# Patient Record
Sex: Female | Born: 1935 | Race: White | Hispanic: No | Marital: Married | Smoking: Never smoker
Health system: Southern US, Community
[De-identification: ages and names within clinical notes are randomized; demographics above are authoritative.]

## PROBLEM LIST (undated history)

## (undated) DIAGNOSIS — R519 Headache, unspecified: Secondary | ICD-10-CM

## (undated) DIAGNOSIS — M1990 Unspecified osteoarthritis, unspecified site: Secondary | ICD-10-CM

## (undated) DIAGNOSIS — M8580 Other specified disorders of bone density and structure, unspecified site: Secondary | ICD-10-CM

## (undated) DIAGNOSIS — R51 Headache: Secondary | ICD-10-CM

## (undated) DIAGNOSIS — R011 Cardiac murmur, unspecified: Secondary | ICD-10-CM

## (undated) DIAGNOSIS — E785 Hyperlipidemia, unspecified: Secondary | ICD-10-CM

## (undated) DIAGNOSIS — N6099 Unspecified benign mammary dysplasia of unspecified breast: Secondary | ICD-10-CM

## (undated) DIAGNOSIS — M316 Other giant cell arteritis: Secondary | ICD-10-CM

## (undated) HISTORY — PX: COLONOSCOPY: SHX174

## (undated) HISTORY — DX: Unspecified osteoarthritis, unspecified site: M19.90

## (undated) HISTORY — DX: Headache, unspecified: R51.9

## (undated) HISTORY — DX: Other specified disorders of bone density and structure, unspecified site: M85.80

## (undated) HISTORY — PX: CATARACT EXTRACTION: SUR2

## (undated) HISTORY — PX: EYE SURGERY: SHX253

## (undated) HISTORY — DX: Hyperlipidemia, unspecified: E78.5

## (undated) HISTORY — PX: FRACTURE SURGERY: SHX138

## (undated) HISTORY — DX: Headache: R51

## (undated) HISTORY — PX: TOTAL KNEE ARTHROPLASTY: SHX125

## (undated) HISTORY — DX: Unspecified benign mammary dysplasia of unspecified breast: N60.99

## (undated) HISTORY — DX: Cardiac murmur, unspecified: R01.1

---

## 1960-11-06 HISTORY — PX: APPENDECTOMY: SHX54

## 1998-11-06 DIAGNOSIS — N6099 Unspecified benign mammary dysplasia of unspecified breast: Secondary | ICD-10-CM

## 1998-11-06 HISTORY — DX: Unspecified benign mammary dysplasia of unspecified breast: N60.99

## 1998-11-06 HISTORY — PX: BREAST BIOPSY: SHX20

## 1999-01-10 ENCOUNTER — Other Ambulatory Visit: Admission: RE | Admit: 1999-01-10 | Discharge: 1999-01-10 | Payer: Self-pay | Admitting: Nurse Practitioner

## 1999-02-17 ENCOUNTER — Ambulatory Visit (HOSPITAL_BASED_OUTPATIENT_CLINIC_OR_DEPARTMENT_OTHER): Admission: RE | Admit: 1999-02-17 | Discharge: 1999-02-17 | Payer: Self-pay

## 1999-11-07 HISTORY — PX: JOINT REPLACEMENT: SHX530

## 2000-02-23 ENCOUNTER — Other Ambulatory Visit: Admission: RE | Admit: 2000-02-23 | Discharge: 2000-02-23 | Payer: Self-pay | Admitting: *Deleted

## 2000-08-01 ENCOUNTER — Encounter: Admission: RE | Admit: 2000-08-01 | Discharge: 2000-08-01 | Payer: Self-pay

## 2001-05-23 ENCOUNTER — Other Ambulatory Visit: Admission: RE | Admit: 2001-05-23 | Discharge: 2001-05-23 | Payer: Self-pay | Admitting: *Deleted

## 2001-07-03 ENCOUNTER — Encounter: Payer: Self-pay | Admitting: Orthopedic Surgery

## 2001-07-05 ENCOUNTER — Encounter: Payer: Self-pay | Admitting: Orthopedic Surgery

## 2001-07-06 ENCOUNTER — Inpatient Hospital Stay (HOSPITAL_COMMUNITY): Admission: RE | Admit: 2001-07-06 | Discharge: 2001-07-08 | Payer: Self-pay | Admitting: Orthopedic Surgery

## 2001-08-13 ENCOUNTER — Encounter: Admission: RE | Admit: 2001-08-13 | Discharge: 2001-08-13 | Payer: Self-pay | Admitting: *Deleted

## 2001-12-10 ENCOUNTER — Encounter: Admission: RE | Admit: 2001-12-10 | Discharge: 2001-12-10 | Payer: Self-pay | Admitting: Internal Medicine

## 2001-12-10 ENCOUNTER — Encounter: Payer: Self-pay | Admitting: Internal Medicine

## 2002-06-03 ENCOUNTER — Other Ambulatory Visit: Admission: RE | Admit: 2002-06-03 | Discharge: 2002-06-03 | Payer: Self-pay | Admitting: *Deleted

## 2002-08-14 ENCOUNTER — Encounter: Admission: RE | Admit: 2002-08-14 | Discharge: 2002-08-14 | Payer: Self-pay | Admitting: *Deleted

## 2003-03-04 ENCOUNTER — Encounter: Admission: RE | Admit: 2003-03-04 | Discharge: 2003-03-04 | Payer: Self-pay | Admitting: Internal Medicine

## 2003-03-04 ENCOUNTER — Encounter: Payer: Self-pay | Admitting: Internal Medicine

## 2003-08-07 ENCOUNTER — Other Ambulatory Visit: Admission: RE | Admit: 2003-08-07 | Discharge: 2003-08-07 | Payer: Self-pay | Admitting: *Deleted

## 2003-08-20 ENCOUNTER — Encounter: Admission: RE | Admit: 2003-08-20 | Discharge: 2003-08-20 | Payer: Self-pay | Admitting: *Deleted

## 2004-05-20 ENCOUNTER — Encounter: Admission: RE | Admit: 2004-05-20 | Discharge: 2004-05-20 | Payer: Self-pay | Admitting: Internal Medicine

## 2004-09-14 ENCOUNTER — Ambulatory Visit: Payer: Self-pay | Admitting: Internal Medicine

## 2004-09-22 ENCOUNTER — Encounter: Admission: RE | Admit: 2004-09-22 | Discharge: 2004-09-22 | Payer: Self-pay | Admitting: *Deleted

## 2004-11-10 ENCOUNTER — Ambulatory Visit: Payer: Self-pay | Admitting: Internal Medicine

## 2004-12-24 ENCOUNTER — Ambulatory Visit: Payer: Self-pay | Admitting: Family Medicine

## 2004-12-26 ENCOUNTER — Ambulatory Visit: Payer: Self-pay | Admitting: Internal Medicine

## 2005-07-21 ENCOUNTER — Ambulatory Visit: Payer: Self-pay | Admitting: Internal Medicine

## 2005-09-01 ENCOUNTER — Ambulatory Visit: Payer: Self-pay | Admitting: Internal Medicine

## 2005-11-09 ENCOUNTER — Encounter: Admission: RE | Admit: 2005-11-09 | Discharge: 2005-11-09 | Payer: Self-pay | Admitting: *Deleted

## 2005-11-30 ENCOUNTER — Encounter: Admission: RE | Admit: 2005-11-30 | Discharge: 2005-11-30 | Payer: Self-pay | Admitting: *Deleted

## 2006-03-27 ENCOUNTER — Ambulatory Visit: Payer: Self-pay | Admitting: Internal Medicine

## 2006-05-07 ENCOUNTER — Ambulatory Visit: Payer: Self-pay | Admitting: Family Medicine

## 2006-05-14 ENCOUNTER — Ambulatory Visit: Payer: Self-pay | Admitting: Internal Medicine

## 2006-06-13 ENCOUNTER — Ambulatory Visit: Payer: Self-pay | Admitting: Internal Medicine

## 2006-06-20 ENCOUNTER — Ambulatory Visit: Payer: Self-pay | Admitting: Internal Medicine

## 2006-08-17 ENCOUNTER — Ambulatory Visit: Payer: Self-pay | Admitting: Internal Medicine

## 2006-08-27 ENCOUNTER — Ambulatory Visit: Payer: Self-pay | Admitting: Cardiovascular Disease

## 2006-08-30 ENCOUNTER — Ambulatory Visit: Payer: Self-pay | Admitting: Internal Medicine

## 2006-09-11 ENCOUNTER — Ambulatory Visit: Payer: Self-pay | Admitting: Internal Medicine

## 2006-09-14 ENCOUNTER — Ambulatory Visit: Payer: Self-pay | Admitting: Internal Medicine

## 2006-11-16 ENCOUNTER — Encounter: Admission: RE | Admit: 2006-11-16 | Discharge: 2006-11-16 | Payer: Self-pay | Admitting: *Deleted

## 2006-11-23 ENCOUNTER — Encounter: Admission: RE | Admit: 2006-11-23 | Discharge: 2006-11-23 | Payer: Self-pay | Admitting: Obstetrics & Gynecology

## 2007-11-07 HISTORY — PX: ELBOW SURGERY: SHX618

## 2007-12-17 ENCOUNTER — Encounter: Admission: RE | Admit: 2007-12-17 | Discharge: 2007-12-17 | Payer: Self-pay | Admitting: Obstetrics & Gynecology

## 2007-12-24 ENCOUNTER — Encounter: Admission: RE | Admit: 2007-12-24 | Discharge: 2007-12-24 | Payer: Self-pay | Admitting: Obstetrics & Gynecology

## 2008-08-16 ENCOUNTER — Emergency Department (HOSPITAL_COMMUNITY): Admission: EM | Admit: 2008-08-16 | Discharge: 2008-08-16 | Payer: Self-pay | Admitting: Emergency Medicine

## 2008-08-17 ENCOUNTER — Encounter: Admission: RE | Admit: 2008-08-17 | Discharge: 2008-08-17 | Payer: Self-pay | Admitting: Orthopedic Surgery

## 2008-08-19 ENCOUNTER — Ambulatory Visit (HOSPITAL_COMMUNITY): Admission: RE | Admit: 2008-08-19 | Discharge: 2008-08-19 | Payer: Self-pay | Admitting: Orthopedic Surgery

## 2008-12-23 ENCOUNTER — Encounter: Admission: RE | Admit: 2008-12-23 | Discharge: 2008-12-23 | Payer: Self-pay | Admitting: Obstetrics & Gynecology

## 2009-01-06 ENCOUNTER — Telehealth (INDEPENDENT_AMBULATORY_CARE_PROVIDER_SITE_OTHER): Payer: Self-pay | Admitting: *Deleted

## 2009-04-19 ENCOUNTER — Ambulatory Visit: Payer: Self-pay | Admitting: Internal Medicine

## 2009-04-19 DIAGNOSIS — M8580 Other specified disorders of bone density and structure, unspecified site: Secondary | ICD-10-CM

## 2009-04-19 DIAGNOSIS — E785 Hyperlipidemia, unspecified: Secondary | ICD-10-CM | POA: Insufficient documentation

## 2009-04-19 DIAGNOSIS — R030 Elevated blood-pressure reading, without diagnosis of hypertension: Secondary | ICD-10-CM | POA: Insufficient documentation

## 2009-04-19 DIAGNOSIS — M810 Age-related osteoporosis without current pathological fracture: Secondary | ICD-10-CM | POA: Insufficient documentation

## 2009-04-20 ENCOUNTER — Encounter (INDEPENDENT_AMBULATORY_CARE_PROVIDER_SITE_OTHER): Payer: Self-pay | Admitting: *Deleted

## 2009-09-06 ENCOUNTER — Ambulatory Visit: Payer: Self-pay | Admitting: Internal Medicine

## 2009-12-17 ENCOUNTER — Ambulatory Visit: Payer: Self-pay | Admitting: Family

## 2010-01-19 ENCOUNTER — Encounter: Admission: RE | Admit: 2010-01-19 | Discharge: 2010-01-19 | Payer: Self-pay | Admitting: Obstetrics & Gynecology

## 2010-01-28 ENCOUNTER — Ambulatory Visit: Payer: Self-pay | Admitting: Internal Medicine

## 2010-01-28 DIAGNOSIS — M79609 Pain in unspecified limb: Secondary | ICD-10-CM | POA: Insufficient documentation

## 2010-01-28 LAB — CONVERTED CEMR LAB: Uric Acid, Serum: 3.4 mg/dL (ref 2.4–7.0)

## 2010-06-01 ENCOUNTER — Ambulatory Visit: Payer: Self-pay | Admitting: Internal Medicine

## 2010-06-01 DIAGNOSIS — J019 Acute sinusitis, unspecified: Secondary | ICD-10-CM | POA: Insufficient documentation

## 2010-08-24 ENCOUNTER — Telehealth: Payer: Self-pay | Admitting: Internal Medicine

## 2010-10-19 ENCOUNTER — Telehealth (INDEPENDENT_AMBULATORY_CARE_PROVIDER_SITE_OTHER): Payer: Self-pay | Admitting: *Deleted

## 2010-12-04 LAB — CONVERTED CEMR LAB
BUN: 20 mg/dL (ref 6–23)
Creatinine, Ser: 0.7 mg/dL (ref 0.4–1.2)
Hgb A1c MFr Bld: 5.7 % (ref 4.6–6.5)
Potassium: 4.1 meq/L (ref 3.5–5.1)

## 2010-12-08 NOTE — Assessment & Plan Note (Signed)
Summary: cpx/alr   Vital Signs:  Patient profile:   77 year old female Height:      61.5 inches Weight:      114.2 pounds BMI:     21.31 Temp:     97.5 degrees F oral Pulse rate:   75 / minute Resp:     14 per minute BP sitting:   146 / 72  (left arm) Cuff size:   regular  Vitals Entered By: Shonna Chock (April 19, 2009 9:36 AM) CC: YEARLY CHECK-UP AND FASTING LABS    CC:  YEARLY CHECK-UP AND FASTING LABS .  History of Present Illness: She is here for extensive exam; she does not know if insurance covers CPX. It covers Gyn every other year. She is asymptomatic , but interested in survelliance/ prevention.  Preventive Screening-Counseling & Management  Alcohol-Tobacco     Smoking Status: never  Caffeine-Diet-Exercise     Does Patient Exercise: yes  Allergies (verified): 1)  ! Codeine  Past History:  Past Medical History: MV click 2000; Pre cancerous breast lesion 2000 Osteopenia, Dr Seymour Bars Hyperlipidemia with high HDL  Past Surgical History: Breast biopsy 2000; G4 P4; Hemi knee replacement  bilat 2002, Dr Luiz Blare; Colonoscopy 2002, due 2012  Family History: Father: CAD Mother: breat CA, MI @ 62,S/P CABG in 49s, osteoporosis, AODM Siblings: sister CABG, cervical CA; M aunts  (2) & MGM AODM  Social History: Retired Married Never Smoked Alcohol use-yes: socially Regular exercise-yes: walks 3 mpd over symptomsSmoking Status:  never Does Patient Exercise:  yes  Review of Systems  The patient denies anorexia, fever, weight loss, weight gain, vision loss, decreased hearing, hoarseness, chest pain, syncope, dyspnea on exertion, peripheral edema, prolonged cough, headaches, hemoptysis, abdominal pain, melena, hematochezia, severe indigestion/heartburn, hematuria, incontinence, suspicious skin lesions, depression, unusual weight change, abnormal bleeding, enlarged lymph nodes, and angioedema.   Endo:  Denies excessive hunger, excessive thirst, and excessive urination;  No polyphagia, dipsia, or polyuria. No temp intolerance.Marland Kitchen  Physical Exam  General:  Appears younger than age,well-nourished,in no acute distress; alert,appropriate and cooperative throughout examination Neck:  No deformities, masses, or tenderness noted. Thyroid small & slightly asymmetric w/o nodules Lungs:  Normal respiratory effort, chest expands symmetrically. Lungs are clear to auscultation, no crackles or wheezes. Heart:  Normal rate and regular rhythm. S1 and S2 normal without gallop, murmur, click, rub. S4 with slurring. BP recheck = 130/80. Abdomen:  Bowel sounds positive,abdomen soft and non-tender without masses, organomegaly or hernias noted. Aorta palpable ; no AAA Genitalia:  Dr Seymour Bars Msk:  No deformity or scoliosis noted of thoracic or lumbar spine.   Pulses:  R and L carotid,radial,dorsalis pedis and posterior tibial pulses are full and equal bilaterally Extremities:  No clubbing, cyanosis, edema. Crepitus of knees  Neurologic:  alert & oriented X3 and DTRs symmetrical and normal.   Skin:  Intact without suspicious lesions or rashes Cervical Nodes:  No lymphadenopathy noted Axillary Nodes:  No palpable lymphadenopathy Psych:  memory intact for recent and remote, normally interactive, and good eye contact.     Impression & Recommendations:  Problem # 1:  HYPERLIPIDEMIA (ICD-272.4)  Orders: Venipuncture (19147) T- * Misc. Laboratory test [phone])  Problem # 2:  OSTEOPENIA (ICD-733.90) as per Dr Seymour Bars Her updated medication list for this problem includes:    Boniva 150 Mg Tabs (Ibandronate sodium) .Marland Kitchen... 1 by mouth qmonth    Evista 60 Mg Tabs (Raloxifene hcl) .Marland Kitchen... 1 by mouth once daily  Orders: Venipuncture (21308)  Problem #  3:  ELEVATED BLOOD PRESSURE WITHOUT DIAGNOSIS OF HYPERTENSION (ICD-796.2)  Orders: EKG w/ Interpretation (93000) Venipuncture (19147) TLB-Creatinine, Blood (82565-CREA) TLB-Potassium (K+) (84132-K) TLB-BUN (Urea Nitrogen)  (84520-BUN)  Problem # 4:  FAMILY HISTORY OF ISCHEMIC HEART DISEASE (ICD-V17.3)  Orders: Venipuncture (82956) T- * Misc. Laboratory test [phone])  Problem # 5:  FAMILY HISTORY OF DIABETES MELLITUS (ICD-V18.0)  Orders: Venipuncture (65784) TLB-A1C / Hgb A1C (Glycohemoglobin) (83036-A1C)  Complete Medication List: 1)  Boniva 150 Mg Tabs (Ibandronate sodium) .Marland Kitchen.. 1 by mouth qmonth 2)  Evista 60 Mg Tabs (Raloxifene hcl) .Marland Kitchen.. 1 by mouth once daily  Patient Instructions: 1)  Check your Blood Pressure regularly. If it is above: 135/85 ON AVERAGE  you should make an appointment.

## 2010-12-08 NOTE — Progress Notes (Signed)
Summary: Sleep Concerns  Phone Note Call from Patient Call back at Home Phone [phone]   Caller: Patient Summary of Call: Patient having trouble falling and staying asleep and at times takes her Husbands Lorazepam to help with symptoms. Patient would like to know if Dr.Hopper will ok for her to have rx of her own.  Dr.Hopper please advise [facility]   Shonna Chock CMA  August 24, 2010 1:17 PM   Follow-up for Phone Call         Lorazepam 0.5 mg at bedtime as needed #30 Follow-up by: Marga Melnick MD,  August 24, 2010 2:14 PM  Additional Follow-up for Phone Call Additional follow up Details #1::        Patient spouse notified. Additional Follow-up by: Lucious Groves CMA,  August 24, 2010 2:59 PM    New/Updated Medications: LORAZEPAM 0.5 MG TABS (LORAZEPAM) 1 by mouth at bedtime as needed Prescriptions: LORAZEPAM 0.5 MG TABS (LORAZEPAM) 1 by mouth at bedtime as needed  #30 x 0   Entered by:   Lucious Groves CMA   Authorized by:   Marga Melnick MD   Signed by:   Lucious Groves CMA on 08/24/2010   Method used:   Telephoned to ...       [facility] ([facility]       [address]       [place], [place]  [address]       Ph: [phone] or [phone]       Fax: [phone]   RxID:   [id number]

## 2010-12-08 NOTE — Assessment & Plan Note (Signed)
Summary: cough/kn   Vital Signs:  Patient profile:   77 year old female Weight:      116.4 pounds Temp:     97.7 degrees F oral Pulse rate:   60 / minute Resp:     16 per minute BP sitting:   126 / 70  (left arm) Cuff size:   regular  Vitals Entered By: Shonna Chock CMA (June 01, 2010 12:24 PM) CC: Cough x several weeks, productive at times (yellow)   CC:  Cough x several weeks and productive at times (yellow).  History of Present Illness:  Cough      This is a 77 year old woman who presents with Cough over 4-6 weeks.  The patient reports productive cough, but denies pleuritic chest pain, shortness of breath, wheezing, exertional dyspnea, fever, hemoptysis, and malaise.  Associated symtpoms include nasal congestion with some purulence. No facial pain or purulence.  The patient denies the following symptoms: cold/URI symptoms, sore throat, and acid reflux symptoms.  Ineffective prior treatments have included OTC cough medication.    Current Medications (verified): 1)  Boniva 150 Mg Tabs (Ibandronate Sodium) .Marland Kitchen.. 1 By Mouth Qmonth 2)  Evista 60 Mg Tabs (Raloxifene Hcl) .Marland Kitchen.. 1 By Mouth Once Daily  Allergies: 1)  ! Codeine  Physical Exam  General:  Thin,in no acute distress; alert,appropriate and cooperative throughout examination Ears:  External ear exam shows no significant lesions or deformities.  Otoscopic examination reveals clear canals, tympanic membranes are intact bilaterally without bulging, retraction, inflammation or discharge. Hearing is grossly normal bilaterally. Nose:  External nasal examination shows no deformity or inflammation. Nasal mucosa are pink and moist without lesions or exudates. Mouth:  Oral mucosa and oropharynx without lesions or exudates.  Teeth in good repair. Lungs:  Normal respiratory effort, chest expands symmetrically. Lungs are clear to auscultation, no crackles or wheezes. Heart:  Normal rate and regular rhythm. S1 and S2 normal without gallop,  murmur, click, rub . Cervical Nodes:  No lymphadenopathy noted Axillary Nodes:  No palpable lymphadenopathy   Impression & Recommendations:  Problem # 1:  BRONCHITIS-ACUTE (ICD-466.0)  Her updated medication list for this problem includes:    Benzonatate 100 Mg Caps (Benzonatate) .Marland Kitchen... 1 every 6-8 hrs as needed cough    Amoxicillin-pot Clavulanate 500-125 Mg Tabs (Amoxicillin-pot clavulanate) .Marland Kitchen... 1 every 12 hrs with a meal  Orders: Prescription Created Electronically [phone])  Problem # 2:  SINUSITIS- ACUTE-NOS (ICD-461.9)  Her updated medication list for this problem includes:    Benzonatate 100 Mg Caps (Benzonatate) .Marland Kitchen... 1 every 6-8 hrs as needed cough    Amoxicillin-pot Clavulanate 500-125 Mg Tabs (Amoxicillin-pot clavulanate) .Marland Kitchen... 1 every 12 hrs with a meal  Orders: Prescription Created Electronically [phone])  Complete Medication List: 1)  Boniva 150 Mg Tabs (Ibandronate sodium) .Marland Kitchen.. 1 by mouth qmonth 2)  Evista 60 Mg Tabs (Raloxifene hcl) .Marland Kitchen.. 1 by mouth once daily 3)  Benzonatate 100 Mg Caps (Benzonatate) .Marland Kitchen.. 1 every 6-8 hrs as needed cough 4)  Amoxicillin-pot Clavulanate 500-125 Mg Tabs (Amoxicillin-pot clavulanate) .Marland Kitchen.. 1 every 12 hrs with a meal  Patient Instructions: 1)  Neti pot once daily until sinuses are clear . Drink as much fluid as you can tolerate for the next few days. Prescriptions: AMOXICILLIN-POT CLAVULANATE 500-125 MG TABS (AMOXICILLIN-POT CLAVULANATE) 1 every 12 hrs with a meal  #20 x 0   Entered and Authorized by:   Marga Melnick MD   Signed by:   Marga Melnick MD on 06/01/2010  Method used:   Faxed to ...       [id number] ([facility]       [address]       [place], [place]  [address]       Ph: [phone] or [phone]       Fax: [phone]   RxID:   [id number] BENZONATATE 100 MG CAPS (BENZONATATE) 1 every 6-8 hrs as needed cough  #15 x 0   Entered and Authorized by:   Marga Melnick MD   Signed by:   Marga Melnick MD on  06/01/2010   Method used:   Faxed to ...       [id number] (retail)       [address]       [place], [place]  [address]       Ph: [phone] or [phone]       Fax: [phone]   RxID:   [id number]

## 2010-12-08 NOTE — Assessment & Plan Note (Signed)
Summary: bit by a cat/swollen hand/kdc   Vital Signs:  Patient profile:   77 year old female Weight:      118.4 pounds Temp:     98.6 degrees F oral BP sitting:   120 / 70  (left arm)  Vitals Entered By: Doristine Devoid (December 17, 2009 10:14 AM) CC: L hand swollen and red after cat bite    CC:  L hand swollen and red after cat bite .  History of Present Illness: Danielle Mcgee is a 77 year old female who presents today following a cat bite yesterday.  She notes that the bite was deep and painful.  She now has developed redness of hter left hand with associated swelling.   Denies associated fever.    Allergies: 1)  ! Codeine  Physical Exam  General:  Well-developed,well-nourished,in no acute distress; alert,appropriate and cooperative throughout examination Extremities:  left dorsal hand with swelling and puncture bite wound. Swelling extends to wrist with some minimal streaking just above,  no discharge.   Impression & Recommendations:  Problem # 1:  INFECTION, SKIN AND SOFT TISSUE (ICD-686.9) Assessment New will plan to treat with augmentin for cat bite and cellulitus.  I instructed patient to follow up on Monday and to go to ER if symptoms worsen over the weekend.  She tells me she is too busy to come in on Monday.  I advised patient that it is my recommendation that she be seen that day.    Complete Medication List: 1)  Boniva 150 Mg Tabs (Ibandronate sodium) .Marland Kitchen.. 1 by mouth qmonth 2)  Evista 60 Mg Tabs (Raloxifene hcl) .Marland Kitchen.. 1 by mouth once daily 3)  Amoxicillin-pot Clavulanate 500-125 Mg Tabs (Amoxicillin-pot clavulanate) .Marland Kitchen.. 1q 12 hrs with a meal 4)  Benzonatate 100 Mg Caps (Benzonatate) .Marland Kitchen.. 1 q 6-8 hrs as needed cough 5)  Augmentin 875-125 Mg Tabs (Amoxicillin-pot clavulanate) .... One tablet by mouth two times a day x 10 days  Patient Instructions: 1)  Please follow up on Monday 2/14. 2)  If increased swelling/redness/pain over weekend go to  ER. Prescriptions: AUGMENTIN 875-125 MG TABS (AMOXICILLIN-POT CLAVULANATE) one tablet by mouth two times a day x 10 days  #20 x 0   Entered and Authorized by:   Lemont Fillers FNP   Signed by:   Lemont Fillers FNP on 12/17/2009   Method used:   Electronically to        [facility] ([facility]       [address]       [place], [place]  [address]       Ph: [phone] or [phone]       Fax: [phone]   RxID:   [id number]

## 2010-12-08 NOTE — Letter (Signed)
Summary: Results Follow up Letter  [facility]  [address] [place], [place] [address]   Phone: [phone]  Fax: [phone]    04/20/2009 MRN: [id number]  ANNA Wass [address] [place], [place]  [address]  Dear Ms. Wagler,  The following are the results of your recent test(s):  Test         Result    Pap Smear:        Normal _____  Not Normal _____ Comments: ______________________________________________________ Cholesterol: LDL(Bad cholesterol):         Your goal is less than:         HDL (Good cholesterol):       Your goal is more than: Comments:  ______________________________________________________ Mammogram:        Normal _____  Not Normal _____ Comments:  ___________________________________________________________________ Hemoccult:        Normal _____  Not normal _______ Comments:    _____________________________________________________________________ Other Tests: PLEASE SEE COPY OF LABS FROM 04/19/09 AND COMMENTS    We routinely do not discuss normal results over the telephone.  If you desire a copy of the results, or you have any questions about this information we can discuss them at your next office visit.   Sincerely,

## 2010-12-08 NOTE — Assessment & Plan Note (Signed)
Summary: COLD//PH   Vital Signs:  Patient profile:   77 year old female Weight:      116.4 pounds Temp:     97.4 degrees F oral Pulse rate:   72 / minute Resp:     15 per minute BP sitting:   110 / 68  (left arm) Cuff size:   regular  Vitals Entered By: Shonna Chock (September 06, 2009 10:59 AM) CC: Cold symptoms since last Tuesday Comments REVIEWED MED LIST, PATIENT AGREED DOSE AND INSTRUCTION CORRECT    CC:  Cold symptoms since last Tuesday.  History of Present Illness: Onset 08/31/2009 as ST & rhinitis ; symptoms controlled with Tylenol Severe Cold. Now" ascending " into sinuses.  Allergies: 1)  ! Codeine  Review of Systems General:  Denies chills, fever, and sweats. ENT:  Complains of nasal congestion and sinus pressure; denies ear discharge, earache, and postnasal drainage; No frontal headache; scant yellow/green nasal secretions& severe facial pain. Resp:  Complains of cough and sputum productive; Scant yellow / green secretions (< from chest than head).  Physical Exam  General:  Thin,in no acute distress; alert,appropriate and cooperative throughout examination Ears:  External ear exam shows no significant lesions or deformities.  Otoscopic examination reveals clear canals, tympanic membranes are intact bilaterally without bulging, retraction, inflammation or discharge. Hearing is grossly normal bilaterally. Nose:  External nasal examination shows no deformity or inflammation. Nasal mucosa are pink and moist without lesions or exudates. Septal dislocation Mouth:  Oral mucosa and oropharynx without lesions or exudates.   Hoarse Lungs:  Normal respiratory effort, chest expands symmetrically. Lungs are clear to auscultation, no crackles or wheezes. Cervical Nodes:  Minor LA on L Axillary Nodes:  No palpable lymphadenopathy   Impression & Recommendations:  Problem # 1:  SINUSITIS- ACUTE-NOS (ICD-461.9)  Her updated medication list for this problem includes:  Amoxicillin-pot Clavulanate 500-125 Mg Tabs (Amoxicillin-pot clavulanate) .Marland Kitchen... 1q 12 hrs with a meal    Benzonatate 100 Mg Caps (Benzonatate) .Marland Kitchen... 1 q 6-8 hrs as needed cough  Problem # 2:  BRONCHITIS-ACUTE (ICD-466.0)  Her updated medication list for this problem includes:    Amoxicillin-pot Clavulanate 500-125 Mg Tabs (Amoxicillin-pot clavulanate) .Marland Kitchen... 1q 12 hrs with a meal    Benzonatate 100 Mg Caps (Benzonatate) .Marland Kitchen... 1 q 6-8 hrs as needed cough  Complete Medication List: 1)  Boniva 150 Mg Tabs (Ibandronate sodium) .Marland Kitchen.. 1 by mouth qmonth 2)  Evista 60 Mg Tabs (Raloxifene hcl) .Marland Kitchen.. 1 by mouth once daily 3)  Amoxicillin-pot Clavulanate 500-125 Mg Tabs (Amoxicillin-pot clavulanate) .Marland Kitchen.. 1q 12 hrs with a meal 4)  Benzonatate 100 Mg Caps (Benzonatate) .Marland Kitchen.. 1 q 6-8 hrs as needed cough  Patient Instructions: 1)  Neti pot once daily until clear. 2)  Drink as much fluid as you can tolerate for the next few days. Prescriptions: BENZONATATE 100 MG CAPS (BENZONATATE) 1 q 6-8 hrs as needed cough  #21 x 0   Entered and Authorized by:   Marga Melnick MD   Signed by:   Marga Melnick MD on 09/06/2009   Method used:   Print then Give to Patient   RxID:   [id number] AMOXICILLIN-POT CLAVULANATE 500-125 MG TABS (AMOXICILLIN-POT CLAVULANATE) 1q 12 hrs WITH a MEAL  #20 x 0   Entered and Authorized by:   Marga Melnick MD   Signed by:   Marga Melnick MD on 09/06/2009   Method used:   Print then Give to Patient   RxID:   [id number]

## 2010-12-08 NOTE — Progress Notes (Signed)
Summary: Lorazepam refill  Phone Note Refill Request Message from:  Fax from Pharmacy on October 19, 2010 4:38 PM  Refills Requested: Medication #1:  LORAZEPAM 0.5 mg tab   Last Refilled: 08/24/2010   Notes: Take 1 tablet at bedtime as needed [facility] [address], [place], [place]  phone [phone]   fax - [phone]  qty = 30  Next Appointment Scheduled: none Initial call taken by: Jerolyn Shin,  October 19, 2010 4:52 PM    New/Updated Medications: LORAZEPAM 0.5 MG TABS (LORAZEPAM) 1 by mouth at bedtime as needed Prescriptions: LORAZEPAM 0.5 MG TABS (LORAZEPAM) 1 by mouth at bedtime as needed  #30 x 0   Entered by:   Shonna Chock CMA   Authorized by:   Marga Melnick MD   Signed by:   Shonna Chock CMA on 10/20/2010   Method used:   Printed then faxed to ...       [facility] ([facility]       [address]       [place], [place]  [address]       Ph: [phone] or [phone]       Fax: [phone]   RxID:   [id number]

## 2010-12-08 NOTE — Assessment & Plan Note (Signed)
Summary: PAIN IN HAND RIGHT HAND/KDC   Vital Signs:  Patient profile:   77 year old female Weight:      119 pounds BMI:     22.20 Temp:     97.4 degrees F oral Pulse rate:   76 / minute Resp:     15 per minute BP sitting:   120 / 62  (left arm) Cuff size:   regular  Vitals Entered By: Shonna Chock (January 28, 2010 10:29 AM) CC: Pain in right hand  Comments REVIEWED MED LIST, PATIENT AGREED DOSE AND INSTRUCTION CORRECT    CC:  Pain in right hand .  History of Present Illness: Intermittent "ping" R thumb PIP joint over 3 past months; soreness of whole  L thumb  at bedtime 01/26/2010. Overnight severe pain evolved ; Aleve helped pain. Pressure over R lateral elbow & @ "snuff box " increases pain as does writing or squeezing . No PMH of injury to this area  Allergies: 1)  ! Codeine  Review of Systems MS:  See HPI; Denies joint redness, joint swelling, and muscle weakness.  Physical Exam  General:  in no acute distress; alert,appropriate and cooperative throughout examination Extremities:  Pain to compression over R anatomical snuff box ; Tinel's testing caused pain in same distribution.Minor  OA digit changes Neurologic:  DTRs symmetrical and normal.   Skin:  Intact without suspicious lesions or rashes Axillary Nodes:  No palpable lymphadenopathy; no epitrochlear LA   Impression & Recommendations:  Problem # 1:  HAND PAIN, RIGHT (ICD-729.5)  R anatomical snuff box  Orders: T-Hand Right 3 views (73130TC) Misc. Referral (Misc. Ref) Venipuncture (04540) TLB-Uric Acid, Blood (84550-URIC)  Complete Medication List: 1)  Boniva 150 Mg Tabs (Ibandronate sodium) .Marland Kitchen.. 1 by mouth qmonth 2)  Evista 60 Mg Tabs (Raloxifene hcl) .Marland Kitchen.. 1 by mouth once daily  Patient Instructions: 1)  Aleve 1-2 every 8-12 hrs with food as needed.

## 2010-12-08 NOTE — Progress Notes (Signed)
Summary: ref to boniva and evista //hop see  Phone Note Call from Patient Call back at Home Phone [phone]   Caller: Patient Summary of Call: Hop  pt called  TO SAY SHE SEES HER GYN REGULARY And had her bone density a couple of weeks ago  12/23/08  and mamogram Dr Verita Schneiders she said she is going to see her next week . Hop we have been fillig pt Evista and Boniva for her, but pt has not seen you in quite some time, 2007 to be exact,  we have been filling #1 with  msg needs office vis.it do you recommend pt get her rx from her gyn for this med.? pt says she has not seen you because she has not been sick. Hop I did explain to p since she sees her gyn regulary and reports go to Dr Seymour Bars, it would be good patient care to get her rx from her gyn  PLEASE ADVISE ON THIS RECOMMENDATION Initial call taken by: Kandice Hams,  January 06, 2009 10:07 AM  Follow-up for Phone Call        PER dR hOPPER FOR GOOD STANDARD OF CARE PT WOULD NEED TO HAVE AN OV OR HAVE HER GYN FILL RX, PT AGREED .Kandice Hams  January 06, 2009 10:14 AM  Follow-up by: Kandice Hams,  January 06, 2009 10:14 AM

## 2010-12-23 ENCOUNTER — Telehealth (INDEPENDENT_AMBULATORY_CARE_PROVIDER_SITE_OTHER): Payer: Self-pay | Admitting: *Deleted

## 2010-12-28 NOTE — Progress Notes (Signed)
Summary: refill  Phone Note Refill Request Message from:  Fax from Pharmacy on December 23, 2010 8:48 AM  Refills Requested: Medication #1:  LORAZEPAM 0.5 MG TABS 1 by mouth at bedtime as needed. [facility] [address] fax [phone]  Initial call taken by: Okey Regal [place],  December 23, 2010 8:48 AM    Prescriptions: LORAZEPAM 0.5 MG TABS (LORAZEPAM) 1 by mouth at bedtime as needed  #30 x 0   Entered by:   Shonna Chock CMA   Authorized by:   Marga Melnick MD   Signed by:   Shonna Chock CMA on 12/23/2010   Method used:   Printed then faxed to ...       [facility]       [address]       [place], [place]  [address]       Ph: [phone] or [phone]       Fax: [phone]   RxID:   [id number]

## 2011-01-03 ENCOUNTER — Other Ambulatory Visit: Payer: Self-pay | Admitting: Obstetrics & Gynecology

## 2011-01-03 DIAGNOSIS — Z1231 Encounter for screening mammogram for malignant neoplasm of breast: Secondary | ICD-10-CM

## 2011-01-25 ENCOUNTER — Ambulatory Visit
Admission: RE | Admit: 2011-01-25 | Discharge: 2011-01-25 | Disposition: A | Discharge status: Home / Self Care | Payer: Medicare Other | Source: Ambulatory Visit | Attending: Obstetrics & Gynecology | Admitting: Obstetrics & Gynecology

## 2011-01-25 DIAGNOSIS — Z1231 Encounter for screening mammogram for malignant neoplasm of breast: Secondary | ICD-10-CM

## 2011-01-27 ENCOUNTER — Other Ambulatory Visit: Payer: Self-pay | Admitting: Obstetrics & Gynecology

## 2011-01-27 DIAGNOSIS — R928 Other abnormal and inconclusive findings on diagnostic imaging of breast: Secondary | ICD-10-CM

## 2011-02-01 ENCOUNTER — Other Ambulatory Visit: Payer: Self-pay | Admitting: Internal Medicine

## 2011-02-01 MED ORDER — LORAZEPAM 0.5 MG PO TABS: ORAL_TABLET | ORAL | Status: DC

## 2011-02-03 ENCOUNTER — Ambulatory Visit
Admission: RE | Admit: 2011-02-03 | Discharge: 2011-02-03 | Disposition: A | Discharge status: Home / Self Care | Payer: Medicare Other | Source: Ambulatory Visit | Attending: Obstetrics & Gynecology | Admitting: Obstetrics & Gynecology

## 2011-02-03 ENCOUNTER — Other Ambulatory Visit: Payer: PRIVATE HEALTH INSURANCE

## 2011-02-03 DIAGNOSIS — R928 Other abnormal and inconclusive findings on diagnostic imaging of breast: Secondary | ICD-10-CM

## 2011-03-21 NOTE — Op Note (Signed)
Danielle Mcgee, Danielle Mcgee                  ACCOUNT NO.:  [id number]   MEDICAL RECORD NO.:  [id number]          PATIENT TYPE:  AMB   LOCATION:  SDS                          FACILITY:  [facility]   PHYSICIAN:  Harvie Junior, M.D.   DATE OF BIRTH:  07-01-1930   DATE OF PROCEDURE:  08/19/2008  DATE OF DISCHARGE:  08/19/2008                               OPERATIVE REPORT   PREOPERATIVE DIAGNOSIS:  Comminuted olecranon fracture, left.   POSTOPERATIVE DIAGNOSIS:  Comminuted olecranon fracture, left.   PRINCIPAL PROCEDURE:  Open reduction and internal fixation of left  comminuted olecranon fracture.   SURGEON:  Harvie Junior, MD   ASSISTANT:  Marshia Ly, PA   ANESTHESIA:  General.   BRIEF HISTORY:  Ms. Lozada is a 77 year old female with a history of  having fallen on her left elbow.  She suffered comminuted intra-  articular olecranon fracture with a displaced proximal piece.  I talked  to her about treatment options, CT scan was obtained, which showed that  the comminutions of intra-articular, felt that the most appropriate  course of action will be open reduction and internal fixation.  She was  brought to the operating room for this procedure.   PROCEDURE IN DETAIL:  The patient was brought to the operating room,  after adequate anesthesia obtained with general anesthetic the patient  was placed supine on the operating table.  The left arm was prepped and  draped in usual sterile fashion.  Following this, the arm was  exsanguinated and blood pressure inflated to 50 mmHg.  Following this, a  linear incision was made over the posterior aspect of the elbow,  subcutaneous tissue was taken down to the level of the triceps piece of  the triceps was identified.  It was anatomically reduced on the  posterior part and two 00.62 K-wires were advanced down the shaft and  hammered into place.  At this point, a small rent was made in the ulnar  shaft and a #2 FiberWire was passed through this.  A  figure-of-eight  tension band was used underneath the folded limbs of the 00.62 K-wires.  Once this was done, the fluoro images were obtained, which showed that  we had a near anatomic reduction in the articular surface that the pins  were down the central portion of the shaft.  At this point, the wound  was copiously and thoroughly irrigated and suctioned dry.  The fascia  was closed over the tension band and the skin was closed with 0 and 2-0  Vicryl skin staples.  Sterile compression dressing was applied as well  as a posterior splint.  The patient was taken to recovery room and she  was noted to be in a satisfactory condition.  Estimated blood loss at  the procedure was none.      Harvie Junior, M.D.  Electronically Signed    JLG/MEDQ  D:  08/19/2008  T:  08/20/2008  Job:  [id number]

## 2011-03-24 NOTE — Procedures (Signed)
. [facility]  Patient:    Danielle Mcgee, Danielle Mcgee Visit Number: [id number] MRN: [id number]          Service Type: DSU Location: RCRM 2550 05 Attending Physician:  Milly Jakob Dictated by:   Guadalupe Maple, M.D. Proc. Date: 07/05/01 Admit Date:  07/05/2001                             Procedure Report  PROCEDURE:  Lumbar epidural catheter replacement for postoperative pain control.  INDICATIONS:  [name] is a 77 year old white female with a history of degenerative joint disease of the knees who underwent a bilateral knee arthroplasty by Dr. Luiz Blare under general anesthesia.  Prior to the procedure, Dr. Sheldon Silvan discussed the possibility of postoperative epidural pain control with the patient and she agreed to this form of pain control following surgery.  DESCRIPTION OF PROCEDURE:  Upon completion of the procedure, while the patient was still under general endotracheal anesthesia, she was turned to the left lateral decubitus position.  Her low back was prepped and draped sterilely. The L3-4 interspace was entered in the midline using an 18-gauge Tuohy needle. One pass of the needle was required to enter the epidural space using the ______ technique with air.  Aspiration of the needle was negative for blood or CSF and 10 cc of preserved free saline, which was added to 50 mg of 1% lidocaine, was injected through the needle without difficulty.  A catheter was ______ 3-4 cm through the epidural space.  The needle was removed without difficulty and the catheter was tapped securely in place.  The patient was subsequently returned to the supine position, extubated, and brought to the recovery room in stable condition.  She will then be begun on an epidural fentanyl and Marcaine fusion and followed on a daily basis by the anesthesia service.  She tolerated the procedure without apparent complications. Dictated by:   Guadalupe Maple, M.D. Attending  Physician:  Milly Jakob DD:  07/06/01 TD:  07/06/01 Job: [id number]

## 2011-03-24 NOTE — Discharge Summary (Signed)
[place]  Patient:    Danielle Mcgee, Danielle Mcgee Visit Number: [id number] MRN: [id number]          Service Type: MED Location: 5000 [id number] [id number] Attending Physician:  Milly Jakob Dictated by:   Currie Paris Thedore Mins. Admit Date:  07/05/2001 Discharge Date: 07/08/2001   CC:         Titus Dubin. Alwyn Ren, M.D. [facility]   Discharge Summary  ADMISSION DIAGNOSES: 1. Medial compartment degenerative joint disease, left knee. 2. Medial compartment degenerative joint disease, right knee.  DISCHARGE DIAGNOSES: 1. Medial compartment degenerative joint disease, left knee. 2. Medial compartment degenerative joint disease, right knee.  PROCEDURES IN HOSPITAL: 1. Left knee diagnostic arthroscopy with unicompartmental knee replacement. 2. Right knee diagnostic arthroscopy with unicompartmental knee replacement.  CONSULTATIONS:  None.  HISTORY OF PRESENT ILLNESS:  Danielle Mcgee is a pleasant, active 77 year old female who was followed for quite some time in our office for bilateral knee pain.  She complains of medial knee pain in both knees with ambulation. She has gotten to the point where she is unable to be active as she desires because of her knee pain. She has only gotten temporary relief with cortisone injections and use of anti-inflammatory medication which she has used for quite some time. She has standing x-rays of both knees which show bone on bone in the medial compartment of both knees.  X-rays with valgus stress show that she is not significant narrowed down laterally. Since she did have night pain and pain with ambulation she was felt to be a candidate for bilateral knee arthroscopies to determine if her arthritis is localized only to the medial compartment and if so, to proceed with bilateral unicompartmental knee replacements.  She was admitted for this.  PERTINENT LABORATORY STUDIES:  EKG on July 03, 2001, showed normal sinus rhythm.  Postoperative x-rays  of the knees show anatomic alignment, status post medial joint space replacement on the right and on the left.  Hemoglobin on admission was 12.9, hematocrit 37.5, wbc 8.5, indices within normal limits.  Postoperative day #1, hemoglobin 10.3.  Postoperative day #2, hemoglobin was 8.8.  Her protime on admission was 12.1 seconds with an INR of 0.8.  PTT was 28.  CMET on admission was entirely within normal limits. Urinalysis on admission showed no abnormalities.  HOSPITAL COURSE:  The patient underwent bilateral knee arthroscopies and medial compartmental knee replacements. She tolerated the procedure well. Postoperatively she was placed on 1 g Ancef IV q.8h. x 5 doses.  She was placed on an epidural Fentanyl catheter per anesthesia immediately postoperatively. She was started on oral aspirin therapy for DVT prophylaxis. Bilateral CPM machines were used for range of motion. Physical therapy was ordered for walker ambulation, weightbearing as tolerated bilaterally with knee immobilizers.  Two drains were placed, one in each knee, postoperatively. On postop day #1, her vital signs were stable, her epidural was working well, her dressing was dry and __________ was intact distally. She was gotten out of bed with physical therapy.  Postop day #2, she was afebrile, her vital signs were stable, her hemoglobin was 8.8.  The dressings were changed on both knees and the Hemovac drains were pulled.  Her IV was discontinued as well as her Foley catheter. She was felt to need an additional day of physical therapy to get her mobilized.  On postop day #3, July 08, 2001, the patient was comfortable. She had been getting out of bed.  She was voiding  without difficulty and was taking fluids without difficulty.  Her vital signs were stable and she was afebrile.  Her bilateral knee wounds were benign, her calves were soft. She had no sign of infection.  She had excellent early range of motion of both  knees.  CONDITION ON DISCHARGE:  She was discharged home in improved condition.  DISCHARGE DIET:  She was on a regular diet.  DISCHARGE MEDICATIONS:  She will be on her usual home medications with the addition of Mepergan Fortis p.r.n. for pain and one baby aspirin a day as well as Keflex 500 mg one tablet t.i.d. x 1 week.  ACTIVITY STATUS:  She will be weightbearing as tolerated bilaterally with a walker.  FOLLOW-UP:  She will follow up with Harvie Junior, M.D., in the office in three to four days. Dictated by:   Currie Paris Thedore Mins. Attending Physician:  Milly Jakob DD:  07/17/01 TD:  07/17/01 Job: [id number]

## 2011-03-24 NOTE — Op Note (Signed)
[place]  Patient:    Danielle Mcgee, Danielle Mcgee Visit Number: [id number] MRN: [id number]          Service Type: DSU Location: RCRM 2550 05 Attending Physician:  Milly Jakob Dictated by:   Harvie Junior, M.D. Proc. Date: 07/05/01 Admit Date:  07/05/2001                             Operative Report  PREOPERATIVE DIAGNOSIS:  Bilateral degenerative joint disease, medial compartment of the knees.  POSTOPERATIVE DIAGNOSIS:  Bilateral degenerative joint disease, medial compartment of the knees.  PROCEDURES: 1. Right knee arthroscopy, diagnostic. 2. Left knee arthroscopy, diagnostic. 3. Right knee unicompartmental knee replacement with DePuy preservation    system, size 2 femur, size 3 cemented tibia. 4. Left knee unicompartmental knee replacement, size 2 femoral component,    size 3 tibial component, cemented.  SURGEON:  Harvie Junior, M.D.  ASSISTANTS:  Lubertha Basque. Jerl Santos, M.D., and Currie Paris. Thedore Mins.  ANESTHESIA:  General.  BRIEF HISTORY:  This 77 year old female with a long history, a several-year history of having had severe medial-sided knee pain.  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  We talked about knee replacement.  She felt that the type of tennis she played would be more amenable to some less invasive procedure and because of this, discussion of unicompartmental knee replacement was undertaken.  Ultimately the patient was able to make it through another year and a half or so of continued playing but ultimately had to give up tennis because of continued complaints of pain and ultimately presented for further evaluation about the possibility of unicompartmental knee replacement.  X-rays at that time showed severe medial compartment disease with bone on bone in the medial compartment.  Lateral x-ray, sunrise x-ray, and AP x-ray showed no abnormalities in the lateral or the patellofemoral compartment.   Given her advanced age, it was felt appropriate to do arthroscopy prior to any procedure to make sure that there was no injury to the articular cartilage and patellofemoral area or in the lateral compartment, and she was brought to the operating room with the idea that we would do bilateral knee arthroscopy and proceed from there as needed with either unicompartmental knee replacement or total knee replacement.  She was prepared to have a simultaneous bilateral total knee arthroplasty.  She was brought to the operating room for this procedure.  DESCRIPTION OF PROCEDURE:  The patient was brought to the operating room and after adequate anesthesia was obtained with a general anesthetic, the patient was placed supine upon the operating table.  The left leg was then prepped and draped in the usual sterile fashion.  At this point an arthroscopy was performed on the right knee.  The images were taken of the patellofemoral joint, which showed no evidence of abnormality.  Attention was turned down to the medial compartment, where there was exposed bone over a quarter-sized area .  There was exposed bone on the end of the femur.  There was a significant meniscal tear on the inboard side.  The anterior cruciate was identified and noted to be normal.  Attention was turned to the lateral compartment, where there was a tiny bit of grade 2 change on the lateral tibial plateau but not on the femoral condyle.  We flexed the knee up, as that is typically where the problems would be  in the lateral compartment in flexion.  No evidence of abnormality in the lateral femoral condyle.  The attention was then turned toward the lateral meniscus, which was normal.  At this point the knee was copiously irrigated and suctioned dry, and the camera was removed.  The attention at this time was turned into the left knee, where a left knee arthroscopy was performed.  The left knee arthroscopy showed some minimal grade 2  change in the patellofemoral artery, no pits or full-thickness areas of injury.  Attention was turned to the medial compartment, where there was exposed bone on the medial femoral condyle as well as the medial tibial plateau, and the medial meniscus was noted to have a significant midsubstance tear.  At this point attention was turned to the anterior cruciate, which was normal.  Attention was turned to the lateral side, which was also normal. Following this, attention was turned, the knee was copiously irrigated and suctioned dry, and the arthroscopic camera was removed.  At this point the drapes were removed from the patient.  The patient then had tourniquets placed bilaterally and the right and left extremities were then prepped and draped in the usual sterile fashion.  Following this, the right knee was addressed first and following Esmarch exsanguination of the extremity, a blood pressure tourniquet was inflated to 350 mmHg.  Following this, attention was turned to a medial parapatellar incision, where a medial parapatellar arthrotomy was then performed and the proximal tibia was exposed.  The joint was exposed. Attention was turned toward the tibial joint line, where a previously outlined bone resection was undertaken 4 mm below the medial area.  Once this was undertaken, the attention was turned toward the resection guide, where it was placed perpendicular to the long axis of the tibia with a posterior slope appropriate to the, perpendicular to the tibia.  The posterior slope was then established and the proximal tibial cut was made.  A reciprocating saw was used to cut a portion into the notch.  Once this was done, the piece was removed in total, and a size 7 spacer block was put in place.  It went easily into the flexion gap with the appropriate amount of tension.  Following this, the leg was brought into extension, where there was noted to be slight gapping in extension but  certainly not excessive.  At this point, a 2 mm _____ was put in place and the distal femoral cut was made.  Following the distal  femoral cut, the alignment was assessed using the midportion of the tibial component in both the midportion and flexion and extension.  A size 2 femoral component was then undertaken, and this size 2 did, in fact, fit in the appropriate alignment.  The size 2 jig was then put in place, was gouged out at the top.  The cutting block was then put in place, and the anterior-posterior chamfers were cut as well as the posterior cut, final cut. At this point, the size 2 block was put in place and a trial reduction was undertaken, and the excellent reduction was achieved.  The patient was brought to near-neutral alignment and certainly was not brought into any valgus, probably was left in a tiny bit of varus, which was the preoperative plan.  At this point all trials were removed.  The final components were opened and actually put in place and then removed and dried.  The cement was mixed, and the components were then cemented into place.  Final range of motion was achieved.  No evidence of impaction or anything catching at that point, and once this was achieved, the patient then had the knee copiously irrigated and suctioned dry once the cement was dried.  A medium Hemovac drain was put in place, and the medial parapatellar arthrotomy was closed with a 1 Vicryl running interrupted suture.  The deep subcu was closed with 0 and 2-0 Vicryl, and the skin was closed with a 3-0 Mersilene suture and Steri-Strips were applied.  The medium Hemovac drain was then put in place and was hooked up. At this point the tourniquet was let down.  There was 1 hour 46 minutes of tourniquet time on the right side.  At this time attention was turned to the left side, where the Esmarch exsanguination was undertaken.  A blood pressure tourniquet was inflated to 350 mmHg.  A similar procedure  was performed on the left side with the ultimate result being a size 2 femoral component, a size 3 tibial component similar to the opposite side.  Excellent range of motion was achieved and no tendency toward catching or locking.  A medium Hemovac drain was placed there as well, and the wound was closed in a similar fashion.  A sterile compressive dressing was applied.  The patient was placed into a knee immobilizer bilaterally.  The tourniquet time on the left side was 1 hour 46 minutes also.  The estimated blood loss for the procedure total was about 100 cc, and this was from the right Hemovac drain.  Following this, the patient was taken to the recovery room after an epidural catheter was placed. She was noted to be in satisfactory condition. Dictated by:   Harvie Junior, M.D. Attending Physician:  Milly Jakob DD:  07/05/01 TD:  07/06/01 Job: [id number]

## 2011-03-24 NOTE — Op Note (Signed)
[place]  Patient:    LAKINDRA, WIBLE Visit Number: [id number] MRN: [id number]          Service Type: MED Location: [id number] Attending Physician:  Milly Jakob Dictated by:   Harvie Junior, M.D. Proc. Date: 07/05/01 Admit Date:  07/05/2001 Discharge Date: 07/08/2001                             Operative Report  PREOPERATIVE DIAGNOSIS:  Bilateral degenerative joint disease with medial compartment disease specifically, both knees.  POSTOPERATIVE DIAGNOSIS:  Bilateral degenerative joint disease with medial compartment disease only, both knees.  OPERATION PERFORMED: 1. Right knee arthroscopy with debridement of medial compartment. 2. Left knee arthroscopy with debridement of medial compartment. 3. Right knee unicompartmental replacement with Depuy preservation system,    size 2 femur, size 3 cemented tibia. 4. Left knee unicompartmental knee replacement, size 2 femoral component,    size 3 tibial component cemented.  SURGEON:  Harvie Junior, M.D.  ASSISTANT: 1. Lubertha Basque Jerl Santos, M.D. 2. Currie Paris. Thedore Mins.  ANESTHESIA:  INDICATIONS FOR PROCEDURE:  The patient is a 77 year old female with a long history of having several years of medial compartment disease severe.  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 We talked about knee replacement and she felt that the kind of tennis she plays would be more amenable to some less invasive procedure.  Discussion of unicompartmental knee replacement was undertaken.  She ultimately did well for another year and a half or so and continued playing but ultimately had to give up tennis because of the severe pain she was having in her knees.  X-rays continued to show only unicompartmental disease with bone-on-bone in the medial compartment.  Lateral x-ray, lateral stress x-ray and sunrise x-ray showed no abnormalities in the patellofemoral joint.  Given her  advanced age, it was felt appropriate to do an arthroscopic procedure prior to doing any kind of replacement to make sure that we knew exactly what we were dealing with.  To that extent, the patient was prepared for simultaneous bilateral knee replacement versus unicompartmental knee replacement if we felt that she was an adequate candidate for this and she was brought to the operating room for this procedure.  DESCRIPTION OF PROCEDURE:  The patient was brought to the operating room and after adequate anesthesia was obtained with a general anesthetic, the patient was placed supine on the operating table.  Both legs were then prepped and draped in the usual sterile fashion at this point.  Following this, a right knee arthroscopy was performed.  Knee arthroscopy revealed that there was no significant abnormality in the patellofemoral joint.  There was no significant abnormality in the lateral joint and the anterior cruciate was within normal limits.  Attention was then turned to the medial compartment where there was noted to be severe degenerative changes with medial meniscus tearing. Debridement of the medial compartment was undertaken at this time.  Once the medial compartment was cleaned up significantly, attention was then turned towards removal of the instruments from the medial compartment.  At this time attention was turned to the left knee where arthroscopic examination was performed.  The arthroscopic examination revealed that there were no significant abnormalities in the patellofemoral joint.  The lateral side was likewise within normal limits.  Attention was turned to the anterior cruciate ligament which was normal  and then to the medial compartment.  Again, there was noted to be significant degenerative changes in the medial compartment including a large meniscal tear.  This was debrided back to a stable rim and once debridement of the medial compartment was undertaken the  scope was removed.  At this point the drapes were taken down and the decision was made to do a unicompartmental knee replacement bilaterally.  At this point the foot rests were put in place.  Tourniquets were placed on bilateral lower extremities and following placement of all of the appropriate positioning devices, the legs were then prepped and draped again in the usual sterile fashion.  Following this, the right knee was addressed first, the tourniquet was inflated to 350 mmHg after Esmarch exsanguination of the extremity. Attention was then turned to the medial parapatellar incision where a median parapatellar arthrotomy was then performed and the proximal tibia exposed. The joint was exposed and attention was turned towards the tibial joint line where a previously outlined bone resection was undertaken 4 mm below the medial area.  Once this was undertaken, attention was turned returned toward the resection guide which was placed perpendicular to the long axis of the tibia, with the posterior slope appropriate to the perpendicular to the tibia. The posterior slope was then established and the proximal tibial cut was made. A reciprocating saw was used to cut a portion into the notch.  Once this was done, the piece was removed in total.  A size 7 mm spacer block was put in place with a size 3 tibia.  This went in easily.  The leg went into flexion in the appropriate amount of tension.  Following this, the leg was brought into extension where there was noted to be slight gapping but certainly not excessive at this point.  A 2 mm jig was put in place and the distal femoral cut was made.  Following the distal femoral cut, the alignment was assessed using the midportion of the tibial component in both midportion with flexion and extension and a size 2 femoral component was then undertaken.  This size 2 did, in fact, fit in the appropriate alignment.  The size 2 jig was then put in place and  was gouged around at the top.  The cutting block was put in place and the anterior posterior chamfers were cut as well.  The posterior final cut  was made at this point.  The size two block was put in place and the trial reduction was undertaken.  Excellent reduction was achieved.  The patient was brought to near neutral alignment and certainly was not brought into any valgus per our preoperative planning.  At this point the trials were removed. The final components were opened and put in place and put through a range of motion.  They were then removed and dried.  The cement was mixed.  Prior to placement of the tibial component, a keel had to be made for the tibial component.  Once the final range of motion was achieved, it was excellent. The components were placed and cemented into place and held in extension with compression against the components until the cement was completely dry.  A medium Hemovac drain was put in place.  The medial parapatellar arthrotomy was closed with #1 Vicryl interrupted suture.  The subcutaneous layer was closed with 0 and 2-0 Vicryl and the skin with a 3-0 Mersilene pull-out suture.  A medium Hemovac drain was then hooked up and the tourniquet  was let down. There was about one hour and 46 minutes of tourniquet time on the right side. At this time attention was turned to the left side where Esmarch exsanguination was undertaken.  The blood pressure tourniquet was inflated to 350 mmHg.  A similar procedure was performed on the left side with the ultimate result being a size 2 femoral component and a size 3 tibial component similar to the opposite side.  Excellent range of motion was achieved and no tendency towards catching or locking. A medium Hemovac drain was placed on that side and the wound was closed in a similar fashion.  A sterile compressive dressing was applied and the patient was taken into a knee immobilizer bilaterally.  The tourniquet time on the  left side was an hour and 46 minutes also.  Estimated blood loss for this procedure was 100 cc on the right Hemovac during the left case.  Following this, the patient was taken to the recovery room after an epidural catheter was placed.  She was noted to be in satisfactory condition. Dictated by:   Harvie Junior, M.D. Attending Physician:  Milly Jakob DD:  07/18/01 TD:  07/18/01 Job: [id number]

## 2011-03-28 ENCOUNTER — Encounter: Payer: Self-pay | Admitting: Internal Medicine

## 2011-03-29 ENCOUNTER — Other Ambulatory Visit: Payer: Self-pay | Admitting: *Deleted

## 2011-03-29 MED ORDER — LORAZEPAM 0.5 MG PO TABS: ORAL_TABLET | ORAL | Status: DC

## 2011-05-12 ENCOUNTER — Other Ambulatory Visit: Payer: Self-pay | Admitting: Obstetrics & Gynecology

## 2011-05-12 ENCOUNTER — Other Ambulatory Visit: Payer: Self-pay | Admitting: Internal Medicine

## 2011-05-12 DIAGNOSIS — M899 Disorder of bone, unspecified: Secondary | ICD-10-CM

## 2011-05-12 DIAGNOSIS — M949 Disorder of cartilage, unspecified: Secondary | ICD-10-CM

## 2011-05-12 MED ORDER — LORAZEPAM 0.5 MG PO TABS: ORAL_TABLET | ORAL | Status: DC

## 2011-05-12 NOTE — Telephone Encounter (Signed)
Side Note: Patient Due for CPX-30 min appointment

## 2011-06-20 ENCOUNTER — Ambulatory Visit
Admission: RE | Admit: 2011-06-20 | Discharge: 2011-06-20 | Disposition: A | Discharge status: Home / Self Care | Payer: PRIVATE HEALTH INSURANCE | Source: Ambulatory Visit | Attending: Obstetrics & Gynecology | Admitting: Obstetrics & Gynecology

## 2011-06-20 DIAGNOSIS — M949 Disorder of cartilage, unspecified: Secondary | ICD-10-CM

## 2011-06-20 DIAGNOSIS — M899 Disorder of bone, unspecified: Secondary | ICD-10-CM

## 2011-06-28 ENCOUNTER — Encounter: Payer: Self-pay | Admitting: Internal Medicine

## 2011-06-28 ENCOUNTER — Ambulatory Visit (INDEPENDENT_AMBULATORY_CARE_PROVIDER_SITE_OTHER): Payer: Medicare Other | Admitting: Internal Medicine

## 2011-06-28 VITALS — BP 118/68 | HR 78 | Temp 97.5°F | Ht 61.25 in | Wt 106.6 lb

## 2011-06-28 DIAGNOSIS — R030 Elevated blood-pressure reading, without diagnosis of hypertension: Secondary | ICD-10-CM

## 2011-06-28 DIAGNOSIS — Z0000 Encounter for general adult medical examination without abnormal findings: Secondary | ICD-10-CM

## 2011-06-28 DIAGNOSIS — E785 Hyperlipidemia, unspecified: Secondary | ICD-10-CM

## 2011-06-28 DIAGNOSIS — M899 Disorder of bone, unspecified: Secondary | ICD-10-CM

## 2011-06-28 DIAGNOSIS — M949 Disorder of cartilage, unspecified: Secondary | ICD-10-CM

## 2011-06-28 LAB — CBC WITH DIFFERENTIAL/PLATELET
Basophils Absolute: 0 10*3/uL (ref 0.0–0.1)
Basophils Relative: 0.5 % (ref 0.0–3.0)
Eosinophils Absolute: 0 10*3/uL (ref 0.0–0.7)
Eosinophils Relative: 0.6 % (ref 0.0–5.0)
HCT: 40.6 % (ref 36.0–46.0)
Hemoglobin: 13.4 g/dL (ref 12.0–15.0)
Lymphocytes Relative: 33.1 % (ref 12.0–46.0)
Lymphs Abs: 1.3 10*3/uL (ref 0.7–4.0)
MCHC: 33 g/dL (ref 30.0–36.0)
MCV: 89.7 fl (ref 78.0–100.0)
Monocytes Absolute: 0.4 10*3/uL (ref 0.1–1.0)
Monocytes Relative: 9 % (ref 3.0–12.0)
Neutro Abs: 2.2 10*3/uL (ref 1.4–7.7)
Neutrophils Relative %: 56.8 % (ref 43.0–77.0)
Platelets: 205 10*3/uL (ref 150.0–400.0)
RBC: 4.53 Mil/uL (ref 3.87–5.11)
RDW: 14.3 % (ref 11.5–14.6)
WBC: 4 10*3/uL — ABNORMAL LOW (ref 4.5–10.5)

## 2011-06-28 LAB — LIPID PANEL
Cholesterol: 218 mg/dL — ABNORMAL HIGH (ref 0–200)
HDL: 93.5 mg/dL (ref >39.00)
Total CHOL/HDL Ratio: 2
Triglycerides: 48 mg/dL (ref 0.0–149.0)
VLDL: 9.6 mg/dL (ref 0.0–40.0)

## 2011-06-28 LAB — BASIC METABOLIC PANEL
BUN: 20 mg/dL (ref 6–23)
CO2: 25 mEq/L (ref 19–32)
Calcium: 9.6 mg/dL (ref 8.4–10.5)
Chloride: 107 mEq/L (ref 96–112)
Creatinine, Ser: 0.6 mg/dL (ref 0.4–1.2)
GFR: 95.15 mL/min (ref >60.00)
Glucose, Bld: 81 mg/dL (ref 70–99)
Potassium: 3.9 mEq/L (ref 3.5–5.1)
Sodium: 142 mEq/L (ref 135–145)

## 2011-06-28 LAB — LDL CHOLESTEROL, DIRECT: Direct LDL: 113.4 mg/dL

## 2011-06-28 LAB — HEPATIC FUNCTION PANEL
ALT: 22 U/L (ref 0–35)
AST: 28 U/L (ref 0–37)
Albumin: 4.2 g/dL (ref 3.5–5.2)
Alkaline Phosphatase: 65 U/L (ref 39–117)
Bilirubin, Direct: 0.1 mg/dL (ref 0.0–0.3)
Total Bilirubin: 0.6 mg/dL (ref 0.3–1.2)
Total Protein: 7.1 g/dL (ref 6.0–8.3)

## 2011-06-28 LAB — TSH: TSH: 2.52 u[IU]/mL (ref 0.35–5.50)

## 2011-06-28 NOTE — Patient Instructions (Signed)
Recommended lifestyle interventions for Osteoporosis include calcium 600 mg twice a day  & vitamin D3 supplementation to keep vit D  level @ least 40-60. The usual vitamin D3 dose is 1000 IU daily; but individual dose is determined by annual vitamin D level monitor. Also weight bearing exercise such as  walking 30-45 minutes 3-4  X per week is recommended As per the Standard of Care , screening Colonoscopy recommended @ 50 & every 5-10 years thereafter . More frequent monitor would be dictated by family history or findings @ Colonoscopy

## 2011-06-28 NOTE — Progress Notes (Signed)
Subjective:    Patient ID: Danielle Mcgee, female    DOB: 11-May-1930, 77 y.o.   MRN: [id number]  HPI Medicare Wellness Visit:  The following psychosocial & medical history were reviewed as required by Medicare.   Social history: caffeine: none , alcohol:  socially ,  tobacco use : never  & exercise : walking 4X/week 30-60 min.   Home & personal  safety / fall risk: no issues, activities of daily living: no limitations , seatbelt use : yes , and smoke alarm employment : yes .  Power of Attorney/Living Will status : in place  Vision ( as recorded per Nurse) & Hearing  evaluation :  Wall chart read @ 6 ft; whisper heard @ 6 ft. Orientation :oriented X 3 , memory & recall :good, spelling  testing: good,and mood & affect : normal . Depression / anxiety: denied Travel history : [place] 2012 , immunization status :Shingles needed , transfusion history:  no, and preventive health surveillance ( colonoscopies, BMD , etc as per protocol/ [facility]): colonoscopy due 2012, Dental care:  Seen every 6 mos . Chart reviewed &  Updated. Active issues reviewed & addressed.       Review of Systems Patient reports no significant  vision/ hearing  changes, adenopathy,fever, weight change,  persistant / recurrent hoarseness , swallowing issues, chest pain,palpitations,edema,persistant /recurrent cough, hemoptysis, dyspnea( rest/ exertional/paroxysmal nocturnal), gastrointestinal bleeding(melena, rectal bleeding), abdominal pain, significant heartburn,  bowel changes,GU symptoms(dysuria, hematuria,pyuria, incontinence), Gyn symptoms(abnormal  bleeding , pain),  syncope, focal weakness, memory loss, skin/hair /nail changes, or abnormal bruising or bleeding.  She describes a positional phenomenon of tingling of the right face and right upper extremity with left lateral neck rotation. This can also be associated with a slight tremor in the right upper extremity. The symptoms resolved when the position is reversed.       Objective:   Physical Exam Gen.: Thin healthy and well-nourished in appearance. Alert, appropriate and cooperative throughout exam. Head: Normocephalic without obvious abnormalities  Eyes: No corneal or conjunctival inflammation noted. Pupils equal round reactive to light and accommodation. Fundal exam is benign without hemorrhages, exudate, papilledema. Extraocular motion intact. Vision grossly normal. Ears: External  ear exam reveals no significant lesions or deformities. Canals clear .TMs normal. Hearing is grossly normal bilaterally. Nose: External nasal exam reveals no deformity or inflammation. Nasal mucosa are pink and moist. No lesions or exudates noted.  Mouth: Oral mucosa and oropharynx reveal no lesions or exudates. Teeth in good repair. Neck: No deformities, masses, or tenderness noted. Range of motion &. Thyroid normal. Lungs: Normal respiratory effort; chest expands symmetrically. Lungs are clear to auscultation without rales, wheezes, or increased work of breathing. Heart: Normal rate and rhythm. Normal S1 and S2. No gallop,  or rub. No  Murmur but apical click present. Abdomen: Bowel sounds normal; abdomen soft and nontender. No masses, organomegaly or hernias noted. Genitalia: Dr Seymour Bars   .                                                                                   Musculoskeletal/extremities: No deformity or scoliosis noted of  the thoracic or lumbar spine. No clubbing, cyanosis,  edema  noted. Range of motion  normal .Tone & strength  Normal.Joints:mixed hand arthritic changes. Nail health  good. Vascular: Carotid, radial artery, dorsalis pedis and  posterior tibial pulses are full and equal. No bruits present. Neurologic: Alert and oriented x3. Deep tendon reflexes symmetrical and normal.         Skin: Intact without suspicious lesions or rashes. Lymph: No cervical, axillary  lymphadenopathy present. Psych: Mood and affect are normal. Normally interactive                                                                                          Assessment & Plan:  #1 Medicare Wellness Exam; criteria met ; data entered #2 Problem List reviewed ; Assessment/ Recommendations made   #3 R sided paresthesias related to rotation of the cervical spine Plan: see Orders

## 2011-06-29 LAB — VITAMIN D 25 HYDROXY (VIT D DEFICIENCY, FRACTURES): Vit D, 25-Hydroxy: 55 ng/mL (ref 30–89)

## 2011-06-30 ENCOUNTER — Other Ambulatory Visit: Payer: Self-pay | Admitting: Internal Medicine

## 2011-06-30 MED ORDER — LORAZEPAM 0.5 MG PO TABS: ORAL_TABLET | ORAL | Status: DC

## 2011-06-30 NOTE — Telephone Encounter (Signed)
Done

## 2011-07-07 ENCOUNTER — Encounter: Payer: Self-pay | Admitting: Internal Medicine

## 2011-08-02 ENCOUNTER — Ambulatory Visit (AMBULATORY_SURGERY_CENTER): Payer: Medicare Other | Admitting: *Deleted

## 2011-08-02 VITALS — Ht 61.0 in | Wt 109.0 lb

## 2011-08-02 DIAGNOSIS — Z1211 Encounter for screening for malignant neoplasm of colon: Secondary | ICD-10-CM

## 2011-08-02 MED ORDER — PEG-KCL-NACL-NASULF-NA ASC-C 100 G PO SOLR: ORAL | Status: DC

## 2011-08-07 LAB — CBC
HCT: 36.9
Hemoglobin: 12.1
MCHC: 32.8
MCV: 91.3
Platelets: 195
RBC: 4.05
RDW: 13.6
WBC: 4.9

## 2011-08-07 LAB — DIFFERENTIAL
Basophils Absolute: 0
Basophils Relative: 0
Eosinophils Absolute: 0
Eosinophils Relative: 1
Lymphocytes Relative: 33
Lymphs Abs: 1.6
Monocytes Absolute: 0.6
Monocytes Relative: 12
Neutro Abs: 2.7
Neutrophils Relative %: 54

## 2011-08-09 ENCOUNTER — Ambulatory Visit (INDEPENDENT_AMBULATORY_CARE_PROVIDER_SITE_OTHER): Payer: Medicare Other | Admitting: Internal Medicine

## 2011-08-09 VITALS — BP 130/78 | HR 95 | Temp 98.2°F | Wt 109.8 lb

## 2011-08-09 DIAGNOSIS — J019 Acute sinusitis, unspecified: Secondary | ICD-10-CM

## 2011-08-09 MED ORDER — AMOXICILLIN 500 MG PO CAPS: 500.0000 mg | ORAL_CAPSULE | Freq: Three times a day (TID) | ORAL | Status: AC

## 2011-08-09 MED ORDER — FLUTICASONE PROPIONATE 50 MCG/ACT NA SUSP: 1.0000 | NASAL | Status: DC

## 2011-08-09 NOTE — Patient Instructions (Signed)
Plain Mucinex for thick secretions ;force NON dairy fluids for next 48 hrs. Use a Neti pot daily as needed for sinus congestion 

## 2011-08-09 NOTE — Progress Notes (Signed)
  Subjective:    Patient ID: Danielle Mcgee, female    DOB: 1930-05-06, 77 y.o.   MRN: [id number]  HPI Respiratory tract infection Onset/symptoms:> 1 week  ago as headaches Exposures (illness/environmental/extrinsic):no Progression of symptoms:increasaed headaches Treatments/response:Neti Rinse with improvement Present symptoms: Fever/chills/sweats:no Frontal headache:yes Facial pain:yes Nasal purulence:scant yellow Sore throat:no Dental pain:no Lymphadenopathy:no Wheezing/shortness of breath:no Cough/sputum/hemoptysis:no Associated extrinsic/allergic symptoms:itchy eyes/ sneezing:no Smoking history:never           Review of Systems     Objective:   Physical Exam General appearancethin; no acute distress or increased work of breathing is present.  No  lymphadenopathy about the head, neck, or axilla noted.   Eyes: No conjunctival inflammation or lid edema is present. Full EOM  Ears:  External ear exam shows no significant lesions or deformities.  Otoscopic examination reveals clear canals, tympanic membranes are intact bilaterally without bulging, retraction, inflammation or discharge.  Nose:  External nasal examination shows no deformity or inflammation. Nasal mucosa are pink and moist without lesions or exudates. No septal dislocation .No obstruction to airflow.   Oral exam: Dental hygiene is good; lips and gums are healthy appearing.There is no oropharyngeal erythema or exudate noted.     Heart:  Normal rate and regular rhythm. S1 and S2 normal without gallop, murmur, click, rub . S 4 Lungs:Chest clear to auscultation; no wheezes, rhonchi,rales ,or rubs present.No increased work of breathing.    Extremities:  No cyanosis, edema, or clubbing  noted    Skin: Warm & dry           Assessment & Plan:  #1 acute rhinosinusitis  Plan: See orders and recommendations

## 2011-08-18 ENCOUNTER — Other Ambulatory Visit: Payer: Self-pay | Admitting: Internal Medicine

## 2011-08-18 MED ORDER — LORAZEPAM 0.5 MG PO TABS: ORAL_TABLET | ORAL | Status: DC

## 2011-08-18 NOTE — Telephone Encounter (Signed)
RX sent

## 2011-08-21 ENCOUNTER — Telehealth: Payer: Self-pay | Admitting: Internal Medicine

## 2011-08-21 DIAGNOSIS — J019 Acute sinusitis, unspecified: Secondary | ICD-10-CM

## 2011-08-21 MED ORDER — FLUTICASONE PROPIONATE 50 MCG/ACT NA SUSP: 1.0000 | NASAL | Status: DC

## 2011-08-21 NOTE — Telephone Encounter (Signed)
RX sent

## 2011-08-24 ENCOUNTER — Ambulatory Visit (AMBULATORY_SURGERY_CENTER): Payer: Medicare Other | Admitting: Internal Medicine

## 2011-08-24 ENCOUNTER — Encounter: Payer: Self-pay | Admitting: Internal Medicine

## 2011-08-24 VITALS — BP 138/58 | HR 75 | Temp 97.4°F | Resp 20 | Ht 61.0 in | Wt 109.0 lb

## 2011-08-24 DIAGNOSIS — M949 Disorder of cartilage, unspecified: Secondary | ICD-10-CM

## 2011-08-24 DIAGNOSIS — M899 Disorder of bone, unspecified: Secondary | ICD-10-CM

## 2011-08-24 DIAGNOSIS — E785 Hyperlipidemia, unspecified: Secondary | ICD-10-CM

## 2011-08-24 DIAGNOSIS — Z1211 Encounter for screening for malignant neoplasm of colon: Secondary | ICD-10-CM

## 2011-08-24 MED ORDER — SODIUM CHLORIDE 0.9 % IV SOLN: 500.0000 mL | INTRAVENOUS | Status: DC

## 2011-08-25 ENCOUNTER — Telehealth: Payer: Self-pay | Admitting: *Deleted

## 2011-08-25 NOTE — Telephone Encounter (Signed)

## 2011-09-01 ENCOUNTER — Encounter: Payer: Self-pay | Admitting: Internal Medicine

## 2011-09-01 ENCOUNTER — Ambulatory Visit (INDEPENDENT_AMBULATORY_CARE_PROVIDER_SITE_OTHER): Payer: Medicare Other | Admitting: Internal Medicine

## 2011-09-01 DIAGNOSIS — M26629 Arthralgia of temporomandibular joint, unspecified side: Secondary | ICD-10-CM

## 2011-09-01 DIAGNOSIS — R519 Headache, unspecified: Secondary | ICD-10-CM

## 2011-09-01 DIAGNOSIS — M2669 Other specified disorders of temporomandibular joint: Secondary | ICD-10-CM

## 2011-09-01 DIAGNOSIS — R51 Headache: Secondary | ICD-10-CM

## 2011-09-01 LAB — CBC WITH DIFFERENTIAL/PLATELET
Basophils Absolute: 0 10*3/uL (ref 0.0–0.1)
Basophils Relative: 0 % (ref 0–1)
Eosinophils Absolute: 0.1 10*3/uL (ref 0.0–0.7)
Eosinophils Relative: 2 % (ref 0–5)
HCT: 38.3 % (ref 36.0–46.0)
Hemoglobin: 12.2 g/dL (ref 12.0–15.0)
Lymphocytes Relative: 27 % (ref 12–46)
Lymphs Abs: 1.5 10*3/uL (ref 0.7–4.0)
MCH: 28.4 pg (ref 26.0–34.0)
MCHC: 31.9 g/dL (ref 30.0–36.0)
MCV: 89.3 fL (ref 78.0–100.0)
Monocytes Absolute: 1 10*3/uL (ref 0.1–1.0)
Monocytes Relative: 18 % — ABNORMAL HIGH (ref 3–12)
Neutro Abs: 2.8 10*3/uL (ref 1.7–7.7)
Neutrophils Relative %: 53 % (ref 43–77)
Platelets: 350 10*3/uL (ref 150–400)
RBC: 4.29 MIL/uL (ref 3.87–5.11)
RDW: 12.5 % (ref 11.5–15.5)
WBC: 5.4 10*3/uL (ref 4.0–10.5)

## 2011-09-01 MED ORDER — GABAPENTIN 100 MG PO CAPS: 100.0000 mg | ORAL_CAPSULE | ORAL | Status: DC

## 2011-09-01 NOTE — Patient Instructions (Addendum)
Soft or liquid diet if having pain at the temporomandibular joints. Use warm moist compresses to 3 times a day over the painful area. Please keep a diary of your headaches . Document  each occurrence on the calendar with notation of : #1 any prodrome ( any non headache symptom such as marked fatigue,visual changes, ,etc ) which precedes actual headache ; #2) severity on 1-10 scale; #3) any triggers ( food/ drink,enviromenntal or weather changes ,physical or emotional stress) in 8-12 hour period prior to the headache; & #4) response to any medications or other intervention. Please review "Headache" @ [facility] for additional information.

## 2011-09-01 NOTE — Progress Notes (Signed)
Subjective:    Patient ID: Danielle Mcgee, female    DOB: 10-27-1930, 77 y.o.   MRN: [id number]  HPI HEADACHE : Onset: approx 10/10   Location: periorbitally OS> OD, occipitally, & over crown  Quality: dull - sharp; the sharp pain "comes out top of head" Frequency:facial pain  constant ; other pain is intermittent  Precipitating factors: no  Prior treatment: Aleve 1 twice a day  with benefit   Associated Symptoms Nausea/vomiting: no  Photophobia/phonophobia: no  Tearing of eyes: no, but white film after bending over  Sinus pain/pressure: yes, w/o purulence  Family or PMH migraine: no  Red Flags Fever: no  Neck pain/stiffness: no  Vision/speech/swallow/hearing difficulty: yes, ?altered vision OS watching tv  Focal weakness/numbness: no  Altered mental status: no   New type of headache: yes, except facial pain  Anticoagulant use: no   Review of Systems  She has had pain in the temporomandibular joints for approximately one week. She states it hurts even to chew cookies     Objective:   Physical Exam  Gen.: Thin but healthy and well-nourished in appearance. Alert, appropriate and cooperative throughout exam. Head: Normocephalic without obvious abnormalities  Eyes: No corneal or conjunctival inflammation noted. Pupils equal round reactive to light and accommodation. FOV normal. Extraocular motion intact. Vision grossly normal to wall chart @ 6 ft. Ears: External  ear exam reveals no significant lesions or deformities. Canals clear .TMs normal. Hearing is grossly normal bilaterally. Tuning fork exam is normal Nose: External nasal exam reveals no deformity or inflammation. Nasal mucosa are pink and moist. No lesions or exudates noted.  Mouth: Oral mucosa and oropharynx reveal no lesions or exudates. Teeth in good repair. No tongue deviation present.There is some crepitus of the temporomandibular joints. Neck: No deformities, masses, or tenderness noted. Range of motion  normal Lungs: Normal respiratory effort; chest expands symmetrically. Lungs are clear to auscultation without rales, wheezes, or increased work of breathing. Heart: Normal rate and rhythm. Normal S1 and S2. No gallop, click, or rub. S 4 w/o  murmur.                                                                                  Musculoskeletal/extremities:  No clubbing, cyanosis, edema noted. Range of motion  normal .Tone & strength  Normal.Joints:minor DIP OA changes. Nail health  good. Vascular: Carotid artery pulses are full and equal. No bruits present. Neurologic: Alert and oriented x3. Deep tendon reflexes symmetrical and normal. Gait is normal as  is  Romberg testing and finger to nose.         Skin: Intact without suspicious lesions or rashes. Lymph: No cervical, axillary lymphadenopathy present. Psych: Mood and affect are normal. Normally interactive  Assessment & Plan:  #1 headache, atypical. This involves the occipital area as well as the crown. She also has peri-orbital discomfort greater on the left than the right. This is in the context of a recent upper respiratory infection. Neurologic exam was unremarkable as is ENT exam. She has been taking Aleve daily; such an agent has been noted to cause chronic daily headaches.  #2 symptoms of temporomandibular joint dysfunction with crepitus on exam.  Plan: See orders and recommendations.

## 2011-09-01 NOTE — Progress Notes (Signed)
Addended by: Legrand Como on: 09/01/2011 04:25 PM   Modules accepted: Orders

## 2011-09-02 LAB — SEDIMENTATION RATE: Sed Rate: 60 mm/hr — ABNORMAL HIGH (ref 0–22)

## 2011-09-03 ENCOUNTER — Other Ambulatory Visit: Payer: Self-pay | Admitting: Internal Medicine

## 2011-09-03 DIAGNOSIS — R519 Headache, unspecified: Secondary | ICD-10-CM

## 2011-09-03 DIAGNOSIS — R700 Elevated erythrocyte sedimentation rate: Secondary | ICD-10-CM

## 2011-09-04 ENCOUNTER — Ambulatory Visit (INDEPENDENT_AMBULATORY_CARE_PROVIDER_SITE_OTHER)
Admission: RE | Admit: 2011-09-04 | Discharge: 2011-09-04 | Disposition: A | Discharge status: Home / Self Care | Payer: Medicare Other | Source: Ambulatory Visit | Attending: Internal Medicine | Admitting: Internal Medicine

## 2011-09-04 ENCOUNTER — Telehealth: Payer: Self-pay | Admitting: Internal Medicine

## 2011-09-04 DIAGNOSIS — R519 Headache, unspecified: Secondary | ICD-10-CM

## 2011-09-04 DIAGNOSIS — R51 Headache: Secondary | ICD-10-CM

## 2011-09-04 NOTE — Telephone Encounter (Signed)
Change to full head CT scan; diagnosis : atypical ,diffuse headaches. Working diagnosis is atypical headache, rule out temporal arteritis

## 2011-09-04 NOTE — Telephone Encounter (Signed)
In reference to CT Maxillofacial, patient is scheduled Today, 09-04-11 at 1:30pm, [place] CT.  Patient called CT department to ask what all is covered with this type of CT, and was told Sinus.  Patient just called me back, and states she feels that more should be looked at than just her sinuses, due to her multiple symptoms.  Patient asking if Dr. Alwyn Ren will order full Head CT.  Please advise.

## 2011-09-04 NOTE — Telephone Encounter (Signed)
I have spoken with [facility] [facility].  Scan has been changed to CT Head w/o.  I will inform patient.

## 2011-09-05 ENCOUNTER — Encounter: Payer: Self-pay | Admitting: Internal Medicine

## 2011-09-05 ENCOUNTER — Ambulatory Visit (INDEPENDENT_AMBULATORY_CARE_PROVIDER_SITE_OTHER): Payer: Medicare Other | Admitting: Internal Medicine

## 2011-09-05 DIAGNOSIS — R202 Paresthesia of skin: Secondary | ICD-10-CM

## 2011-09-05 DIAGNOSIS — R519 Headache, unspecified: Secondary | ICD-10-CM

## 2011-09-05 DIAGNOSIS — H579 Unspecified disorder of eye and adnexa: Secondary | ICD-10-CM

## 2011-09-05 DIAGNOSIS — I776 Arteritis, unspecified: Secondary | ICD-10-CM

## 2011-09-05 DIAGNOSIS — R209 Unspecified disturbances of skin sensation: Secondary | ICD-10-CM

## 2011-09-05 DIAGNOSIS — R51 Headache: Secondary | ICD-10-CM

## 2011-09-05 MED ORDER — PREDNISONE 20 MG PO TABS: 20.0000 mg | ORAL_TABLET | ORAL | Status: DC

## 2011-09-05 NOTE — Progress Notes (Signed)
  Subjective:    Patient ID: Danielle Mcgee, female    DOB: [birth year], 77 y.o.   MRN: [id number]  HPI The CAT scan revealed no significant processes. The changes present  are age-related &  expected.  Her ophthalmologist is also concerned about temporal arteritis. He instituted a high dose burst of steroids. We discussed her case by phone. A questionable retinal lesion was noted; her ophthalmologist is considering referral to a retinal specialist   Her headaches have improved with the first dose of steroids. Her TMJ symptoms have not changed. She continues to have variable visual changes in the left eye.   She has no other significant joint symptoms such as swelling, redness, or stiffness to suggest a systemic vasculitic process..    Review of Systems She notices with moving her head/ neck to the L, she will have numbness and tingling involving the entire right side of the body, from head to foot. This is also associated with some involuntary movement of the right hand.     Objective:   Physical Exam She is in no acute distress; she is thin but well-nourished. Pupils equally round ;conjunctiva without redness or discharge   There is some crepitus over the temporomandibular joints and slight dislocation with mastication.  There is slight tenderness at the inferior temples bilaterally.  She has no carotid bruits.  There is no lymphadenopathy about the neck or axilla.  There are minor degenerative joint changes present w/o effusion , color change or crepitus       Assessment & Plan:  #1 bitemporal pain with variable visual changes; rule out temporal arteritis. By history and exam there is no suggestion of a systemic vasculitis.  #2 paresthesias of the entire right side of the body historically related to rotation and flexion of the neck to the left.  Plan: #1 C-reactive protein  #2 Gen. surgery referral for temporal artery biopsy.  #3 cervical spine films with flexion and  extension to evaluate positional paresthesias. Neurology referral as I cannot explain this clinical pattern ; she expresses significant concern over these symptoms unrelated to probable  temporal arteritis.

## 2011-09-06 ENCOUNTER — Telehealth: Payer: Self-pay | Admitting: Internal Medicine

## 2011-09-06 ENCOUNTER — Other Ambulatory Visit: Payer: Self-pay | Admitting: Internal Medicine

## 2011-09-06 NOTE — Telephone Encounter (Signed)
Patient is calling, states she & Dr. Alwyn Ren discussed her having a temporal biopsy, and wants to know the status.  I do not see any biopsy orders.  Patient is very anxious.  Please advise.

## 2011-09-06 NOTE — Telephone Encounter (Signed)
The temporal artery biopsy is done by general surgeon; I have placed that  order.  Because of the numbness she experiences with change in position of her head; I will also order  x-rays of the cervical spine. This is not as important as pursuing the biopsies if possible.

## 2011-09-07 ENCOUNTER — Other Ambulatory Visit: Payer: Self-pay | Admitting: Internal Medicine

## 2011-09-07 ENCOUNTER — Encounter: Payer: Self-pay | Admitting: Neurology

## 2011-09-07 DIAGNOSIS — R202 Paresthesia of skin: Secondary | ICD-10-CM

## 2011-09-07 LAB — C-REACTIVE PROTEIN: CRP: 1.55 mg/dL — ABNORMAL HIGH (ref <0.60)

## 2011-09-07 NOTE — Patient Instructions (Signed)
Referral will be made to a Neurologist to evaluate the right-sided symptoms related to change in position of the neck.

## 2011-09-14 ENCOUNTER — Ambulatory Visit (INDEPENDENT_AMBULATORY_CARE_PROVIDER_SITE_OTHER): Payer: Medicare Other | Admitting: General Surgery

## 2011-09-14 ENCOUNTER — Other Ambulatory Visit (INDEPENDENT_AMBULATORY_CARE_PROVIDER_SITE_OTHER): Payer: Self-pay | Admitting: General Surgery

## 2011-09-14 ENCOUNTER — Other Ambulatory Visit: Payer: Self-pay | Admitting: Internal Medicine

## 2011-09-14 ENCOUNTER — Encounter (INDEPENDENT_AMBULATORY_CARE_PROVIDER_SITE_OTHER): Payer: Self-pay | Admitting: General Surgery

## 2011-09-14 VITALS — BP 138/80 | HR 76 | Temp 98.2°F | Resp 16 | Ht 61.0 in | Wt 110.6 lb

## 2011-09-14 DIAGNOSIS — M316 Other giant cell arteritis: Secondary | ICD-10-CM

## 2011-09-14 NOTE — Patient Instructions (Signed)
You will be scheduled for a left temporal artery biopsy under anesthesia sedation.

## 2011-09-14 NOTE — Telephone Encounter (Signed)
Dr.Hopper please advise 

## 2011-09-14 NOTE — Progress Notes (Signed)
Chief Complaint  Patient presents with  . Other    Eval temporal artery bx    HPI Danielle Mcgee is a 77 y.o. female.  She is referred to me Dr. Lona Mcgee  for consideration of temporal artery biopsy. Her ophthalmologist is Dr. Charlann Mcgee.  The patient is fairly healthy. She has developed problems with left orbital headaches and left orbital pain. Recent eye exam suggested a retinal lesion. She has noticed some slight visual change in her left eye. Her sed rate and C. reactive protein are elevated. She has been placed on steroids recently and her headaches are now goneHPI  Past Medical History  Diagnosis Date  . DJD (degenerative joint disease)   . Benign breast disease 2000  . Hyperlipidemia   . Mitral click-murmur syndrome     nomitral  reguritation  . Osteopenia   . Generalized headaches     Past Surgical History  Procedure Date  . Breast biopsy 2000    Atypical Lobular Hyperplasia  . Total knee arthroplasty     bilaterally  . Colonoscopy 2002    Dr Danielle Mcgee, due 2012  . Cataract extraction     bilaterally  . Appendectomy 1962  . Joint replacement 2001    both    Family History  Problem Relation Age of Onset  . Cancer Mother     BREAST  . Heart disease Mother     MI  . Osteoporosis Mother   . Coronary artery disease Father   . Heart disease Sister     CABG  . Cancer Sister     CERVICAL    Social History History  Substance Use Topics  . Smoking status: Never Smoker   . Smokeless tobacco: Never Used  . Alcohol Use: Yes      occasionally in social setting    Allergies  Allergen Reactions  . Codeine     REACTION: GI PROBLEMS    Current Outpatient Prescriptions  Medication Sig Dispense Refill  . aspirin 81 MG tablet Take 81 mg by mouth daily.        . Calcium Carbonate-Vitamin D (CALCIUM + D PO) Take 1,200 mg by mouth daily.        . Cholecalciferol (VITAMIN D) 2000 UNITS tablet Take 2,000 Units by mouth daily.        Marland Kitchen gabapentin (NEURONTIN) 100  MG capsule Take 1 capsule (100 mg total) by mouth as directed. One every 8 hours as needed for headache  30 capsule  2  . LORazepam (ATIVAN) 0.5 MG tablet Take 1 tablet at bedtime as needed  30 tablet  0  . Multiple Vitamins-Minerals (CENTRUM SILVER PO) Take 1 tablet by mouth daily.        . predniSONE (DELTASONE) 20 MG tablet Take 1 tablet (20 mg total) by mouth as directed. 1/2 tid with food  15 tablet  0  . raloxifene (EVISTA) 60 MG tablet Take 60 mg by mouth daily.          Review of Systems Review of Systems  Constitutional: Negative for fever, chills and unexpected weight change.  HENT: Negative for hearing loss, congestion, sore throat, trouble swallowing and voice change.   Eyes: Positive for pain and visual disturbance.  Respiratory: Negative for cough and wheezing.   Cardiovascular: Negative for chest pain, palpitations and leg swelling.  Gastrointestinal: Negative for nausea, vomiting, abdominal pain, diarrhea, constipation, blood in stool, abdominal distention and anal bleeding.  Genitourinary: Negative for hematuria, vaginal bleeding  and difficulty urinating.  Musculoskeletal: Negative for arthralgias.  Skin: Negative for rash and wound.  Neurological: Positive for headaches. Negative for seizures and syncope.  Hematological: Negative for adenopathy. Does not bruise/bleed easily.  Psychiatric/Behavioral: Negative for confusion.    Blood pressure 138/80, pulse 76, temperature 98.2 F (36.8 C), temperature source Temporal, resp. rate 16, height 5\' 1"  (1.549 m), weight 110 lb 9.6 oz (50.168 kg).  Physical Exam Physical Exam  Constitutional: She is oriented to person, place, and time. She appears well-developed and well-nourished. No distress.  HENT:  Head: Normocephalic and atraumatic.  Nose: Nose normal.  Mouth/Throat: No oropharyngeal exudate.       No tenderness or mass along temporal arteries. No adenopathy  Eyes: Conjunctivae and EOM are normal. Pupils are equal,  round, and reactive to light. Left eye exhibits no discharge. No scleral icterus.  Neck: Neck supple. No JVD present. No tracheal deviation present. No thyromegaly present.  Cardiovascular: Normal rate, regular rhythm, normal heart sounds and intact distal pulses.   No murmur heard. Pulmonary/Chest: Effort normal and breath sounds normal. No respiratory distress. She has no wheezes. She has no rales. She exhibits no tenderness.  Lymphadenopathy:    She has no cervical adenopathy.  Neurological: She is alert and oriented to person, place, and time.  Skin: Skin is warm. No rash noted. She is not diaphoretic. No erythema. No pallor.  Psychiatric: She has a normal mood and affect. Her behavior is normal. Judgment and thought content normal.    Data Reviewed I have reviewed Dr. Frederik Mcgee office notes and the lab work as well as a recent CT of the brain. Assessment    Headaches and visual disturbance. Suspect temporal arteritis.  Hyperlipidemia.    Plan    Schedule left temporal artery biopsy under monitored sedation. The patient prefers sedation.  I have discussed the indications and details of surgery with the patient and her husband. Risks and complications have been outlined, including but not limited to bleeding, infection, nerve damage with chronic pain numbness or facial asymmetry, cardiac pulmonary and thromboembolic problems. She seems to understand these issues well. At this time all of her questions were answered. She agrees with this plan.       Danielle Mcgee M 09/14/2011, 11:54 AM

## 2011-09-14 NOTE — Telephone Encounter (Signed)
OK X1 

## 2011-09-15 ENCOUNTER — Encounter (HOSPITAL_COMMUNITY): Payer: Self-pay | Admitting: Pharmacy Technician

## 2011-09-18 ENCOUNTER — Other Ambulatory Visit: Payer: Self-pay | Admitting: Internal Medicine

## 2011-09-19 ENCOUNTER — Encounter (HOSPITAL_COMMUNITY): Payer: Self-pay | Admitting: *Deleted

## 2011-09-19 ENCOUNTER — Other Ambulatory Visit (INDEPENDENT_AMBULATORY_CARE_PROVIDER_SITE_OTHER): Payer: Self-pay | Admitting: General Surgery

## 2011-09-19 MED ORDER — CEFAZOLIN SODIUM 1-5 GM-% IV SOLN
1.0000 g | INTRAVENOUS | Status: AC
  Administered 2011-09-20: 1 mg via INTRAVENOUS
  Filled 2011-09-19: qty 50

## 2011-09-19 MED ORDER — LORAZEPAM 0.5 MG PO TABS: ORAL_TABLET | ORAL | Status: DC

## 2011-09-19 NOTE — H&P (Signed)
Danielle Mcgee   09/14/2011 11:00 AM Office Visit  MRN: [id number]   Description: 77 year old female  Provider: [name], MD  Department: [facility]        Diagnoses     Temporal arteritis   - Primary    446.5      Reason for Visit     Other    Eval temporal artery bx        Vitals - Last Recorded       BP Pulse Temp(Src) Resp Ht Wt    138/80  76  98.2 F (36.8 C) (Temporal)  16  5\' 1"  (1.549 m)  110 lb 9.6 oz (50.168 kg)          BMI              20.90 kg/m2                 Progress Notes     [name], MD  09/14/2011 12:00 PM  SignedChief Complaint   Patient presents with   .  Other       Eval temporal artery bx      HPI Danielle Mcgee is a 77 y.o. female.  She is referred to me Dr. Lona Kettle  for consideration of temporal artery biopsy. Her ophthalmologist is Dr. Charlann Noss.   The patient is fairly healthy. She has developed problems with left orbital headaches and left orbital pain. Recent eye exam suggested a retinal lesion. She has noticed some slight visual change in her left eye. Her sed rate and C. reactive protein are elevated. She has been placed on steroids recently and her headaches are now goneHPI    Past Medical History   Diagnosis  Date   .  DJD (degenerative joint disease)     .  Benign breast disease  2000   .  Hyperlipidemia     .  Mitral click-murmur syndrome         nomitral  reguritation   .  Osteopenia     .  Generalized headaches         Past Surgical History   Procedure  Date   .  Breast biopsy  2000       Atypical Lobular Hyperplasia   .  Total knee arthroplasty         bilaterally   .  Colonoscopy  2002       Dr Juanda Chance, due 2012   .  Cataract extraction         bilaterally   .  Appendectomy  1962   .  Joint replacement  2001       both       Family History   Problem  Relation  Age of Onset   .  Cancer  Mother         BREAST   .  Heart disease  Mother         MI   .  Osteoporosis   Mother     .  Coronary artery disease  Father     .  Heart disease  Sister         CABG   .  Cancer  Sister         CERVICAL      Social History History   Substance Use Topics   .  Smoking status:  Never Smoker    .  Smokeless tobacco:  Never Used   .  Alcohol Use:  Yes          occasionally in social setting       Allergies   Allergen  Reactions   .  Codeine         REACTION: GI PROBLEMS       Current Outpatient Prescriptions   Medication  Sig  Dispense  Refill   .  aspirin 81 MG tablet  Take 81 mg by mouth daily.           .  Calcium Carbonate-Vitamin D (CALCIUM + D PO)  Take 1,200 mg by mouth daily.           .  Cholecalciferol (VITAMIN D) 2000 UNITS tablet  Take 2,000 Units by mouth daily.           Marland Kitchen  gabapentin (NEURONTIN) 100 MG capsule  Take 1 capsule (100 mg total) by mouth as directed. One every 8 hours as needed for headache   30 capsule   2   .  LORazepam (ATIVAN) 0.5 MG tablet  Take 1 tablet at bedtime as needed   30 tablet   0   .  Multiple Vitamins-Minerals (CENTRUM SILVER PO)  Take 1 tablet by mouth daily.           .  predniSONE (DELTASONE) 20 MG tablet  Take 1 tablet (20 mg total) by mouth as directed. 1/2 tid with food   15 tablet   0   .  raloxifene (EVISTA) 60 MG tablet  Take 60 mg by mouth daily.              Review of Systems Review of Systems  Constitutional: Negative for fever, chills and unexpected weight change.  HENT: Negative for hearing loss, congestion, sore throat, trouble swallowing and voice change.   Eyes: Positive for pain and visual disturbance.  Respiratory: Negative for cough and wheezing.   Cardiovascular: Negative for chest pain, palpitations and leg swelling.  Gastrointestinal: Negative for nausea, vomiting, abdominal pain, diarrhea, constipation, blood in stool, abdominal distention and anal bleeding.  Genitourinary: Negative for hematuria, vaginal bleeding and difficulty urinating.  Musculoskeletal: Negative for arthralgias.    Skin: Negative for rash and wound.  Neurological: Positive for headaches. Negative for seizures and syncope.  Hematological: Negative for adenopathy. Does not bruise/bleed easily.  Psychiatric/Behavioral: Negative for confusion.    Blood pressure 138/80, pulse 76, temperature 98.2 F (36.8 C), temperature source Temporal, resp. rate 16, height 5\' 1"  (1.549 m), weight 110 lb 9.6 oz (50.168 kg).   Physical Exam Physical Exam  Constitutional: She is oriented to person, place, and time. She appears well-developed and well-nourished. No distress.  HENT:   Head: Normocephalic and atraumatic.   Nose: Nose normal.   Mouth/Throat: No oropharyngeal exudate.       No tenderness or mass along temporal arteries. No adenopathy  Eyes: Conjunctivae and EOM are normal. Pupils are equal, round, and reactive to light. Left eye exhibits no discharge. No scleral icterus.  Neck: Neck supple. No JVD present. No tracheal deviation present. No thyromegaly present.  Cardiovascular: Normal rate, regular rhythm, normal heart sounds and intact distal pulses.    No murmur heard. Pulmonary/Chest: Effort normal and breath sounds normal. No respiratory distress. She has no wheezes. She has no rales. She exhibits no tenderness.  Lymphadenopathy:    She has no cervical adenopathy.  Neurological: She is alert and oriented to person, place, and time.  Skin: Skin is warm. No rash noted. She  is not diaphoretic. No erythema. No pallor.  Psychiatric: She has a normal mood and affect. Her behavior is normal. Judgment and thought content normal.    Data Reviewed I have reviewed Dr. Frederik Pear office notes and the lab work as well as a recent CT of the brain. Assessment   Headaches and visual disturbance. Suspect temporal arteritis.   Hyperlipidemia.   Plan Schedule left temporal artery biopsy under monitored sedation. The patient prefers sedation.   I have discussed the indications and details of surgery with the  patient and her husband. Risks and complications have been outlined, including but not limited to bleeding, infection, nerve damage with chronic pain numbness or facial asymmetry, cardiac pulmonary and thromboembolic problems. She seems to understand these issues well. At this time all of her questions were answered. She agrees with this plan.       Laden Fieldhouse M 09/14/2011, 11:54 AM                Not recorded       Discontinued Medications         Reason for Discontinue    fluticasone (FLONASE) 50 MCG/ACT nasal spray Error           Patient Instructions     You will be scheduled for a left temporal artery biopsy under anesthesia sedation.       Level of Service     PR OFFICE/OUTPT VISIT,NEW,LEVL III [id number]      Follow-up and Disposition     Routing History Recorded        All Flowsheet Templates (all recorded)     Encounter Vitals Flowsheet    Custom Formula Data Flowsheet    Anthropometrics Flowsheet               Referring Provider          Pecola Lawless, MD       All Charges for This Encounter       Code Description Service Date Service Provider Modifiers Quantity    [phone] PR OFFICE/OUTPT VISIT,NEW,LEVL III 09/14/2011 [name], MD   1    [phone] PR PT VIS [facility] USE EHR CER ATCB 09/14/2011 [name], MD   1    [phone] PR CURRENT TOBACCO NON-USER 09/14/2011 [name], MD   1        Other Encounter Related Information     Allergies & Medications         Problem List         History         Patient-Entered Questionnaires     No data filed

## 2011-09-19 NOTE — Telephone Encounter (Signed)
Rx sent 

## 2011-09-20 ENCOUNTER — Ambulatory Visit (HOSPITAL_COMMUNITY)
Admission: RE | Admit: 2011-09-20 | Discharge: 2011-09-20 | Disposition: A | Discharge status: Home / Self Care | Payer: Medicare Other | Source: Ambulatory Visit | Attending: General Surgery | Admitting: General Surgery

## 2011-09-20 ENCOUNTER — Encounter (HOSPITAL_COMMUNITY): Payer: Self-pay | Admitting: *Deleted

## 2011-09-20 ENCOUNTER — Other Ambulatory Visit (INDEPENDENT_AMBULATORY_CARE_PROVIDER_SITE_OTHER): Payer: Self-pay | Admitting: General Surgery

## 2011-09-20 ENCOUNTER — Encounter (HOSPITAL_COMMUNITY)
Admission: RE | Disposition: A | Discharge status: Home / Self Care | Payer: Self-pay | Source: Ambulatory Visit | Attending: General Surgery

## 2011-09-20 ENCOUNTER — Ambulatory Visit (HOSPITAL_COMMUNITY): Payer: Medicare Other | Admitting: *Deleted

## 2011-09-20 DIAGNOSIS — R51 Headache: Secondary | ICD-10-CM

## 2011-09-20 DIAGNOSIS — M316 Other giant cell arteritis: Secondary | ICD-10-CM | POA: Insufficient documentation

## 2011-09-20 DIAGNOSIS — IMO0002 Reserved for concepts with insufficient information to code with codable children: Secondary | ICD-10-CM | POA: Insufficient documentation

## 2011-09-20 HISTORY — PX: ARTERY BIOPSY: SHX891

## 2011-09-20 LAB — CBC
HCT: 41.7 % (ref 36.0–46.0)
Hemoglobin: 13.3 g/dL (ref 12.0–15.0)
MCH: 28.8 pg (ref 26.0–34.0)
MCHC: 31.9 g/dL (ref 30.0–36.0)
MCV: 90.3 fL (ref 78.0–100.0)
Platelets: 242 10*3/uL (ref 150–400)
RBC: 4.62 MIL/uL (ref 3.87–5.11)
RDW: 13.9 % (ref 11.5–15.5)
WBC: 10.2 10*3/uL (ref 4.0–10.5)

## 2011-09-20 LAB — SURGICAL PCR SCREEN
MRSA, PCR: NEGATIVE
Staphylococcus aureus: NEGATIVE

## 2011-09-20 SURGERY — BIOPSY TEMPORAL ARTERY
Anesthesia: Moderate Sedation | Site: Head | Laterality: Left | Wound class: Clean

## 2011-09-20 MED ORDER — PROMETHAZINE HCL 25 MG/ML IJ SOLN: 6.2500 mg | INTRAMUSCULAR | Status: DC | PRN

## 2011-09-20 MED ORDER — LACTATED RINGERS IV SOLN
INTRAVENOUS | Status: DC | PRN
  Administered 2011-09-20: 09:00:00 via INTRAVENOUS

## 2011-09-20 MED ORDER — MUPIROCIN 2 % EX OINT
TOPICAL_OINTMENT | Freq: Two times a day (BID) | CUTANEOUS | Status: DC
  Administered 2011-09-20: 08:00:00 via NASAL
  Filled 2011-09-20: qty 22

## 2011-09-20 MED ORDER — SUFENTANIL CITRATE 50 MCG/ML IV SOLN
INTRAVENOUS | Status: DC | PRN
  Administered 2011-09-20: 5 ug via INTRAVENOUS
  Administered 2011-09-20: 2.5 ug via INTRAVENOUS

## 2011-09-20 MED ORDER — HYDROMORPHONE HCL PF 1 MG/ML IJ SOLN: 0.2500 mg | INTRAMUSCULAR | Status: DC | PRN

## 2011-09-20 MED ORDER — MEPERIDINE HCL 25 MG/ML IJ SOLN: 6.2500 mg | INTRAMUSCULAR | Status: DC | PRN

## 2011-09-20 MED ORDER — LIDOCAINE-EPINEPHRINE (PF) 1 %-1:200000 IJ SOLN
INTRAMUSCULAR | Status: DC | PRN
  Administered 2011-09-20: 5 mL

## 2011-09-20 MED ORDER — MIDAZOLAM HCL 5 MG/5ML IJ SOLN
INTRAMUSCULAR | Status: DC | PRN
  Administered 2011-09-20: 1.5 mg via INTRAVENOUS
  Administered 2011-09-20: 1 mg via INTRAVENOUS

## 2011-09-20 SURGICAL SUPPLY — 45 items
ADH SKN CLS APL DERMABOND .7 (GAUZE/BANDAGES/DRESSINGS) ×1
BALL CTTN LRG ABS STRL LF (GAUZE/BANDAGES/DRESSINGS) ×1
BLADE SURG 15 STRL LF DISP TIS (BLADE) ×1 IMPLANT
BLADE SURG 15 STRL SS (BLADE) ×2
CHLORAPREP W/TINT 10.5 ML (MISCELLANEOUS) ×2 IMPLANT
CLOTH BEACON ORANGE TIMEOUT ST (SAFETY) ×2 IMPLANT
COTTONBALL LRG STERILE PKG (GAUZE/BANDAGES/DRESSINGS) ×2 IMPLANT
COVER SURGICAL LIGHT HANDLE (MISCELLANEOUS) ×2 IMPLANT
CRADLE DONUT ADULT HEAD (MISCELLANEOUS) ×2 IMPLANT
DERMABOND ADVANCED (GAUZE/BANDAGES/DRESSINGS) ×1
DERMABOND ADVANCED .7 DNX12 (GAUZE/BANDAGES/DRESSINGS) ×1 IMPLANT
DRAPE PED LAPAROTOMY (DRAPES) ×2 IMPLANT
DRAPE UTILITY 15X26 W/TAPE STR (DRAPE) ×4 IMPLANT
DRESSING TELFA 8X3 (GAUZE/BANDAGES/DRESSINGS) ×2 IMPLANT
ELECT CAUTERY BLADE 6.4 (BLADE) ×2 IMPLANT
ELECT NDL TIP 2.8 STRL (NEEDLE) ×1 IMPLANT
ELECT NEEDLE TIP 2.8 STRL (NEEDLE) ×2 IMPLANT
ELECT REM PT RETURN 9FT ADLT (ELECTROSURGICAL) ×2
ELECTRODE REM PT RTRN 9FT ADLT (ELECTROSURGICAL) ×1 IMPLANT
GAUZE SPONGE 4X4 16PLY XRAY LF (GAUZE/BANDAGES/DRESSINGS) ×2 IMPLANT
GLOVE BIO SURGEON STRL SZ7.5 (GLOVE) ×1 IMPLANT
GLOVE BIOGEL PI IND STRL 7.5 (GLOVE) IMPLANT
GLOVE BIOGEL PI INDICATOR 7.5 (GLOVE) ×1
GLOVE EUDERMIC 7 POWDERFREE (GLOVE) ×4 IMPLANT
GOWN PREVENTION PLUS XLARGE (GOWN DISPOSABLE) ×2 IMPLANT
GOWN STRL NON-REIN LRG LVL3 (GOWN DISPOSABLE) ×2 IMPLANT
KIT BASIN OR (CUSTOM PROCEDURE TRAY) ×2 IMPLANT
KIT ROOM TURNOVER OR (KITS) ×2 IMPLANT
NEEDLE 27GAX1X1/2 (NEEDLE) ×2 IMPLANT
NS IRRIG 1000ML POUR BTL (IV SOLUTION) ×2 IMPLANT
PACK SURGICAL SETUP 50X90 (CUSTOM PROCEDURE TRAY) ×2 IMPLANT
PAD ARMBOARD 7.5X6 YLW CONV (MISCELLANEOUS) ×2 IMPLANT
PENCIL BUTTON HOLSTER BLD 10FT (ELECTRODE) ×2 IMPLANT
SPECIMEN JAR SMALL (MISCELLANEOUS) ×2 IMPLANT
SUT MON AB 4-0 P3 18 (SUTURE) IMPLANT
SUT VIC AB 3-0 54X BRD REEL (SUTURE) ×1 IMPLANT
SUT VIC AB 3-0 BRD 54 (SUTURE) ×2
SUT VIC AB 4-0 P-3 18X BRD (SUTURE) IMPLANT
SUT VIC AB 4-0 P3 18 (SUTURE)
SUT VIC AB 4-0 RB1 27 (SUTURE)
SUT VIC AB 4-0 RB1 27X BRD (SUTURE) IMPLANT
SYR CONTROL 10ML LL (SYRINGE) ×2 IMPLANT
TOWEL OR 17X24 6PK STRL BLUE (TOWEL DISPOSABLE) ×2 IMPLANT
TOWEL OR 17X26 10 PK STRL BLUE (TOWEL DISPOSABLE) ×2 IMPLANT
WATER STERILE IRR 1000ML POUR (IV SOLUTION) IMPLANT

## 2011-09-20 NOTE — Preoperative (Signed)
Beta Blockers   Reason not to administer Beta Blockers:Not Applicable 

## 2011-09-20 NOTE — Anesthesia Postprocedure Evaluation (Signed)
  Anesthesia Post-op Note  Patient: Danielle Mcgee  Procedure(s) Performed:  BIOPSY TEMPORAL ARTERY  Patient Location: PACU  Anesthesia Type: MAC  Level of Consciousness: awake  Airway and Oxygen Therapy: Patient Spontanous Breathing  Post-op Pain: mild  Post-op Assessment: Post-op Vital signs reviewed  Post-op Vital Signs: stable  Complications: No apparent anesthesia complications

## 2011-09-20 NOTE — H&P (View-Only) (Signed)
 Danielle Mcgee   09/14/2011 11:00 AM Office Visit  MRN: [id number]   Description: 77 year old female  Provider: Tabias Swayze M, MD  Department: [facility]        Diagnoses     Temporal arteritis   - Primary    446.5      Reason for Visit     Other    Eval temporal artery bx        Vitals - Last Recorded       BP Pulse Temp(Src) Resp Ht Wt    138/80  76  98.2 F (36.8 C) (Temporal)  16  5' 1" (1.549 m)  110 lb 9.6 oz (50.168 kg)          BMI              20.90 kg/m2                 Progress Notes     Mahari Vankirk M, MD  09/14/2011 12:00 PM  SignedChief Complaint   Patient presents with   .  Other       Eval temporal artery bx      HPI Danielle Mcgee is a 77 y.o. female.  She is referred to me Dr. Bill Hopper  for consideration of temporal artery biopsy. Her ophthalmologist is Dr. Sig Gould.   The patient is fairly healthy. She has developed problems with left orbital headaches and left orbital pain. Recent eye exam suggested a retinal lesion. She has noticed some slight visual change in her left eye. Her sed rate and C. reactive protein are elevated. She has been placed on steroids recently and her headaches are now goneHPI    Past Medical History   Diagnosis  Date   .  DJD (degenerative joint disease)     .  Benign breast disease  2000   .  Hyperlipidemia     .  Mitral click-murmur syndrome         nomitral  reguritation   .  Osteopenia     .  Generalized headaches         Past Surgical History   Procedure  Date   .  Breast biopsy  2000       Atypical Lobular Hyperplasia   .  Total knee arthroplasty         bilaterally   .  Colonoscopy  2002       Dr Brodie, due 2012   .  Cataract extraction         bilaterally   .  Appendectomy  1962   .  Joint replacement  2001       both       Family History   Problem  Relation  Age of Onset   .  Cancer  Mother         BREAST   .  Heart disease  Mother         MI   .  Osteoporosis   Mother     .  Coronary artery disease  Father     .  Heart disease  Sister         CABG   .  Cancer  Sister         CERVICAL      Social History History   Substance Use Topics   .  Smoking status:  Never Smoker    .  Smokeless tobacco:  Never Used   .    Alcohol Use:  Yes          occasionally in social setting       Allergies   Allergen  Reactions   .  Codeine         REACTION: GI PROBLEMS       Current Outpatient Prescriptions   Medication  Sig  Dispense  Refill   .  aspirin 81 MG tablet  Take 81 mg by mouth daily.           .  Calcium Carbonate-Vitamin D (CALCIUM + D PO)  Take 1,200 mg by mouth daily.           .  Cholecalciferol (VITAMIN D) 2000 UNITS tablet  Take 2,000 Units by mouth daily.           .  gabapentin (NEURONTIN) 100 MG capsule  Take 1 capsule (100 mg total) by mouth as directed. One every 8 hours as needed for headache   30 capsule   2   .  LORazepam (ATIVAN) 0.5 MG tablet  Take 1 tablet at bedtime as needed   30 tablet   0   .  Multiple Vitamins-Minerals (CENTRUM SILVER PO)  Take 1 tablet by mouth daily.           .  predniSONE (DELTASONE) 20 MG tablet  Take 1 tablet (20 mg total) by mouth as directed. 1/2 tid with food   15 tablet   0   .  raloxifene (EVISTA) 60 MG tablet  Take 60 mg by mouth daily.              Review of Systems Review of Systems  Constitutional: Negative for fever, chills and unexpected weight change.  HENT: Negative for hearing loss, congestion, sore throat, trouble swallowing and voice change.   Eyes: Positive for pain and visual disturbance.  Respiratory: Negative for cough and wheezing.   Cardiovascular: Negative for chest pain, palpitations and leg swelling.  Gastrointestinal: Negative for nausea, vomiting, abdominal pain, diarrhea, constipation, blood in stool, abdominal distention and anal bleeding.  Genitourinary: Negative for hematuria, vaginal bleeding and difficulty urinating.  Musculoskeletal: Negative for arthralgias.    Skin: Negative for rash and wound.  Neurological: Positive for headaches. Negative for seizures and syncope.  Hematological: Negative for adenopathy. Does not bruise/bleed easily.  Psychiatric/Behavioral: Negative for confusion.    Blood pressure 138/80, pulse 76, temperature 98.2 F (36.8 C), temperature source Temporal, resp. rate 16, height 5' 1" (1.549 m), weight 110 lb 9.6 oz (50.168 kg).   Physical Exam Physical Exam  Constitutional: She is oriented to person, place, and time. She appears well-developed and well-nourished. No distress.  HENT:   Head: Normocephalic and atraumatic.   Nose: Nose normal.   Mouth/Throat: No oropharyngeal exudate.       No tenderness or mass along temporal arteries. No adenopathy  Eyes: Conjunctivae and EOM are normal. Pupils are equal, round, and reactive to light. Left eye exhibits no discharge. No scleral icterus.  Neck: Neck supple. No JVD present. No tracheal deviation present. No thyromegaly present.  Cardiovascular: Normal rate, regular rhythm, normal heart sounds and intact distal pulses.    No murmur heard. Pulmonary/Chest: Effort normal and breath sounds normal. No respiratory distress. She has no wheezes. She has no rales. She exhibits no tenderness.  Lymphadenopathy:    She has no cervical adenopathy.  Neurological: She is alert and oriented to person, place, and time.  Skin: Skin is warm. No rash noted. She   is not diaphoretic. No erythema. No pallor.  Psychiatric: She has a normal mood and affect. Her behavior is normal. Judgment and thought content normal.    Data Reviewed I have reviewed Dr. Hopper's office notes and the lab work as well as a recent CT of the brain. Assessment   Headaches and visual disturbance. Suspect temporal arteritis.   Hyperlipidemia.   Plan Schedule left temporal artery biopsy under monitored sedation. The patient prefers sedation.   I have discussed the indications and details of surgery with the  patient and her husband. Risks and complications have been outlined, including but not limited to bleeding, infection, nerve damage with chronic pain numbness or facial asymmetry, cardiac pulmonary and thromboembolic problems. She seems to understand these issues well. At this time all of her questions were answered. She agrees with this plan.       Xaria Judon M 09/14/2011, 11:54 AM                Not recorded       Discontinued Medications         Reason for Discontinue    fluticasone (FLONASE) 50 MCG/ACT nasal spray Error           Patient Instructions     You will be scheduled for a left temporal artery biopsy under anesthesia sedation.       Level of Service     PR OFFICE/OUTPT VISIT,NEW,LEVL III [99203]      Follow-up and Disposition     Routing History Recorded        All Flowsheet Templates (all recorded)     Encounter Vitals Flowsheet    Custom Formula Data Flowsheet    Anthropometrics Flowsheet               Referring Provider          William F Hopper, MD       All Charges for This Encounter       Code Description Service Date Service Provider Modifiers Quantity    99203 PR OFFICE/OUTPT VISIT,NEW,LEVL III 09/14/2011 Soraiya Ahner M Chemere Steffler, MD   1    [id number] PR PT VIS DOC USE EHR CER ATCB 09/14/2011 Calen Posch M Shanielle Correll, MD   1    [id number] PR CURRENT TOBACCO NON-USER 09/14/2011 Alejandra Hunt M Samamtha Tiegs, MD   1        Other Encounter Related Information     Allergies & Medications         Problem List         History         Patient-Entered Questionnaires     No data filed         

## 2011-09-20 NOTE — Anesthesia Postprocedure Evaluation (Signed)
  Anesthesia Post-op Note  Patient: Danielle Mcgee  Procedure(s) Performed:  BIOPSY TEMPORAL ARTERY  Patient Location: PACU  Anesthesia Type: MAC  Level of Consciousness: awake and alert   Airway and Oxygen Therapy: Patient Spontanous Breathing  Post-op Pain: none  Post-op Assessment: Post-op Vital signs reviewed, Patient's Cardiovascular Status Stable, Respiratory Function Stable, Patent Airway and No signs of Nausea or vomiting  Post-op Vital Signs: stable  Complications: No apparent anesthesia complications

## 2011-09-20 NOTE — Op Note (Signed)
Patient Name:   Danielle Mcgee  Date of Surgery:   09/20/2011  Pre op Diagnosis:   Temporal arteritis  Post op Diagnosis:   Temporal arteritis  Procedure:    Left temporal artery biopsy  Surgeon:   Angelia Mould. Derrell Lolling, M.D., FACS  Assistant:   none  Operative Indications:   This is a 77 year old Caucasian female who has developed problems with left orbital headaches and left orbital pain. Recent eye exam suggested a retinal lesion. Sedimentation rate and C-reactive protein are elevated. She has been on steroids recently and her headaches have resolved. She was evaluated as an outpatient and was referred for consideration of temporal artery biopsy. She is brought to the operating room electively  Operative Findings:   The left temporal artery and tissues were very fibrotic and sticky. The dissection was very difficult. We were ultimately able to isolate the main temporal artery, anterior branch and posterior branch.  Procedure in Detail:   The patient was taken to the operating room. She was monitored and sedated by the anesthesia department. The left face and temporal area were prepped and draped in a sterile fashion. Surgical timeout was held. 1% Xylocaine with epinephrine was used as a local infiltration anesthetic. A Doppler was used and I could hear the arterial Doppler signal inferiorly in front of the ear I could not trace it more proximally. There were some scars up the left temporal area where she had had a face lift. A linear incision was made in front of the ear. Dissection was carried down into the subcutaneous tissue. A slow tedious dissection with careful spreading and sharp dissection lead to identification of the artery.Marland Kitchen Ultimately I identified the anterior branch of the temporal artery, posterior branch, and the main trunk. The tissues were somewhat elastic and very tortuous consistent with the artery. We clamped the branches proximally and distally and divided the artery and sent  it for routine histology. The ends of the artery were tied off with 4-0 Vicryl ties. There was no bleeding. Subcutaneous tissue was carefully closed with interrupted 3-0 Vicryl sutures and the skin was closed with a running subcuticular suture of 4-0 Monocryl and Dermabond. EBL  was 5 cc. complications none. Sponge needle and sharp counts were correct.   [name] 09/20/2011 9:28 AM

## 2011-09-20 NOTE — H&P (Signed)
 Danielle Mcgee   09/14/2011 11:00 AM Office Visit  MRN: [id number]   Description: 77 year old female  Provider: Joh Rao M, MD  Department: [facility]        Diagnoses     Temporal arteritis   - Primary    446.5      Reason for Visit     Other    Eval temporal artery bx        Vitals - Last Recorded       BP Pulse Temp(Src) Resp Ht Wt    138/80  76  98.2 F (36.8 C) (Temporal)  16  5' 1" (1.549 m)  110 lb 9.6 oz (50.168 kg)          BMI              20.90 kg/m2                 Progress Notes     Amberlyn Martinezgarcia M, MD  09/14/2011 12:00 PM  SignedChief Complaint   Patient presents with   .  Other       Eval temporal artery bx      HPI Danielle Mcgee is a 77 y.o. female.  She is referred to me Dr. Bill Hopper  for consideration of temporal artery biopsy. Her ophthalmologist is Dr. Sig Gould.   The patient is fairly healthy. She has developed problems with left orbital headaches and left orbital pain. Recent eye exam suggested a retinal lesion. She has noticed some slight visual change in her left eye. Her sed rate and C. reactive protein are elevated. She has been placed on steroids recently and her headaches are now goneHPI    Past Medical History   Diagnosis  Date   .  DJD (degenerative joint disease)     .  Benign breast disease  2000   .  Hyperlipidemia     .  Mitral click-murmur syndrome         nomitral  reguritation   .  Osteopenia     .  Generalized headaches         Past Surgical History   Procedure  Date   .  Breast biopsy  2000       Atypical Lobular Hyperplasia   .  Total knee arthroplasty         bilaterally   .  Colonoscopy  2002       Dr Brodie, due 2012   .  Cataract extraction         bilaterally   .  Appendectomy  1962   .  Joint replacement  2001       both       Family History   Problem  Relation  Age of Onset   .  Cancer  Mother         BREAST   .  Heart disease  Mother         MI   .  Osteoporosis   Mother     .  Coronary artery disease  Father     .  Heart disease  Sister         CABG   .  Cancer  Sister         CERVICAL      Social History History   Substance Use Topics   .  Smoking status:  Never Smoker    .  Smokeless tobacco:  Never Used   .    Alcohol Use:  Yes          occasionally in social setting       Allergies   Allergen  Reactions   .  Codeine         REACTION: GI PROBLEMS       Current Outpatient Prescriptions   Medication  Sig  Dispense  Refill   .  aspirin 81 MG tablet  Take 81 mg by mouth daily.           .  Calcium Carbonate-Vitamin D (CALCIUM + D PO)  Take 1,200 mg by mouth daily.           .  Cholecalciferol (VITAMIN D) 2000 UNITS tablet  Take 2,000 Units by mouth daily.           .  gabapentin (NEURONTIN) 100 MG capsule  Take 1 capsule (100 mg total) by mouth as directed. One every 8 hours as needed for headache   30 capsule   2   .  LORazepam (ATIVAN) 0.5 MG tablet  Take 1 tablet at bedtime as needed   30 tablet   0   .  Multiple Vitamins-Minerals (CENTRUM SILVER PO)  Take 1 tablet by mouth daily.           .  predniSONE (DELTASONE) 20 MG tablet  Take 1 tablet (20 mg total) by mouth as directed. 1/2 tid with food   15 tablet   0   .  raloxifene (EVISTA) 60 MG tablet  Take 60 mg by mouth daily.              Review of Systems Review of Systems  Constitutional: Negative for fever, chills and unexpected weight change.  HENT: Negative for hearing loss, congestion, sore throat, trouble swallowing and voice change.   Eyes: Positive for pain and visual disturbance.  Respiratory: Negative for cough and wheezing.   Cardiovascular: Negative for chest pain, palpitations and leg swelling.  Gastrointestinal: Negative for nausea, vomiting, abdominal pain, diarrhea, constipation, blood in stool, abdominal distention and anal bleeding.  Genitourinary: Negative for hematuria, vaginal bleeding and difficulty urinating.  Musculoskeletal: Negative for arthralgias.    Skin: Negative for rash and wound.  Neurological: Positive for headaches. Negative for seizures and syncope.  Hematological: Negative for adenopathy. Does not bruise/bleed easily.  Psychiatric/Behavioral: Negative for confusion.    Blood pressure 138/80, pulse 76, temperature 98.2 F (36.8 C), temperature source Temporal, resp. rate 16, height 5' 1" (1.549 m), weight 110 lb 9.6 oz (50.168 kg).   Physical Exam Physical Exam  Constitutional: She is oriented to person, place, and time. She appears well-developed and well-nourished. No distress.  HENT:   Head: Normocephalic and atraumatic.   Nose: Nose normal.   Mouth/Throat: No oropharyngeal exudate.       No tenderness or mass along temporal arteries. No adenopathy  Eyes: Conjunctivae and EOM are normal. Pupils are equal, round, and reactive to light. Left eye exhibits no discharge. No scleral icterus.  Neck: Neck supple. No JVD present. No tracheal deviation present. No thyromegaly present.  Cardiovascular: Normal rate, regular rhythm, normal heart sounds and intact distal pulses.    No murmur heard. Pulmonary/Chest: Effort normal and breath sounds normal. No respiratory distress. She has no wheezes. She has no rales. She exhibits no tenderness.  Lymphadenopathy:    She has no cervical adenopathy.  Neurological: She is alert and oriented to person, place, and time.  Skin: Skin is warm. No rash noted. She   is not diaphoretic. No erythema. No pallor.  Psychiatric: She has a normal mood and affect. Her behavior is normal. Judgment and thought content normal.    Data Reviewed I have reviewed Dr. Hopper's office notes and the lab work as well as a recent CT of the brain. Assessment   Headaches and visual disturbance. Suspect temporal arteritis.   Hyperlipidemia.   Plan Schedule left temporal artery biopsy under monitored sedation. The patient prefers sedation.   I have discussed the indications and details of surgery with the  patient and her husband. Risks and complications have been outlined, including but not limited to bleeding, infection, nerve damage with chronic pain numbness or facial asymmetry, cardiac pulmonary and thromboembolic problems. She seems to understand these issues well. At this time all of her questions were answered. She agrees with this plan.       Ruey Storer M 09/14/2011, 11:54 AM                Not recorded       Discontinued Medications         Reason for Discontinue    fluticasone (FLONASE) 50 MCG/ACT nasal spray Error           Patient Instructions     You will be scheduled for a left temporal artery biopsy under anesthesia sedation.       Level of Service     PR OFFICE/OUTPT VISIT,NEW,LEVL III [99203]      Follow-up and Disposition     Routing History Recorded        All Flowsheet Templates (all recorded)     Encounter Vitals Flowsheet    Custom Formula Data Flowsheet    Anthropometrics Flowsheet               Referring Provider          William F Hopper, MD       All Charges for This Encounter       Code Description Service Date Service Provider Modifiers Quantity    99203 PR OFFICE/OUTPT VISIT,NEW,LEVL III 09/14/2011 Helmut Hennon M Michiel Sivley, MD   1    G8447 PR PT VIS DOC USE EHR CER ATCB 09/14/2011 Kerby Hockley M Abdulaziz Toman, MD   1    1036F PR CURRENT TOBACCO NON-USER 09/14/2011 Abdishakur Gottschall M Insiya Oshea, MD   1        Other Encounter Related Information     Allergies & Medications         Problem List         History         Patient-Entered Questionnaires     No data filed         

## 2011-09-20 NOTE — Anesthesia Preprocedure Evaluation (Addendum)
Anesthesia Evaluation  Patient identified by MRN, date of birth, ID band Patient awake    Reviewed: Allergy & Precautions, H&P , NPO status , Patient's Chart, lab work & pertinent test results  Airway Mallampati: II TM Distance: >3 FB Neck ROM: full    Dental No notable dental hx. (+) Teeth Intact   Pulmonary neg pulmonary ROS,  clear to auscultation  Pulmonary exam normal       Cardiovascular neg cardio ROS regular Normal    Neuro/Psych  Headaches, Negative Neurological ROS  Negative Psych ROS   GI/Hepatic negative GI ROS, Neg liver ROS,   Endo/Other  Negative Endocrine ROS  Renal/GU negative Renal ROS  Genitourinary negative   Musculoskeletal   Abdominal   Peds  Hematology negative hematology ROS (+)   Anesthesia Other Findings   Reproductive/Obstetrics negative OB ROS                          Anesthesia Physical Anesthesia Plan  ASA: II  Anesthesia Plan: MAC   Post-op Pain Management:    Induction: Intravenous  Airway Management Planned: Nasal Cannula and Simple Face Mask  Additional Equipment:   Intra-op Plan:   Post-operative Plan:   Informed Consent: I have reviewed the patients History and Physical, chart, labs and discussed the procedure including the risks, benefits and alternatives for the proposed anesthesia with the patient or authorized representative who has indicated his/her understanding and acceptance.   Dental advisory given  Plan Discussed with: CRNA  Anesthesia Plan Comments:       Anesthesia Quick Evaluation

## 2011-09-20 NOTE — Transfer of Care (Signed)
Immediate Anesthesia Transfer of Care Note  Patient: Danielle Mcgee  Procedure(s) Performed:  BIOPSY TEMPORAL ARTERY  Patient Location: PACU  Anesthesia Type: MAC  Level of Consciousness: awake  Airway & Oxygen Therapy: Patient Spontanous Breathing  Post-op Assessment: Report given to PACU RN and Post -op Vital signs reviewed and stable  Post vital signs: stable  Complications: No apparent anesthesia complications

## 2011-09-20 NOTE — Interval H&P Note (Signed)
History and Physical Interval Note:   09/20/2011   7:58 AM   Danielle Mcgee  has presented today for surgery, with the diagnosis of headaches  The various methods of treatment have been discussed with the patient and family. After consideration of risks, benefits and other options for treatment, the patient has consented to  Procedure(s): BIOPSY TEMPORAL ARTERY as a surgical intervention .  The patients' history has been reviewed, patient examined, no change in status, stable for surgery.  I have reviewed the patients' chart and labs.  Questions were answered to the patient's satisfaction.     [name]  MD

## 2011-09-21 ENCOUNTER — Telehealth (INDEPENDENT_AMBULATORY_CARE_PROVIDER_SITE_OTHER): Payer: Self-pay

## 2011-09-21 ENCOUNTER — Encounter (HOSPITAL_COMMUNITY): Payer: Self-pay | Admitting: General Surgery

## 2011-09-21 NOTE — Telephone Encounter (Signed)
Pathology report faxed to Dr Alwyn Ren per Dr Jacinto Halim request. Dr Alwyn Ren to review and advise pt of result and treatment plan.

## 2011-09-22 ENCOUNTER — Ambulatory Visit (INDEPENDENT_AMBULATORY_CARE_PROVIDER_SITE_OTHER): Payer: Self-pay | Admitting: General Surgery

## 2011-09-22 ENCOUNTER — Other Ambulatory Visit: Payer: Self-pay | Admitting: Internal Medicine

## 2011-09-25 ENCOUNTER — Telehealth: Payer: Self-pay | Admitting: *Deleted

## 2011-09-25 NOTE — Telephone Encounter (Signed)
OK x 1 

## 2011-09-25 NOTE — Telephone Encounter (Signed)
Pt is requesting result of her temporal biopsy..Please advise

## 2011-09-25 NOTE — Telephone Encounter (Signed)
Dr.Hopper please advise on request for prednisone

## 2011-09-25 NOTE — Telephone Encounter (Signed)
Dr.Hopper spoke with patient-dicussed report, patient scheduled to see Dr.Hopper tomorrow at 4:00pm

## 2011-09-26 ENCOUNTER — Ambulatory Visit (INDEPENDENT_AMBULATORY_CARE_PROVIDER_SITE_OTHER): Payer: Medicare Other | Admitting: Internal Medicine

## 2011-09-26 ENCOUNTER — Encounter: Payer: Self-pay | Admitting: Neurology

## 2011-09-26 ENCOUNTER — Encounter: Payer: Self-pay | Admitting: Internal Medicine

## 2011-09-26 ENCOUNTER — Ambulatory Visit (INDEPENDENT_AMBULATORY_CARE_PROVIDER_SITE_OTHER): Payer: Medicare Other | Admitting: Neurology

## 2011-09-26 VITALS — BP 152/60 | HR 64 | Ht 61.25 in | Wt 109.6 lb

## 2011-09-26 DIAGNOSIS — M316 Other giant cell arteritis: Secondary | ICD-10-CM

## 2011-09-26 DIAGNOSIS — M949 Disorder of cartilage, unspecified: Secondary | ICD-10-CM

## 2011-09-26 DIAGNOSIS — M899 Disorder of bone, unspecified: Secondary | ICD-10-CM

## 2011-09-26 MED ORDER — LORAZEPAM 0.5 MG PO TABS: ORAL_TABLET | ORAL | Status: DC

## 2011-09-26 MED ORDER — PREDNISONE 10 MG PO TABS: 30.0000 mg | ORAL_TABLET | Freq: Every day | ORAL | Status: AC

## 2011-09-26 NOTE — Patient Instructions (Addendum)
I shall send copies of this report to your Gynecologist and Ophthalmologist. You should be on 20 mg of prednisone twice a day until visual symptoms have improved and the  sedimentation rate is normal. I will ask Dr. Laurie Panda to FAX  actual bone density images as well as the data for review. Her BMET and sedimentation rate should be monitored monthly while on high-dose steroids. Please bring these instructions to that Lab appt.

## 2011-09-26 NOTE — Progress Notes (Signed)
  Subjective:    Patient ID: Danielle Mcgee, female    DOB: 03/21/1930, 77 y.o.   MRN: [id number]  HPI The temporal artery biopsy does demonstrate temporal arteritis. Her initial sedimentation rate was 60; she had started 60 mg daily x2 from her ophthalmologist. Subsequently she's been on 20 mg one half tabs a day. Significantly she has osteopenia; she is unsure of T scores. Her last bone density was in August 2012 by her gynecologist. Her vitamin D level at that time was therapeutic at 55. By history she's taken Boniva for 5 years.  Significantly the severe headaches on the left have resolved. She believes the visual symptoms are essentially stable. She will see her ophthalmologist in the morning  Dr. Nash Dimmer evaluation was reviewed. He questions TIAs related to the right internal carotid artery rather than giant cell arteritis process which would be less likely.      Review of Systems  She describes being "keyed up" on steroids; she made a comment she" felt like Lynden Oxford and could fly. "      Objective:   Physical Exam she is in no acute distress. She is intelligent and comprehends the pathophysiology of this process. She did not exhibit anxiety disproportionate to the situation.  She has minimal degenerative changes in the DIP joints of her hands. There is mild  lordosis of the mid thoracic spine but there is no significant scoliosis of the spine.        Assessment & Plan:  #1 temporal arteritis, biopsy-proven. Headaches have resolved that visual symptoms persist. Sedimentation rate is pending. With the persistent visual symptoms, the prednisone will be increased to 20 mg twice a day now that we have a definitive diagnosis. This can be slowly weaned if the sedimentation rate returns to normal and the visual symptoms stabilize and improve. Unfortunately the steroid administration encompass 18-24 months with slow weaning.  #2 possible right internal carotid artery related TIAs;  definitive workup to be completed by her neurologist.  #3 osteopenia; status post 5 years of Boniva. It will be critical to review the data and actual images of the bone density study. This should be monitored annually for at least the next 2 years. Supplemental calcium and vitamin D should be continued. The vitamin D level should be checked annually as well.

## 2011-09-26 NOTE — Progress Notes (Signed)
Dear Dr. Alwyn Ren,  Thank you for having me see Danielle Mcgee in consultation today at [facility] for her problem with right hemibody numbness.  As you may recall, she is a 77 y.o. 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  They rarely involve her leg as well.  They do not involve vision, but are accompanied by jerking or trembling motions of the right hand.  They immediately remit when she moves her body back to the center.  These seem to be getting more frequent occuring at least once per day.  About 1 month ago she began to complain of jaw claudication and severe bilateral headaches as well as changes in her left sided vision.  She was seen by her ophthalmologist, Dr. Elise Benne and felt to have what sounds like a NAION or BRAO of the left eye.  He put her immediately on steroids, at a dose of 60mg  a day, and then reduced after two days to 10mg  tid.  She has been on that dose since then with quiescence of her jaw claudication.  Notably an ESR done before the institution of the steroids was 60.  She had a temporal artery biopsy of the left temporal artery10/14  which confirmed the diagnosis of temporal arteritis.  Past Medical History  Diagnosis Date  . DJD (degenerative joint disease)   . Benign breast disease 2000  . Hyperlipidemia   . Mitral click-murmur syndrome     nomitral  reguritation  . Osteopenia   . Generalized headaches     Past Surgical History  Procedure Date  . Breast biopsy 2000    Atypical Lobular Hyperplasia  . Total knee arthroplasty     bilaterally  . Colonoscopy 2002    Dr Juanda Chance, due 2012  . Cataract extraction     bilaterally  . Appendectomy 1962  . Joint replacement 2001    both  . Artery biopsy 09/20/2011   Procedure: BIOPSY TEMPORAL ARTERY;  Surgeon: [name], MD;  Location: [facility] OR;  Service: General;  Laterality: Left;    History   Social History  . Marital Status: Married    Spouse Name: N/A    Number of Children: N/A  . Years of Education: N/A   Social History Main Topics  . Smoking status: Never Smoker   . Smokeless tobacco: Never Used  . Alcohol Use: Yes      occasionally in social setting  . Drug Use: No  . Sexually Active: None   Other Topics Concern  . None   Social History Narrative  . None    Family History  Problem Relation Age of Onset  . Cancer Mother     BREAST  . Heart disease Mother     MI  . Osteoporosis Mother   . Coronary artery disease Father   . Heart disease Sister     CABG  . Cancer Sister     CERVICAL    Current Outpatient Prescriptions on File Prior to Visit  Medication Sig Dispense Refill  . aspirin 81 MG tablet Take 81 mg by mouth daily.        . Calcium Carbonate-Vitamin D (CALCIUM + D PO) Take 1,200 mg by mouth daily.        Marland Kitchen  Cholecalciferol (VITAMIN D) 2000 UNITS tablet Take 2,000 Units by mouth daily.       Marland Kitchen LORazepam (ATIVAN) 0.5 MG tablet Take 1 tablet at bedtime as needed  30 tablet  1  . Multiple Vitamins-Minerals (CENTRUM SILVER PO) Take 1 tablet by mouth daily.        . predniSONE (DELTASONE) 20 MG tablet Take 10 mg by mouth 3 (three) times daily.        . predniSONE (DELTASONE) 20 MG tablet TAKE 1/2 TABLET BY MOUTH 3 TIMES DAILY WITH FOOD  15 tablet  0  . raloxifene (EVISTA) 60 MG tablet Take 60 mg by mouth daily.        Marland Kitchen gabapentin (NEURONTIN) 100 MG capsule Take 1 capsule (100 mg total) by mouth as directed. One every 8 hours as needed for headache  30 capsule  2    Allergies  Allergen Reactions  . Codeine     REACTION: GI PROBLEMS      ROS:  13 systems were reviewed and are notable for bilateral arthritis requiring knee replacements.  All other review of systems are unremarkable.   Examination:  Filed  Vitals:   09/26/11 1410  BP: 152/60  Pulse: 64  Height: 5' 1.25" (1.556 m)  Weight: 109 lb 9.6 oz (49.714 kg)     In general, very well appearing woman in NAD.  Cardiovascular: The patient has a irregularly irregular and no carotid bruits.  Fundoscopy:  Disks are flat. Vessel caliber within normal limits.  I could not appreciate optic disk swelling on the left, but I did not dilate her eyes today.  Mental status:   The patient is oriented to person, place and time. Recent and remote memory are intact. Attention span and concentration are normal. Language including repetition, naming, following commands are intact. Fund of knowledge of current and historical events, as well as vocabulary are normal.  Cranial Nerves: Pupils are equally round and reactive to light. Visual fields full to confrontation. Extraocular movements are intact without nystagmus, although she does appear esotropic at times. Facial sensation and muscles of mastication are intact. Muscles of facial expression are symmetric. Hearing intact to bilateral finger rub. Tongue protrusion, uvula, palate midline, although the left palate does not seem to elevated as well as the right.  Shoulder shrug intact  Motor:  The patient has normal bulk and tone, ?mild pronator drift on the left.  There are no adventitious movements.  5/5 bilaterally.  Reflexes:  Brisk throughout, perhaps slightly brisker in the right upper extremity.  Toes down  Coordination:  Normal finger to nose.  No dysdiadokinesia.  Sensation is intact to temperature and vibration.  Gait and Station are normal.  Tandem gait is intact.  Romberg is mildly imbalanced.   Impression/Recs: With respect to the spells of hemibody numbness/jerking/shaking I am concerned for limb shaking TIAs from large vessel disease of the right ICA. If we confirm this disease with a CTA of her head and neck, we will then have to decided whether this is secondary to  atherosclerosis or giant cell arteritis although it is unusual for the ICA to be involved in GCA.  In any case, we would have to be very careful about a potential intervention for any stenosis given the possibility some of the changes may be arteritic.  With respect to the GCA,  I will leave it up to you about who you want to manage this.  For now I have told her she can take  30mg  of prednisone a day.  I would probably keep this going until she sees me back in 4 weeks, and if she continues to be symptom free of her GCA then I would institute at taper of 5mg /d/month.  I have taken the liberty of checking an ESR for [place] to follow today.  However, if you would rather follow her GCA or have rheumatology follow it, that would be fine as well.  We are also going to get the records from Dr. Dellia Nims office.   We will see the patient back in 4 weeks.  Thank you for having [place] see Danielle Mcgee in consultation.  Feel free to contact me with any questions.  Lupita Raider Modesto Charon, MD [facility], [place] [address] [place], [place] [address] Phone: [phone] Fax: [phone].

## 2011-09-26 NOTE — Patient Instructions (Signed)
Your CTA head and neck is scheduled for Tuesday, November 27th at 12:00noon.  Please arrive to [facility] by 11:45am.  First floor admitting.  [phone].  Nothing or drink 4 hours before.  Come by tomorrow before 2:00pm to have labs drawn in the basement.

## 2011-09-27 LAB — SEDIMENTATION RATE: Sed Rate: 7 mm/hr (ref 0–22)

## 2011-10-03 ENCOUNTER — Ambulatory Visit (HOSPITAL_COMMUNITY)
Admission: RE | Admit: 2011-10-03 | Discharge: 2011-10-03 | Disposition: A | Discharge status: Home / Self Care | Payer: Medicare Other | Source: Ambulatory Visit | Attending: Neurology | Admitting: Neurology

## 2011-10-03 ENCOUNTER — Other Ambulatory Visit: Payer: Self-pay | Admitting: Family Medicine

## 2011-10-03 DIAGNOSIS — M316 Other giant cell arteritis: Secondary | ICD-10-CM

## 2011-10-03 DIAGNOSIS — Z01818 Encounter for other preprocedural examination: Secondary | ICD-10-CM

## 2011-10-03 DIAGNOSIS — Z9289 Personal history of other medical treatment: Secondary | ICD-10-CM

## 2011-10-03 DIAGNOSIS — H5460 Unqualified visual loss, one eye, unspecified: Secondary | ICD-10-CM | POA: Insufficient documentation

## 2011-10-03 LAB — BASIC METABOLIC PANEL
BUN: 25 mg/dL — ABNORMAL HIGH (ref 6–23)
CO2: 27 mEq/L (ref 19–32)
Calcium: 9.4 mg/dL (ref 8.4–10.5)
Chloride: 102 mEq/L (ref 96–112)
Creatinine, Ser: 0.68 mg/dL (ref 0.50–1.10)
GFR calc Af Amer: >90 mL/min (ref >90)
GFR calc non Af Amer: 81 mL/min — ABNORMAL LOW (ref >90)
Glucose, Bld: 93 mg/dL (ref 70–99)
Potassium: 5.1 mEq/L (ref 3.5–5.1)
Sodium: 138 mEq/L (ref 135–145)

## 2011-10-04 ENCOUNTER — Ambulatory Visit (HOSPITAL_COMMUNITY)
Admission: RE | Admit: 2011-10-04 | Discharge: 2011-10-04 | Disposition: A | Discharge status: Home / Self Care | Payer: Medicare Other | Source: Ambulatory Visit | Attending: Neurology | Admitting: Neurology

## 2011-10-04 ENCOUNTER — Other Ambulatory Visit (HOSPITAL_COMMUNITY): Payer: Medicare Other

## 2011-10-04 DIAGNOSIS — H547 Unspecified visual loss: Secondary | ICD-10-CM | POA: Insufficient documentation

## 2011-10-04 DIAGNOSIS — M316 Other giant cell arteritis: Secondary | ICD-10-CM | POA: Insufficient documentation

## 2011-10-04 MED ORDER — IOHEXOL 350 MG/ML SOLN
50.0000 mL | Freq: Once | INTRAVENOUS | Status: AC | PRN
  Administered 2011-10-04: 50 mL via INTRAVENOUS

## 2011-10-06 ENCOUNTER — Telehealth: Payer: Self-pay | Admitting: Neurology

## 2011-10-06 NOTE — Telephone Encounter (Signed)
Pt called for results of CTA of head and neck. Very concerned about having to wait over the weekend for these results.

## 2011-10-09 ENCOUNTER — Telehealth: Payer: Self-pay | Admitting: Internal Medicine

## 2011-10-09 NOTE — Telephone Encounter (Signed)
Ok for patient to be seen tomorrow

## 2011-10-09 NOTE — Telephone Encounter (Signed)
Pt says she is not happy about this but will come tomorrow at 1pm

## 2011-10-10 ENCOUNTER — Ambulatory Visit (INDEPENDENT_AMBULATORY_CARE_PROVIDER_SITE_OTHER): Payer: Medicare Other | Admitting: Internal Medicine

## 2011-10-10 ENCOUNTER — Encounter: Payer: Self-pay | Admitting: Internal Medicine

## 2011-10-10 DIAGNOSIS — M316 Other giant cell arteritis: Secondary | ICD-10-CM

## 2011-10-10 DIAGNOSIS — T887XXA Unspecified adverse effect of drug or medicament, initial encounter: Secondary | ICD-10-CM

## 2011-10-10 LAB — POTASSIUM: Potassium: 5.4 mEq/L — ABNORMAL HIGH (ref 3.5–5.1)

## 2011-10-10 MED ORDER — PREDNISONE 5 MG PO TABS: 5.0000 mg | ORAL_TABLET | Freq: Every day | ORAL | Status: DC

## 2011-10-10 NOTE — Patient Instructions (Addendum)
Decrease the prednisone by 2.5 mg every 10 days to a total dose of 30 mg

## 2011-10-10 NOTE — Telephone Encounter (Signed)
I think this is likely [place]'s TIA.  However, CTA looks good.  No obvious stenosis and certainly no obvious abnormalities attributable to her temporal arteritis.  In order to pin point the area of stenosis we would need to do a angiogram under dynamic conditions and I think that this is probably not worthwhile given the reversibility of the deficit when it comes on.  She should stay on the aspirin.  If these spells get worse, then she is going to call me and let me know.

## 2011-10-10 NOTE — Progress Notes (Signed)
  Subjective:    Patient ID: Danielle Mcgee, female    DOB: May 29, 1930, 77 y.o.   MRN: [id number]  HPI Her ophthalmologist is pleased with her progress; there is  visible  improvement in the retinal vessels. Her vision has also improved subjectively. She's no longer having headaches. Her sedimentation rate had actually improved from 60 to 7  prior to increasing the prednisone dose to 40 mg daily which is her present dose. Dr. Emily Filbert  stated that 30 mg would be an adequate dose.    Review of Systems     Objective:   Physical Exam   She is thin but appears healthy and well-nourished. She appears younger than stated age.  She has no lymphadenopathy about the head or neck or axilla.  She has a classic click at the apex with occasional premature beats. Initially I was concerned about a dysrhythmia.        Assessment & Plan:  #1 temporal arteritis; excellent response to steroids. Control weaning the dose is appropriate to 30 mg over the next 4-6 weeks.  #2 click murmur with premature beats. Because of the prematures and the high-dose steroids, potassium will be checked.

## 2011-10-16 ENCOUNTER — Ambulatory Visit (INDEPENDENT_AMBULATORY_CARE_PROVIDER_SITE_OTHER): Payer: Medicare Other | Admitting: General Surgery

## 2011-10-16 ENCOUNTER — Ambulatory Visit (INDEPENDENT_AMBULATORY_CARE_PROVIDER_SITE_OTHER): Payer: Medicare Other

## 2011-10-16 ENCOUNTER — Encounter (INDEPENDENT_AMBULATORY_CARE_PROVIDER_SITE_OTHER): Payer: Self-pay | Admitting: General Surgery

## 2011-10-16 VITALS — BP 124/64 | HR 76 | Temp 97.3°F | Resp 12 | Ht 61.0 in | Wt 109.6 lb

## 2011-10-16 DIAGNOSIS — M316 Other giant cell arteritis: Secondary | ICD-10-CM

## 2011-10-16 DIAGNOSIS — Z2911 Encounter for prophylactic immunotherapy for respiratory syncytial virus (RSV): Secondary | ICD-10-CM

## 2011-10-16 DIAGNOSIS — Z23 Encounter for immunization: Secondary | ICD-10-CM

## 2011-10-16 NOTE — Progress Notes (Signed)
Subjective:     Patient ID: Danielle Mcgee, female   DOB: 04-Mar-1930, 77 y.o.   MRN: [id number]  HPI This patient underwent recent left temporal artery biopsy, and the final pathology report does confirm temporal arteritis. She has already begun on an intensive treatment plan with steroids under the guidance of Dr. Marga Melnick.  She has no complaints about her left face incision. Denies numbness or muscle weakness.  Review of Systems     Objective:   Physical Exam The patient looks well. Extremely pleasant. In no distress.  Left temporal incision is healing very nicely. No complications. No sensory deficit. All facial nerves appear to be working nicely.    Assessment:     Temporal arteritis, uneventful recovery following left temporal artery biopsy.    Plan:     Return to see me p.r.n.

## 2011-10-16 NOTE — Patient Instructions (Signed)
The scar on your left face where we did the temporal artery biopsy is healing very nicely. We gave you a copy of the pathology report today. I agree with the treatment plan that Dr. Alwyn Ren has outlined for you. Return to see me if any new problems arise.iii

## 2011-11-06 ENCOUNTER — Telehealth: Payer: Self-pay

## 2011-11-06 NOTE — Telephone Encounter (Signed)
msg from patient she had concerns about her labs.    I tried calling patient but her line was busy.      KP

## 2011-11-08 ENCOUNTER — Other Ambulatory Visit (INDEPENDENT_AMBULATORY_CARE_PROVIDER_SITE_OTHER): Payer: Medicare Other

## 2011-11-08 DIAGNOSIS — M316 Other giant cell arteritis: Secondary | ICD-10-CM | POA: Diagnosis not present

## 2011-11-08 DIAGNOSIS — T887XXA Unspecified adverse effect of drug or medicament, initial encounter: Secondary | ICD-10-CM | POA: Diagnosis not present

## 2011-11-08 NOTE — Telephone Encounter (Signed)
Spoke with patient, patient needed to schedule a lab appointment then a follow-up with Dr.Hopper, appointment's scheduled

## 2011-11-09 LAB — BASIC METABOLIC PANEL
BUN: 19 mg/dL (ref 6–23)
CO2: 28 mEq/L (ref 19–32)
Calcium: 9.5 mg/dL (ref 8.4–10.5)
Chloride: 105 mEq/L (ref 96–112)
Creatinine, Ser: 0.8 mg/dL (ref 0.4–1.2)
GFR: 70.43 mL/min (ref >60.00)
Glucose, Bld: 125 mg/dL — ABNORMAL HIGH (ref 70–99)
Potassium: 4.4 mEq/L (ref 3.5–5.1)
Sodium: 141 mEq/L (ref 135–145)

## 2011-11-09 LAB — SEDIMENTATION RATE: Sed Rate: 10 mm/hr (ref 0–22)

## 2011-11-10 ENCOUNTER — Ambulatory Visit: Payer: Medicare Other | Admitting: Neurology

## 2011-11-13 ENCOUNTER — Encounter: Payer: Self-pay | Admitting: Internal Medicine

## 2011-11-13 ENCOUNTER — Ambulatory Visit (INDEPENDENT_AMBULATORY_CARE_PROVIDER_SITE_OTHER): Payer: Medicare Other | Admitting: Internal Medicine

## 2011-11-13 DIAGNOSIS — J45909 Unspecified asthma, uncomplicated: Secondary | ICD-10-CM

## 2011-11-13 DIAGNOSIS — R21 Rash and other nonspecific skin eruption: Secondary | ICD-10-CM

## 2011-11-13 DIAGNOSIS — M316 Other giant cell arteritis: Secondary | ICD-10-CM

## 2011-11-13 MED ORDER — MOMETASONE FUROATE 0.1 % EX OINT: TOPICAL_OINTMENT | Freq: Two times a day (BID) | CUTANEOUS | Status: DC

## 2011-11-13 MED ORDER — PREDNISONE 20 MG PO TABS: 20.0000 mg | ORAL_TABLET | Freq: Two times a day (BID) | ORAL | Status: DC

## 2011-11-13 MED ORDER — BECLOMETHASONE DIPROPIONATE 80 MCG/ACT IN AERS: 1.0000 | INHALATION_SPRAY | Freq: Two times a day (BID) | RESPIRATORY_TRACT | Status: DC

## 2011-11-13 NOTE — Assessment & Plan Note (Addendum)
As visual symptoms have resolved and sed rate has not increased significantly; the dose of prednisone will be reduced by 2.5 mg per day at monthly intervals. The steroid  dose will be continued @ 30 mg then  decreased to 27.5 mg for one month and a sedimentation rate checked in 7 weeks. Ex of wean over  Initially 4 until dose @ 20 mg daily  then every 8 weeks  : 30; 27.5, 25, 22.5, 20, 17.5,15,12.5, 10, 7.5 , 5 , 2.5 then off based on visual symptoms & sed rate

## 2011-11-13 NOTE — Progress Notes (Signed)
  Subjective:    Patient ID: Danielle Mcgee, female    DOB: 12-28-1929, 78 y.o.   MRN: [id number]  HPI   Her ophthalmologist states that the visual issues are stable and the steroids can be weaned. She is now on 30 mg daily ( X 5 days) but has adverse effects of "being tense" and sensation that she is having tremor even though such is not visualized.                                                                                                                                                                                           Review of Systems     Objective:   Physical Exam  she is thin but healthy and well-nourished in appearance  No conjunctivitis is present; extraocular motion is intact; vision with lenses is normal.  Heart rhythm is regular; she is no carotid artery bruits.  There's no tenderness to palpation over the temples.  Skin is clear without worrisome lesions or rashes.  She has no lymphadenopathy about the neck or axilla     Assessment & Plan:

## 2011-11-13 NOTE — Patient Instructions (Addendum)
Please  schedule fasting Labs : BMET,sed rate in 7 weeks. PLEASE BRING THESE INSTRUCTIONS TO FOLLOW UP  LAB APPOINTMENT.This will guarantee correct labs are drawn, eliminating need for repeat blood sampling ( needle sticks ! ). Diagnoses /Codes: temporal arteritis

## 2011-11-16 ENCOUNTER — Telehealth: Payer: Self-pay | Admitting: *Deleted

## 2011-11-16 NOTE — Telephone Encounter (Signed)
Pt states she was in office on 01.07.13 and when she got home and looked at her AVS that there are a lot of errors on her active medication list. Request call back to correct the errors & update med list.

## 2011-11-16 NOTE — Telephone Encounter (Signed)
2 Meds: Elicon cream and Qvar was on med list and patient is not taking and never took. Med list updated

## 2011-11-29 DIAGNOSIS — H35049 Retinal micro-aneurysms, unspecified, unspecified eye: Secondary | ICD-10-CM | POA: Diagnosis not present

## 2011-12-15 ENCOUNTER — Other Ambulatory Visit: Payer: Self-pay | Admitting: Internal Medicine

## 2011-12-15 NOTE — Telephone Encounter (Signed)
OK X1 

## 2011-12-15 NOTE — Telephone Encounter (Signed)
Dr.Hopper please advise if ok to refill prednisone and if ok to override warnings when filling med

## 2011-12-22 ENCOUNTER — Other Ambulatory Visit: Payer: Self-pay | Admitting: Internal Medicine

## 2011-12-22 DIAGNOSIS — M316 Other giant cell arteritis: Secondary | ICD-10-CM

## 2011-12-25 ENCOUNTER — Other Ambulatory Visit (INDEPENDENT_AMBULATORY_CARE_PROVIDER_SITE_OTHER): Payer: Medicare Other

## 2011-12-25 DIAGNOSIS — M316 Other giant cell arteritis: Secondary | ICD-10-CM

## 2011-12-25 LAB — BASIC METABOLIC PANEL
BUN: 17 mg/dL (ref 6–23)
CO2: 31 mEq/L (ref 19–32)
Calcium: 9.3 mg/dL (ref 8.4–10.5)
Chloride: 107 mEq/L (ref 96–112)
Creatinine, Ser: 0.8 mg/dL (ref 0.4–1.2)
GFR: 72.41 mL/min (ref >60.00)
Glucose, Bld: 90 mg/dL (ref 70–99)
Potassium: 4.5 mEq/L (ref 3.5–5.1)
Sodium: 143 mEq/L (ref 135–145)

## 2011-12-29 ENCOUNTER — Encounter: Payer: Self-pay | Admitting: Internal Medicine

## 2011-12-29 ENCOUNTER — Ambulatory Visit (INDEPENDENT_AMBULATORY_CARE_PROVIDER_SITE_OTHER): Payer: Medicare Other | Admitting: Internal Medicine

## 2011-12-29 DIAGNOSIS — M316 Other giant cell arteritis: Secondary | ICD-10-CM | POA: Diagnosis not present

## 2011-12-29 NOTE — Assessment & Plan Note (Signed)
Unfortunately it appears the sedimentation rate was drawn but not processed. Clinically there is no evidence of active polymyalgia rheumatica by history or physical. She'll continue  25 mg of prednisone daily and check the sedimentation rate in one month. We will continue to drop the prednisone by 2.5 mg daily each until she is at 20 mg. At that point reductions will be made every 8 weeks.  Despite family history diabetes and the fasting hyperglycemia last month; her fasting blood sugars is now normal.

## 2011-12-29 NOTE — Progress Notes (Signed)
  Subjective:    Patient ID: Danielle Mcgee, female    DOB: Nov 29, 1929, 78 y.o.   MRN: [id number]  HPI She has had no recurrence of her visual symptoms; Dr Emily Filbert is monitoring vascular health and intraocular pressures.  She denies any muscle pain or pain, swelling, or redness in her joints.  She is able to describe the weaning program for her prednisone. Her sedimentation rate originally was 60; sed rate was 7 one month ago. The lab requisition states the test was collected 2/18 but no results are available.  BMET reveals that the glucose is now normal at 90; it was 125 one month ago.  Because of her underlying osteopenia and the need for steroids over a prolonged period; a bone density will be repeated annually to rule out significant progression of osteopenia.      Review of Systems  She denies polydipsia, polyphagia, polyuria. There is a strong family history of diabetes in her family but it is in the context of obesity     Objective:   Physical Exam She is thin but  healthy in appearance; she appears younger than  stated age  She has no lymphadenopathy about the neck or axilla.  There is no tenderness to palpation over the temples, neck or upper back.  Neck is supple with good range of motion  Joints are normal without effusion or erythema. There is minimal crepitus of the shoulders which is insignificant. She has straight leg raising to 90 plus. She is able to lie back & sit up  without help.        Assessment & Plan:

## 2011-12-29 NOTE — Patient Instructions (Signed)
PLEASE BRING THESE INSTRUCTIONS TO FOLLOW UP  LAB APPOINTMENT in ONE month for SED RATE.This will guarantee correct labs are drawn, eliminating need for repeat blood sampling ( needle sticks ! ). Diagnoses /Codes: PMR

## 2012-01-02 ENCOUNTER — Other Ambulatory Visit: Payer: Medicare Other

## 2012-01-02 LAB — SEDIMENTATION RATE: Sed Rate: 11 mm/hr (ref 0–22)

## 2012-01-08 DIAGNOSIS — H35049 Retinal micro-aneurysms, unspecified, unspecified eye: Secondary | ICD-10-CM | POA: Diagnosis not present

## 2012-01-09 ENCOUNTER — Other Ambulatory Visit: Payer: Self-pay | Admitting: Obstetrics & Gynecology

## 2012-01-09 DIAGNOSIS — Z1231 Encounter for screening mammogram for malignant neoplasm of breast: Secondary | ICD-10-CM

## 2012-01-09 DIAGNOSIS — M25539 Pain in unspecified wrist: Secondary | ICD-10-CM | POA: Diagnosis not present

## 2012-01-15 ENCOUNTER — Other Ambulatory Visit: Payer: Self-pay | Admitting: Internal Medicine

## 2012-01-15 NOTE — Telephone Encounter (Signed)
#  30 as directed

## 2012-01-15 NOTE — Telephone Encounter (Signed)
Okay to refill? Last fill 12/15/11

## 2012-01-16 ENCOUNTER — Other Ambulatory Visit: Payer: Self-pay | Admitting: Internal Medicine

## 2012-01-16 NOTE — Telephone Encounter (Signed)
#   30 as directed(she is on a weaning schedule)

## 2012-01-16 NOTE — Telephone Encounter (Signed)
Dr.Hopper please advise 

## 2012-01-17 NOTE — Telephone Encounter (Signed)
Prescription sent to pharmacy.

## 2012-01-19 DIAGNOSIS — R29898 Other symptoms and signs involving the musculoskeletal system: Secondary | ICD-10-CM | POA: Diagnosis not present

## 2012-01-19 DIAGNOSIS — T8489XA Other specified complication of internal orthopedic prosthetic devices, implants and grafts, initial encounter: Secondary | ICD-10-CM | POA: Diagnosis not present

## 2012-01-19 DIAGNOSIS — Z472 Encounter for removal of internal fixation device: Secondary | ICD-10-CM | POA: Diagnosis not present

## 2012-01-19 DIAGNOSIS — M7020 Olecranon bursitis, unspecified elbow: Secondary | ICD-10-CM | POA: Diagnosis not present

## 2012-01-29 ENCOUNTER — Other Ambulatory Visit (INDEPENDENT_AMBULATORY_CARE_PROVIDER_SITE_OTHER): Payer: Medicare Other

## 2012-01-29 DIAGNOSIS — M353 Polymyalgia rheumatica: Secondary | ICD-10-CM

## 2012-01-29 LAB — SEDIMENTATION RATE: Sed Rate: 9 mm/hr (ref 0–22)

## 2012-01-31 ENCOUNTER — Telehealth: Payer: Self-pay | Admitting: Internal Medicine

## 2012-01-31 ENCOUNTER — Encounter: Payer: Self-pay | Admitting: Internal Medicine

## 2012-01-31 ENCOUNTER — Ambulatory Visit (INDEPENDENT_AMBULATORY_CARE_PROVIDER_SITE_OTHER): Payer: Medicare Other | Admitting: Internal Medicine

## 2012-01-31 VITALS — BP 118/70 | HR 72 | Wt 110.2 lb

## 2012-01-31 DIAGNOSIS — M316 Other giant cell arteritis: Secondary | ICD-10-CM | POA: Diagnosis not present

## 2012-01-31 MED ORDER — LORAZEPAM 0.5 MG PO TABS: ORAL_TABLET | ORAL | Status: DC

## 2012-01-31 NOTE — Assessment & Plan Note (Signed)
She has been on prednisone 22.5 mg for one week. This dose will be continued for 3 more weeks. At that time she'll be reduced to 20 mg daily. Sedimentation rate will be checked after 4 weeks of this status.

## 2012-01-31 NOTE — Telephone Encounter (Signed)
Refill: Lorazepam 0.5mg  tablet. Take 1 tablet every 8 to 12 hours as needed.

## 2012-01-31 NOTE — Progress Notes (Signed)
  Subjective:    Patient ID: Danielle Mcgee, female    DOB: Oct 28, 1930, 78 y.o.   MRN: [id number]  HPI Since her last visit she had a single episode of headache. This was brief and resolved without treatment. She saw Dr. Emily Filbert who found no ophthalmologic abnormalities.  Because of this episode she returned for a sed rate which was 11 on  3/11 & 9 on 3/25.  Unfortunately she has misplaced her weaning schedule for the steroids.    Review of Systems  She denies fever, chills, sweats, blurred vision, double vision, or loss of vision. She's had no unexplained weight loss and no rash     Objective:   Physical Exam General appearance :thin;  good health and nourishment w/o distress. Appears younger than stated age   Eyes: No conjunctival inflammation or scleral icterus is present.EOMI & vision intact with lenses    Heart:  Normal rate and regular rhythm. S1 and S2 normal without gallop, murmur, click, rub or other extra sounds     Lungs:Chest clear to auscultation; no wheezes, rhonchi,rales ,or rubs present.No increased work of breathing.     Skin:Warm & dry.  Intact without suspicious lesions or rashes ; no jaundice or tenting. No temporal tenderness  Lymphatic: No lymphadenopathy is noted about the head, neck, axilla areas.             Assessment & Plan:

## 2012-01-31 NOTE — Patient Instructions (Signed)
PLEASE BRING THESE INSTRUCTIONS TO FOLLOW UP  LAB APPOINTMENT for sed rate after 4 weeks of 20 mg daily.This will guarantee correct labs are drawn, eliminating need for repeat blood sampling ( needle sticks ! ). Diagnoses /Codes: Temporal Arteritis

## 2012-01-31 NOTE — Telephone Encounter (Signed)
RX sent

## 2012-02-01 DIAGNOSIS — R29898 Other symptoms and signs involving the musculoskeletal system: Secondary | ICD-10-CM | POA: Diagnosis not present

## 2012-02-07 ENCOUNTER — Ambulatory Visit
Admission: RE | Admit: 2012-02-07 | Discharge: 2012-02-07 | Disposition: A | Discharge status: Home / Self Care | Payer: Medicare Other | Source: Ambulatory Visit | Attending: Obstetrics & Gynecology | Admitting: Obstetrics & Gynecology

## 2012-02-07 DIAGNOSIS — Z1231 Encounter for screening mammogram for malignant neoplasm of breast: Secondary | ICD-10-CM

## 2012-03-19 ENCOUNTER — Other Ambulatory Visit: Payer: Self-pay | Admitting: Internal Medicine

## 2012-03-19 NOTE — Telephone Encounter (Signed)
SED RATE

## 2012-03-20 ENCOUNTER — Other Ambulatory Visit: Payer: Medicare Other

## 2012-03-25 DIAGNOSIS — H35049 Retinal micro-aneurysms, unspecified, unspecified eye: Secondary | ICD-10-CM | POA: Diagnosis not present

## 2012-03-28 ENCOUNTER — Other Ambulatory Visit (INDEPENDENT_AMBULATORY_CARE_PROVIDER_SITE_OTHER): Payer: Medicare Other

## 2012-03-28 DIAGNOSIS — Z124 Encounter for screening for malignant neoplasm of cervix: Secondary | ICD-10-CM | POA: Diagnosis not present

## 2012-03-28 DIAGNOSIS — Z1151 Encounter for screening for human papillomavirus (HPV): Secondary | ICD-10-CM | POA: Diagnosis not present

## 2012-03-28 DIAGNOSIS — E785 Hyperlipidemia, unspecified: Secondary | ICD-10-CM

## 2012-03-28 DIAGNOSIS — Z01419 Encounter for gynecological examination (general) (routine) without abnormal findings: Secondary | ICD-10-CM | POA: Diagnosis not present

## 2012-03-29 LAB — SEDIMENTATION RATE: Sed Rate: 7 mm/hr (ref 0–22)

## 2012-04-02 ENCOUNTER — Ambulatory Visit (INDEPENDENT_AMBULATORY_CARE_PROVIDER_SITE_OTHER): Payer: Medicare Other | Admitting: Internal Medicine

## 2012-04-02 ENCOUNTER — Encounter: Payer: Self-pay | Admitting: Internal Medicine

## 2012-04-02 VITALS — BP 116/68 | HR 83 | Temp 97.8°F | Wt 109.0 lb

## 2012-04-02 DIAGNOSIS — M316 Other giant cell arteritis: Secondary | ICD-10-CM

## 2012-04-02 MED ORDER — PREDNISONE 5 MG PO TABS: ORAL_TABLET | ORAL | Status: DC

## 2012-04-02 NOTE — Assessment & Plan Note (Signed)
Prednisone will be decreased to 15 mg daily with repeat sedimentation rate in 6-8 weeks. The weaning  will continue at 2.5-5 mg every 8 weeks if the sedimentation rate remains at this low level and she has no significant musculoskeletal or ophthalmologic symptoms

## 2012-04-02 NOTE — Progress Notes (Signed)
  Subjective:    Patient ID: Danielle Mcgee, female    DOB: 1930-04-26, 78 y.o.   MRN: [id number]  HPI Her ophthalmologist states she has no active issues. She confirms that she has no blurred vision, double vision, loss of vision, or discharge eyes. She has been on prednisone 20 mg for approximately 6 weeks. She is noticing increasing bruising with tissue fragility    Review of Systems  She has no constitutional symptoms of fever, chills, sweats, or unexplained weight loss. Other than the bruising there has been no change in her skin; specifically she's had no rash or  She is not having significant joint pain, swelling, redness, or extremity weakness. Most probably she is not having headache, neck pain or shoulder girdle discomfort     Objective:   Physical Exam she is thin but appears well-nourished; she does not exhibit cushingoid stigmata  Wall chart is read with lenses without difficulty  She has full range of motion of all extremities and neck.  Strength and tone are normal.  She has scattered scarring and bruising on the forearms  She has no lymphadenopathy about the neck or axilla        Assessment & Plan:

## 2012-04-02 NOTE — Patient Instructions (Signed)
Please schedule follow up LAB  appointment in 6-8  Weeks.PLEASE BRING THESE INSTRUCTIONS TO FOLLOW UP  LAB APPOINTMENT.This will guarantee correct labs are drawn, eliminating need for repeat blood sampling ( needle sticks ! ). Diagnoses /Codes: Temporal Arteritis

## 2012-04-12 ENCOUNTER — Other Ambulatory Visit: Payer: Medicare Other

## 2012-04-16 ENCOUNTER — Ambulatory Visit: Payer: Medicare Other | Admitting: Internal Medicine

## 2012-05-13 ENCOUNTER — Other Ambulatory Visit: Payer: Medicare Other

## 2012-05-17 ENCOUNTER — Ambulatory Visit: Payer: Medicare Other | Admitting: Internal Medicine

## 2012-05-27 ENCOUNTER — Other Ambulatory Visit (INDEPENDENT_AMBULATORY_CARE_PROVIDER_SITE_OTHER): Payer: Medicare Other

## 2012-05-27 ENCOUNTER — Ambulatory Visit: Payer: Medicare Other | Admitting: Internal Medicine

## 2012-05-27 DIAGNOSIS — M353 Polymyalgia rheumatica: Secondary | ICD-10-CM

## 2012-05-27 DIAGNOSIS — M316 Other giant cell arteritis: Secondary | ICD-10-CM

## 2012-05-27 LAB — SEDIMENTATION RATE: Sed Rate: 10 mm/hr (ref 0–22)

## 2012-05-27 NOTE — Progress Notes (Signed)
Labs only

## 2012-05-31 ENCOUNTER — Encounter: Payer: Self-pay | Admitting: Internal Medicine

## 2012-05-31 ENCOUNTER — Ambulatory Visit (INDEPENDENT_AMBULATORY_CARE_PROVIDER_SITE_OTHER): Payer: Medicare Other | Admitting: Internal Medicine

## 2012-05-31 VITALS — BP 114/60 | HR 88 | Wt 110.2 lb

## 2012-05-31 DIAGNOSIS — M316 Other giant cell arteritis: Secondary | ICD-10-CM | POA: Diagnosis not present

## 2012-05-31 MED ORDER — PREDNISONE 5 MG PO TABS: ORAL_TABLET | ORAL | Status: DC

## 2012-05-31 NOTE — Progress Notes (Signed)
  Subjective:    Patient ID: Danielle Mcgee, female    DOB: [birth year], 78 y.o.   MRN: [id number]  HPI She is presently on prednisone 15 mg daily and is asymptomatic in relationship to her temporal arteritis. Specifically she has no visual symptoms, arthralgias, or fever.  She describes tissue fragility; she noticed a recent skin tear without remembered trauma. She's also having bruising of the forearms.  Her fasting glucose was 125 in January on higher dose steroids  Based on literature she should be on prednisone for a total of 18- 24 months. This would extend until 09/06/2014. As she is asymptomatic and the sedimentation rate is been controlled; weaning of 2.5 mg every 8-12 weeks and monitoring for any exacerbation to be appropriate.  Specifically over the last 6 months her sedimentation rate has ranged from 7-11 even though the prednisone has been weaned from a high of 40 mg daily in late October.   Review of Systems She had a scleral vessel burst in the left eye a week ago. She specifically denies blurred vision, double vision, or loss of vision  She is not having headaches.  There is  no joint swelling, stiffness, redness or decreased range of motion  She denies polyuria, polyphagia, polydipsia     Objective:   Physical Exam She is thin but appears well-nourished  Do not appreciate any scleral bleeding or jaundice  Cervical range of motion is normal  She has no lymphadenopathy about the neck or axilla  Organomegaly or masses  Musculoskeletal exam is unremarkable. Range of motion is excellent. She has mild crepitus of the left knee without effusion  There is a small, well-healed scar over left upper extremity. She has scattered small ecchymoses on the upper extremities. There is no tenderness to palpation over the temples        Assessment & Plan:  #1 temporal arteritis; clinically quiescent  #2 tissue fragility and easy bruising related to steroid use  Plan: The  prednisone will be decreased by 2.5 mg every 8-12 weeks. Hopefully the steroids can be discontinued at 18 months rather than 24. The latter would be 11 /1/15

## 2012-05-31 NOTE — Patient Instructions (Addendum)
Please  schedule fasting Labs in 8- 12 weeks : BMET,sed rate. PLEASE BRING THESE INSTRUCTIONS TO FOLLOW UP  LAB APPOINTMENT.This will guarantee correct labs are drawn, eliminating need for repeat blood sampling ( needle sticks ! ). Diagnoses /Codes: 790.29,995.20, 446.5

## 2012-06-04 DIAGNOSIS — M25539 Pain in unspecified wrist: Secondary | ICD-10-CM | POA: Diagnosis not present

## 2012-06-17 ENCOUNTER — Encounter: Payer: Self-pay | Admitting: Internal Medicine

## 2012-06-17 ENCOUNTER — Ambulatory Visit (INDEPENDENT_AMBULATORY_CARE_PROVIDER_SITE_OTHER): Payer: Medicare Other | Admitting: Internal Medicine

## 2012-06-17 VITALS — BP 120/68 | HR 99 | Temp 97.6°F | Wt 109.6 lb

## 2012-06-17 DIAGNOSIS — M316 Other giant cell arteritis: Secondary | ICD-10-CM

## 2012-06-17 DIAGNOSIS — M25539 Pain in unspecified wrist: Secondary | ICD-10-CM | POA: Diagnosis not present

## 2012-06-17 DIAGNOSIS — R51 Headache: Secondary | ICD-10-CM | POA: Diagnosis not present

## 2012-06-17 NOTE — Progress Notes (Signed)
  Subjective:    Patient ID: Danielle Mcgee, female    DOB: [birth year], 78 y.o.   MRN: [id number]  HPI Within the last week she's had generalized , low grade ( "less than 1" ) headache. Because of her history of temporal arteritis she palpated the  temples and noted some slight tenderness.  Her prednisone dose was decreased to 12.5 mg several weeks ago.  Significantly she ruptured a tendon in the left thumb which will require surgery. This occurred simply pulling on her husband's support does    Review of Systems She specifically denies blurred vision, double vision, or loss of vision. She's had no fever, chills, sweats, or weight loss.     Objective:   Physical Exam She is thin but appears healthy and well-nourished. She appears younger than stated age  No cranial nerve deficit is suggested  There is minor localized tenderness at the left posterior temporal. There no associated skin changes.  Extraocular motion is intact as is field of vision. Vision is normal with lenses  She has no lymphadenopathy about the neck or axilla  There is decreased flexion of the left thumb         Assessment & Plan:  #1 minor headache in the context of history of temporal arteritis; clinically there's been no flare  Plan: Recheck sedimentation rate

## 2012-06-17 NOTE — Patient Instructions (Addendum)
Please keep a diary of your headaches . Document  each occurrence on the calendar with notation of : #1) severity on 1-10 scale; #2) any triggers ( food/ drink,enviromenntal or weather changes ,physical or emotional stress) in 8-12 hour period prior to the headache; & #3) response to any medications or other intervention. Please review "Headache" @ [facility] for additional information.    Consider glucosamine sulfate 1500 mg daily for joint symptoms. Take this daily  for 3 months and then leave it off for 2 months. This will rehydrate the cartilages.

## 2012-06-18 ENCOUNTER — Encounter (HOSPITAL_BASED_OUTPATIENT_CLINIC_OR_DEPARTMENT_OTHER): Payer: Self-pay | Admitting: *Deleted

## 2012-06-18 LAB — SEDIMENTATION RATE: Sed Rate: 9 mm/hr (ref 0–22)

## 2012-06-18 NOTE — Progress Notes (Signed)
To come in for [id number]  

## 2012-06-19 ENCOUNTER — Encounter (HOSPITAL_BASED_OUTPATIENT_CLINIC_OR_DEPARTMENT_OTHER)
Admission: RE | Admit: 2012-06-19 | Discharge: 2012-06-19 | Disposition: A | Discharge status: Home / Self Care | Payer: Medicare Other | Source: Ambulatory Visit | Attending: Orthopedic Surgery | Admitting: Orthopedic Surgery

## 2012-06-19 DIAGNOSIS — Z01810 Encounter for preprocedural cardiovascular examination: Secondary | ICD-10-CM | POA: Diagnosis not present

## 2012-06-19 DIAGNOSIS — M66239 Spontaneous rupture of extensor tendons, unspecified forearm: Secondary | ICD-10-CM | POA: Diagnosis not present

## 2012-06-19 DIAGNOSIS — Z01812 Encounter for preprocedural laboratory examination: Secondary | ICD-10-CM | POA: Diagnosis not present

## 2012-06-19 LAB — BASIC METABOLIC PANEL
BUN: 15 mg/dL (ref 6–23)
CO2: 31 mEq/L (ref 19–32)
Calcium: 8.9 mg/dL (ref 8.4–10.5)
Chloride: 110 mEq/L (ref 96–112)
Creatinine, Ser: 0.78 mg/dL (ref 0.50–1.10)
GFR calc Af Amer: 90 mL/min — ABNORMAL LOW (ref >90)
GFR calc non Af Amer: 77 mL/min — ABNORMAL LOW (ref >90)
Glucose, Bld: 69 mg/dL — ABNORMAL LOW (ref 70–99)
Potassium: 4.7 mEq/L (ref 3.5–5.1)
Sodium: 147 mEq/L — ABNORMAL HIGH (ref 135–145)

## 2012-06-21 ENCOUNTER — Ambulatory Visit (HOSPITAL_BASED_OUTPATIENT_CLINIC_OR_DEPARTMENT_OTHER): Payer: Medicare Other | Admitting: Anesthesiology

## 2012-06-21 ENCOUNTER — Ambulatory Visit (HOSPITAL_BASED_OUTPATIENT_CLINIC_OR_DEPARTMENT_OTHER)
Admission: RE | Admit: 2012-06-21 | Discharge: 2012-06-21 | Disposition: A | Discharge status: Home / Self Care | Payer: Medicare Other | Source: Ambulatory Visit | Attending: Orthopedic Surgery | Admitting: Orthopedic Surgery

## 2012-06-21 ENCOUNTER — Encounter (HOSPITAL_BASED_OUTPATIENT_CLINIC_OR_DEPARTMENT_OTHER): Payer: Self-pay | Admitting: *Deleted

## 2012-06-21 ENCOUNTER — Encounter (HOSPITAL_BASED_OUTPATIENT_CLINIC_OR_DEPARTMENT_OTHER)
Admission: RE | Disposition: A | Discharge status: Home / Self Care | Payer: Self-pay | Source: Ambulatory Visit | Attending: Orthopedic Surgery

## 2012-06-21 ENCOUNTER — Encounter (HOSPITAL_BASED_OUTPATIENT_CLINIC_OR_DEPARTMENT_OTHER): Payer: Self-pay | Admitting: Anesthesiology

## 2012-06-21 DIAGNOSIS — Z01812 Encounter for preprocedural laboratory examination: Secondary | ICD-10-CM | POA: Diagnosis not present

## 2012-06-21 DIAGNOSIS — M65849 Other synovitis and tenosynovitis, unspecified hand: Secondary | ICD-10-CM | POA: Diagnosis not present

## 2012-06-21 DIAGNOSIS — M66239 Spontaneous rupture of extensor tendons, unspecified forearm: Secondary | ICD-10-CM | POA: Diagnosis not present

## 2012-06-21 DIAGNOSIS — M66249 Spontaneous rupture of extensor tendons, unspecified hand: Secondary | ICD-10-CM | POA: Insufficient documentation

## 2012-06-21 DIAGNOSIS — M65839 Other synovitis and tenosynovitis, unspecified forearm: Secondary | ICD-10-CM | POA: Diagnosis not present

## 2012-06-21 DIAGNOSIS — Z01810 Encounter for preprocedural cardiovascular examination: Secondary | ICD-10-CM | POA: Diagnosis not present

## 2012-06-21 HISTORY — DX: Other giant cell arteritis: M31.6

## 2012-06-21 LAB — POCT HEMOGLOBIN-HEMACUE: Hemoglobin: 14.8 g/dL (ref 12.0–15.0)

## 2012-06-21 SURGERY — TRANSFER, TENDON
Anesthesia: General | Site: Hand | Laterality: Left | Wound class: Clean

## 2012-06-21 MED ORDER — ONDANSETRON HCL 4 MG/2ML IJ SOLN
INTRAMUSCULAR | Status: DC | PRN
  Administered 2012-06-21: 4 mg via INTRAVENOUS

## 2012-06-21 MED ORDER — METOCLOPRAMIDE HCL 5 MG/ML IJ SOLN: 10.0000 mg | Freq: Once | INTRAMUSCULAR | Status: DC | PRN

## 2012-06-21 MED ORDER — OXYCODONE HCL 5 MG PO TABS: 5.0000 mg | ORAL_TABLET | Freq: Once | ORAL | Status: DC | PRN

## 2012-06-21 MED ORDER — TRAMADOL HCL 50 MG PO TABS: 50.0000 mg | ORAL_TABLET | ORAL | Status: AC | PRN

## 2012-06-21 MED ORDER — CEFAZOLIN SODIUM 1-5 GM-% IV SOLN
INTRAVENOUS | Status: DC | PRN
  Administered 2012-06-21: 1 g via INTRAVENOUS

## 2012-06-21 MED ORDER — HYDROMORPHONE HCL PF 1 MG/ML IJ SOLN
0.2500 mg | INTRAMUSCULAR | Status: DC | PRN
Start: 2012-06-21 — End: 2012-06-21

## 2012-06-21 MED ORDER — OXYCODONE HCL 5 MG/5ML PO SOLN: 5.0000 mg | Freq: Once | ORAL | Status: DC | PRN

## 2012-06-21 MED ORDER — DEXAMETHASONE SODIUM PHOSPHATE 4 MG/ML IJ SOLN
INTRAMUSCULAR | Status: DC | PRN
  Administered 2012-06-21: 10 mg via INTRAVENOUS

## 2012-06-21 MED ORDER — LIDOCAINE HCL (CARDIAC) 20 MG/ML IV SOLN
INTRAVENOUS | Status: DC | PRN
  Administered 2012-06-21: 60 mg via INTRAVENOUS

## 2012-06-21 MED ORDER — BUPIVACAINE HCL (PF) 0.5 % IJ SOLN
INTRAMUSCULAR | Status: DC | PRN
  Administered 2012-06-21: 6 mL

## 2012-06-21 MED ORDER — PROPOFOL 10 MG/ML IV EMUL
INTRAVENOUS | Status: DC | PRN
  Administered 2012-06-21: 150 mg via INTRAVENOUS

## 2012-06-21 MED ORDER — METOCLOPRAMIDE HCL 5 MG/ML IJ SOLN
INTRAMUSCULAR | Status: DC | PRN
  Administered 2012-06-21: 10 mg via INTRAVENOUS

## 2012-06-21 MED ORDER — LACTATED RINGERS IV SOLN
INTRAVENOUS | Status: DC
  Administered 2012-06-21 (×2): via INTRAVENOUS

## 2012-06-21 MED ORDER — FENTANYL CITRATE 0.05 MG/ML IJ SOLN
INTRAMUSCULAR | Status: DC | PRN
  Administered 2012-06-21 (×2): 25 ug via INTRAVENOUS
  Administered 2012-06-21: 50 ug via INTRAVENOUS

## 2012-06-21 MED ORDER — MIDAZOLAM HCL 5 MG/5ML IJ SOLN
INTRAMUSCULAR | Status: DC | PRN
  Administered 2012-06-21: 0.5 mg via INTRAVENOUS

## 2012-06-21 SURGICAL SUPPLY — 75 items
APL SKNCLS STERI-STRIP NONHPOA (GAUZE/BANDAGES/DRESSINGS)
BANDAGE ELASTIC 3 VELCRO ST LF (GAUZE/BANDAGES/DRESSINGS) ×4 IMPLANT
BENZOIN TINCTURE PRP APPL 2/3 (GAUZE/BANDAGES/DRESSINGS) IMPLANT
BLADE MINI RND TIP GREEN BEAV (BLADE) ×2 IMPLANT
BLADE SURG 15 STRL LF DISP TIS (BLADE) ×1 IMPLANT
BLADE SURG 15 STRL SS (BLADE) ×2
BNDG CMPR 9X4 STRL LF SNTH (GAUZE/BANDAGES/DRESSINGS) ×1
BNDG ESMARK 4X9 LF (GAUZE/BANDAGES/DRESSINGS) ×1 IMPLANT
CANISTER SUCTION 1200CC (MISCELLANEOUS) IMPLANT
CLOTH BEACON ORANGE TIMEOUT ST (SAFETY) ×2 IMPLANT
COVER MAYO STAND STRL (DRAPES) ×2 IMPLANT
COVER TABLE BACK 60X90 (DRAPES) ×2 IMPLANT
CUFF TOURNIQUET SINGLE 18IN (TOURNIQUET CUFF) ×2 IMPLANT
DECANTER SPIKE VIAL GLASS SM (MISCELLANEOUS) IMPLANT
DRAIN PENROSE 1/4X12 LTX STRL (WOUND CARE) IMPLANT
DRAPE EXTREMITY T 121X128X90 (DRAPE) ×2 IMPLANT
DRAPE OEC MINIVIEW 54X84 (DRAPES) IMPLANT
DRAPE SURG 17X23 STRL (DRAPES) ×2 IMPLANT
DRAPE U-SHAPE 47X51 STRL (DRAPES) IMPLANT
DURAPREP 26ML APPLICATOR (WOUND CARE) ×2 IMPLANT
ELECT NEEDLE TIP 2.8 STRL (NEEDLE) IMPLANT
ELECT REM PT RETURN 9FT ADLT (ELECTROSURGICAL)
ELECTRODE REM PT RTRN 9FT ADLT (ELECTROSURGICAL) IMPLANT
GAUZE XEROFORM 1X8 LF (GAUZE/BANDAGES/DRESSINGS) ×2 IMPLANT
GLOVE BIO SURGEON STRL SZ 6.5 (GLOVE) ×1 IMPLANT
GLOVE BIOGEL M STRL SZ7.5 (GLOVE) ×1 IMPLANT
GLOVE BIOGEL PI IND STRL 7.0 (GLOVE) IMPLANT
GLOVE BIOGEL PI IND STRL 8 (GLOVE) ×2 IMPLANT
GLOVE BIOGEL PI INDICATOR 7.0 (GLOVE) ×1
GLOVE BIOGEL PI INDICATOR 8 (GLOVE) ×2
GLOVE ECLIPSE 7.5 STRL STRAW (GLOVE) ×4 IMPLANT
GLOVE INDICATOR 7.0 STRL GRN (GLOVE) ×1 IMPLANT
GLOVE SS BIOGEL STRL SZ 6 (GLOVE) IMPLANT
GLOVE SUPERSENSE BIOGEL SZ 6 (GLOVE) ×1
GOWN BRE IMP PREV XXLGXLNG (GOWN DISPOSABLE) ×2 IMPLANT
GOWN PREVENTION PLUS XLARGE (GOWN DISPOSABLE) ×4 IMPLANT
GOWN PREVENTION PLUS XXLARGE (GOWN DISPOSABLE) ×2 IMPLANT
NDL HYPO 25X1 1.5 SAFETY (NEEDLE) IMPLANT
NDL KEITH (NEEDLE) IMPLANT
NEEDLE FISTULA 1/2 CIRCLE (NEEDLE) IMPLANT
NEEDLE HYPO 25X1 1.5 SAFETY (NEEDLE) IMPLANT
NEEDLE KEITH (NEEDLE) IMPLANT
NS IRRIG 1000ML POUR BTL (IV SOLUTION) ×2 IMPLANT
PACK BASIN DAY SURGERY FS (CUSTOM PROCEDURE TRAY) ×2 IMPLANT
PAD CAST 3X4 CTTN HI CHSV (CAST SUPPLIES) ×2 IMPLANT
PADDING CAST ABS 4INX4YD NS (CAST SUPPLIES) ×1
PADDING CAST ABS COTTON 4X4 ST (CAST SUPPLIES) ×1 IMPLANT
PADDING CAST COTTON 3X4 STRL (CAST SUPPLIES) ×4
PASSER SUT SWANSON 36MM LOOP (INSTRUMENTS) IMPLANT
PENCIL BUTTON HOLSTER BLD 10FT (ELECTRODE) IMPLANT
SHEET MEDIUM DRAPE 40X70 STRL (DRAPES) IMPLANT
SPLINT PLASTER CAST XFAST 3X15 (CAST SUPPLIES) IMPLANT
SPLINT PLASTER XTRA FASTSET 3X (CAST SUPPLIES)
SPONGE GAUZE 4X4 12PLY (GAUZE/BANDAGES/DRESSINGS) ×2 IMPLANT
STOCKINETTE 4X48 STRL (DRAPES) IMPLANT
STRIP CLOSURE SKIN 1/2X4 (GAUZE/BANDAGES/DRESSINGS) IMPLANT
SUCTION FRAZIER TIP 10 FR DISP (SUCTIONS) IMPLANT
SUT ETHIBOND 3-0 V-5 (SUTURE) IMPLANT
SUT ETHILON 4 0 PS 2 18 (SUTURE) ×1 IMPLANT
SUT FIBERWIRE 4-0 18 DIAM BLUE (SUTURE) ×4
SUT POLY BUTTON 15MM (SUTURE) IMPLANT
SUT PROLENE 3 0 PS 2 (SUTURE) IMPLANT
SUT VIC AB 2-0 SH 27 (SUTURE)
SUT VIC AB 2-0 SH 27XBRD (SUTURE) IMPLANT
SUT VIC AB 4-0 P-3 18XBRD (SUTURE) ×1 IMPLANT
SUT VIC AB 4-0 P3 18 (SUTURE) ×2
SUT VICRYL 4-0 PS2 18IN ABS (SUTURE) IMPLANT
SUTURE FIBERWR 4-0 18 DIA BLUE (SUTURE) IMPLANT
SYR BULB 3OZ (MISCELLANEOUS) ×2 IMPLANT
SYR CONTROL 10ML LL (SYRINGE) IMPLANT
TOWEL OR 17X24 6PK STRL BLUE (TOWEL DISPOSABLE) ×3 IMPLANT
TOWEL OR NON WOVEN STRL DISP B (DISPOSABLE) ×2 IMPLANT
TUBE CONNECTING 20X1/4 (TUBING) IMPLANT
UNDERPAD 30X30 INCONTINENT (UNDERPADS AND DIAPERS) ×2 IMPLANT
WATER STERILE IRR 1000ML POUR (IV SOLUTION) ×1 IMPLANT

## 2012-06-21 NOTE — Transfer of Care (Signed)
Immediate Anesthesia Transfer of Care Note  Patient: Danielle Mcgee  Procedure(s) Performed: Procedure(s) (LRB): TENDON TRANSFER (Left)  Patient Location: PACU  Anesthesia Type: General  Level of Consciousness: awake, alert  and oriented  Airway & Oxygen Therapy: Patient Spontanous Breathing and Patient connected to face mask oxygen  Post-op Assessment: Report given to PACU RN and Post -op Vital signs reviewed and stable  Post vital signs: Reviewed and stable  Complications: No apparent anesthesia complications

## 2012-06-21 NOTE — Anesthesia Procedure Notes (Signed)
Procedure Name: LMA Insertion Date/Time: 06/21/2012 12:34 PM Performed by: Norva Pavlov Pre-anesthesia Checklist: Patient identified, Emergency Drugs available, Suction available and Patient being monitored Patient Re-evaluated:Patient Re-evaluated prior to inductionOxygen Delivery Method: Circle System Utilized Preoxygenation: Pre-oxygenation with 100% oxygen Intubation Type: IV induction Ventilation: Mask ventilation without difficulty LMA: LMA inserted LMA Size: 3.0 Number of attempts: 1 Airway Equipment and Method: bite block Placement Confirmation: positive ETCO2 Tube secured with: Tape Dental Injury: Teeth and Oropharynx as per pre-operative assessment

## 2012-06-21 NOTE — H&P (Signed)
PREOPERATIVE H&P  Chief Complaint: inability to extend l thumb  HPI: Danielle Mcgee is a 78 y.o. female who presents for evaluation of inability to extend l thumb. It has been present for 6 weeks and has been worsening. She has failed conservative measures. Pain is rated as moderate.  Past Medical History  Diagnosis Date  . DJD (degenerative joint disease)   . Benign breast disease 2000  . Hyperlipidemia   . Mitral click-murmur syndrome     nomitral  reguritation  . Osteopenia   . Generalized headaches   . Temporal arteritis    Past Surgical History  Procedure Date  . Total knee arthroplasty     bilaterally  . Colonoscopy 2002 & 2013    Dr Juanda Chance  . Cataract extraction     bilaterally  . Appendectomy 1962  . Artery biopsy 09/20/2011    Procedure: BIOPSY TEMPORAL ARTERY;  Surgeon: [name], MD;  Location: [facility] OR;  Service: General;  Laterality: Left;  . Joint replacement 2001    both-knees  . Breast biopsy 2000    Atypical Lobular Hyperplasia  . Elbow surgery 2009    lt fx   History   Social History  . Marital Status: Married    Spouse Name: N/A    Number of Children: N/A  . Years of Education: N/A   Social History Main Topics  . Smoking status: Never Smoker   . Smokeless tobacco: Never Used  . Alcohol Use: Yes      occasionally in social setting  . Drug Use: No  . Sexually Active: None   Other Topics Concern  . None   Social History Narrative  . None   Family History  Problem Relation Age of Onset  . Cancer Mother     BREAST  . Heart disease Mother     MI  . Osteoporosis Mother   . Coronary artery disease Father   . Heart disease Sister     CABG  . Cancer Sister     CERVICAL   Allergies  Allergen Reactions  . Codeine     REACTION: GI PROBLEMS   Prior to Admission medications   Medication Sig Start Date End Date Taking? Authorizing Provider  aspirin 81 MG tablet Take 81 mg by mouth daily.     Yes Historical Provider, MD  Calcium  Carbonate-Vitamin D (CALCIUM + D PO) Take 1,200 mg by mouth daily.     Yes Historical Provider, MD  Cholecalciferol (VITAMIN D3) 2000 UNITS TABS Take by mouth.   Yes Historical Provider, MD  LORazepam (ATIVAN) 0.5 MG tablet Take 1 tablet every 8-12 hours as needed 01/31/12  Yes Pecola Lawless, MD  Multiple Vitamins-Minerals (CENTRUM SILVER PO) Take 1 tablet by mouth daily.     Yes Historical Provider, MD  predniSONE (DELTASONE) 5 MG tablet 2& 1/2 pills   (12.5 mg) daily until repeat sedimentation rate in 8-12 weeks 05/31/12  Yes Pecola Lawless, MD  raloxifene (EVISTA) 60 MG tablet Take 60 mg by mouth daily.    Yes Historical Provider, MD     Positive ROS: none  All other systems have been reviewed and were otherwise negative with the exception of those mentioned in the HPI and as above.  Physical Exam: Filed Vitals:   06/21/12 1045  BP: 150/72  Pulse: 76  Temp: 97.7 F (36.5 C)  Resp: 16    General: Alert, no acute distress Cardiovascular: No pedal edema Respiratory: No cyanosis, no  use of accessory musculature GI: No organomegaly, abdomen is soft and non-tender Skin: No lesions in the area of chief complaint Neurologic: Sensation intact distally Psychiatric: Patient is competent for consent with normal mood and affect Lymphatic: No axillary or cervical lymphadenopathy  MUSCULOSKELETAL: inability to extend l thumb// independent extension of index finger  Assessment/Plan: epl tendon rupture on left Plan for Procedure(s): TENDON TRANSFER  The risks benefits and alternatives were discussed with the patient including but not limited to the risks of nonoperative treatment, versus surgical intervention including infection, bleeding, nerve injury, malunion, nonunion, hardware prominence, hardware failure, need for hardware removal, blood clots, cardiopulmonary complications, morbidity, mortality, among others, and they were willing to proceed.  Predicted outcome is good, although  there will be at least a six to nine month expected recovery.  Sheyann Sulton L, MD 06/21/2012 10:56 AM

## 2012-06-21 NOTE — Anesthesia Postprocedure Evaluation (Signed)
Anesthesia Post Note  Patient: Danielle Mcgee  Procedure(s) Performed: Procedure(s) (LRB): TENDON TRANSFER (Left)  Anesthesia type: general  Patient location: PACU  Post pain: Pain level controlled  Post assessment: Patient's Cardiovascular Status Stable  Last Vitals:  Filed Vitals:   06/21/12 1515  BP: 135/57  Pulse: 76  Temp: 36.4 C  Resp: 16    Post vital signs: Reviewed and stable  Level of consciousness: sedated  Complications: No apparent anesthesia complications

## 2012-06-21 NOTE — Brief Op Note (Signed)
06/21/2012  3:27 PM  PATIENT:  Danielle Mcgee  78 y.o. female  PRE-OPERATIVE DIAGNOSIS:  epl tendon rupture on left  POST-OPERATIVE DIAGNOSIS:  epl tendon rupture on left  PROCEDURE:  Procedure(s) (LRB): TENDON TRANSFER (Left)  SURGEON:  Surgeon(s) and Role:    * Harvie Junior, MD - Primary  PHYSICIAN ASSISTANT:   ASSISTANTS: bethune   ANESTHESIA:   general  EBL:  Total I/O In: 1322 [P.O.:222; I.V.:1100] Out: -   BLOOD ADMINISTERED:none  DRAINS: none   LOCAL MEDICATIONS USED:  MARCAINE     SPECIMEN:  No Specimen  DISPOSITION OF SPECIMEN:  N/A  COUNTS:  YES  TOURNIQUET:   Total Tourniquet Time Documented: Upper Arm (Left) - 61 minutes  DICTATION: .Other Dictation: Dictation Number [id number]  PLAN OF CARE: Discharge to home after PACU  PATIENT DISPOSITION:  PACU - hemodynamically stable.   Delay start of Pharmacological VTE agent (>24hrs) due to surgical blood loss or risk of bleeding: not applicable

## 2012-06-21 NOTE — Anesthesia Preprocedure Evaluation (Signed)
Anesthesia Evaluation  Patient identified by MRN, date of birth, ID band Patient awake    Reviewed: Allergy & Precautions, H&P , NPO status , Patient's Chart, lab work & pertinent test results, reviewed documented beta blocker date and time   Airway Mallampati: II TM Distance: >3 FB Neck ROM: full    Dental   Pulmonary neg pulmonary ROS,  breath sounds clear to auscultation        Cardiovascular negative cardio ROS  Rhythm:regular     Neuro/Psych  Headaches, negative neurological ROS  negative psych ROS   GI/Hepatic negative GI ROS, Neg liver ROS,   Endo/Other  negative endocrine ROS  Renal/GU negative Renal ROS  negative genitourinary   Musculoskeletal   Abdominal   Peds  Hematology negative hematology ROS (+)   Anesthesia Other Findings See surgeon's H&P   Reproductive/Obstetrics negative OB ROS                           Anesthesia Physical Anesthesia Plan  ASA: II  Anesthesia Plan: General   Post-op Pain Management:    Induction: Intravenous  Airway Management Planned: LMA  Additional Equipment:   Intra-op Plan:   Post-operative Plan: Extubation in OR  Informed Consent: I have reviewed the patients History and Physical, chart, labs and discussed the procedure including the risks, benefits and alternatives for the proposed anesthesia with the patient or authorized representative who has indicated his/her understanding and acceptance.   Dental Advisory Given  Plan Discussed with: CRNA and Surgeon  Anesthesia Plan Comments:         Anesthesia Quick Evaluation

## 2012-06-24 ENCOUNTER — Other Ambulatory Visit: Payer: Self-pay | Admitting: Internal Medicine

## 2012-06-24 MED ORDER — LORAZEPAM 0.5 MG PO TABS: ORAL_TABLET | ORAL | Status: DC

## 2012-06-24 NOTE — Telephone Encounter (Signed)
Refill LORazepam (Tab) 0.5 MG Take 1 tablet every 8-12 hours as needed last fill 6.14.13 Last ov 8.12.13-meds mgmt

## 2012-06-24 NOTE — Op Note (Signed)
NAME:  SAPPHIRA, HARJO NO.:  [id number]  MEDICAL RECORD NO.:  [id number]  LOCATION:                                 FACILITY:  PHYSICIAN:  Harvie Junior, M.D.        DATE OF BIRTH:  DATE OF PROCEDURE:  06/21/2012 DATE OF DISCHARGE:                              OPERATIVE REPORT   PREOPERATIVE DIAGNOSIS:  Extensor pollicis longus spontaneous rupture.  POSTOPERATIVE DIAGNOSIS:  Extensor pollicis longus spontaneous rupture.  PROCEDURE: 1. Extensor indicis proprius transfer to the extensor pollicis longus 2. Tenotomy of extensor indices proprius. 3. Transfer of extensor indicis proprius to the extensor digitorum     communis to the index finger.  SURGEON:  Harvie Junior, M.D.  ASSISTANT:  Marshia Ly, P.A.  ANESTHESIA:  General.  BRIEF HISTORY:  Ms. Mcmichen is a 78 year old female with a spontaneous rupture of EPL on the left side.  We treated her conservatively for a period of time with a splint.  Really, none of these worked.  Because of inability to get the thumb extended, she was having difficulty with daily activity, and so we brought her for an EIP to EPL tendon transfer.  PROCEDURE:  The patient was brought to the operating room and after adequate anesthesia was obtained with general anesthetic, the patient was placed supine on the operating table.  The left arm was prepped and draped in sterile fashion.  Following this, the arm was exsanguinated and a blood pressure tourniquet inflated to 250 mmHg.  Following this, a small incision was made over the 1st knuckle, the EIP was identified ulnar of the 2 tendons and it was divided.  Once this was done, the remaining stump of the EIP was repaired to the extensor digitorum communis to the index finger with 2 figure-of-eight 4-0 FiberWire sutures.  Once this was done, a small incision was made just distal to the extensor retinaculum.  The [facility] and EIP tendons were identified.  The EIP tendon was then pulled  into the wound and it was then released distally.  Following this, a curved incision was made over the thumb metacarpal and the extensor pollicis longus tendon was identified, it was freshened up.  Once that was done, the wrist was held in neutral position, the thumb in hyperextension at the IP joint, and 2 tendons were then repaired with a Pulvertaft weave in both directions.  Once this was completed, the wounds were irrigated thoroughly and suctioned dry.  The skin was closed with 4-0 nylon interrupted sutures, a sterile compressive dressing was applied, and the patient was taken to recovery room where she was noted to be in satisfactory condition.  Estimated blood loss for this procedure was none.     Harvie Junior, M.D.     Ranae Plumber  D:  06/21/2012  T:  06/22/2012  Job:  [id number]

## 2012-06-24 NOTE — Telephone Encounter (Signed)
RX called in .

## 2012-06-25 ENCOUNTER — Encounter (HOSPITAL_BASED_OUTPATIENT_CLINIC_OR_DEPARTMENT_OTHER): Payer: Self-pay

## 2012-07-01 DIAGNOSIS — M65839 Other synovitis and tenosynovitis, unspecified forearm: Secondary | ICD-10-CM | POA: Diagnosis not present

## 2012-07-04 ENCOUNTER — Other Ambulatory Visit: Payer: Self-pay | Admitting: Dermatology

## 2012-07-04 DIAGNOSIS — M66239 Spontaneous rupture of extensor tendons, unspecified forearm: Secondary | ICD-10-CM | POA: Diagnosis not present

## 2012-07-04 DIAGNOSIS — C44621 Squamous cell carcinoma of skin of unspecified upper limb, including shoulder: Secondary | ICD-10-CM | POA: Diagnosis not present

## 2012-07-04 DIAGNOSIS — M66249 Spontaneous rupture of extensor tendons, unspecified hand: Secondary | ICD-10-CM | POA: Diagnosis not present

## 2012-07-04 DIAGNOSIS — M65839 Other synovitis and tenosynovitis, unspecified forearm: Secondary | ICD-10-CM | POA: Diagnosis not present

## 2012-07-04 DIAGNOSIS — M65849 Other synovitis and tenosynovitis, unspecified hand: Secondary | ICD-10-CM | POA: Diagnosis not present

## 2012-07-16 ENCOUNTER — Encounter: Payer: Self-pay | Admitting: Internal Medicine

## 2012-07-29 DIAGNOSIS — H35049 Retinal micro-aneurysms, unspecified, unspecified eye: Secondary | ICD-10-CM | POA: Diagnosis not present

## 2012-08-01 DIAGNOSIS — M65839 Other synovitis and tenosynovitis, unspecified forearm: Secondary | ICD-10-CM | POA: Diagnosis not present

## 2012-08-01 DIAGNOSIS — M66249 Spontaneous rupture of extensor tendons, unspecified hand: Secondary | ICD-10-CM | POA: Diagnosis not present

## 2012-08-01 DIAGNOSIS — M66239 Spontaneous rupture of extensor tendons, unspecified forearm: Secondary | ICD-10-CM | POA: Diagnosis not present

## 2012-08-01 DIAGNOSIS — M65849 Other synovitis and tenosynovitis, unspecified hand: Secondary | ICD-10-CM | POA: Diagnosis not present

## 2012-08-04 ENCOUNTER — Other Ambulatory Visit: Payer: Self-pay | Admitting: Internal Medicine

## 2012-08-05 NOTE — Telephone Encounter (Signed)
Per Dr.Hopper ok to fill  

## 2012-08-08 DIAGNOSIS — M66239 Spontaneous rupture of extensor tendons, unspecified forearm: Secondary | ICD-10-CM | POA: Diagnosis not present

## 2012-08-08 DIAGNOSIS — M65849 Other synovitis and tenosynovitis, unspecified hand: Secondary | ICD-10-CM | POA: Diagnosis not present

## 2012-08-08 DIAGNOSIS — M65839 Other synovitis and tenosynovitis, unspecified forearm: Secondary | ICD-10-CM | POA: Diagnosis not present

## 2012-08-08 DIAGNOSIS — M66249 Spontaneous rupture of extensor tendons, unspecified hand: Secondary | ICD-10-CM | POA: Diagnosis not present

## 2012-08-12 ENCOUNTER — Telehealth: Payer: Self-pay | Admitting: Internal Medicine

## 2012-08-12 DIAGNOSIS — R51 Headache: Secondary | ICD-10-CM

## 2012-08-12 DIAGNOSIS — M316 Other giant cell arteritis: Secondary | ICD-10-CM

## 2012-08-12 NOTE — Telephone Encounter (Signed)
Pt coming in wed 10.9.13, then wanted to see hopper 10.14.13 for follow up scheduled for 2pm

## 2012-08-12 NOTE — Telephone Encounter (Signed)
Called pt on home phone ABM LM to call and scheduled sed rate labs-smc

## 2012-08-12 NOTE — Telephone Encounter (Signed)
Please clarify if patient would like  Labs done here or [facility], I will place future order.

## 2012-08-12 NOTE — Telephone Encounter (Signed)
pt wants to have another sed rate test - last was 06/17/2012, pt stated she was told 7/26-to come back in 10-12 wks, and would like to have this test ran again  Cb# [phone]

## 2012-08-12 NOTE — Telephone Encounter (Signed)
Order Placed

## 2012-08-12 NOTE — Telephone Encounter (Signed)
She would like to come her per our earlier conversation, sorry should have noted that

## 2012-08-14 ENCOUNTER — Other Ambulatory Visit (INDEPENDENT_AMBULATORY_CARE_PROVIDER_SITE_OTHER): Payer: Medicare Other

## 2012-08-14 DIAGNOSIS — M316 Other giant cell arteritis: Secondary | ICD-10-CM | POA: Diagnosis not present

## 2012-08-14 LAB — SEDIMENTATION RATE: Sed Rate: 12 mm/hr (ref 0–22)

## 2012-08-15 DIAGNOSIS — M65839 Other synovitis and tenosynovitis, unspecified forearm: Secondary | ICD-10-CM | POA: Diagnosis not present

## 2012-08-15 DIAGNOSIS — M66239 Spontaneous rupture of extensor tendons, unspecified forearm: Secondary | ICD-10-CM | POA: Diagnosis not present

## 2012-08-15 DIAGNOSIS — M65849 Other synovitis and tenosynovitis, unspecified hand: Secondary | ICD-10-CM | POA: Diagnosis not present

## 2012-08-15 DIAGNOSIS — M66249 Spontaneous rupture of extensor tendons, unspecified hand: Secondary | ICD-10-CM | POA: Diagnosis not present

## 2012-08-19 ENCOUNTER — Encounter: Payer: Self-pay | Admitting: Internal Medicine

## 2012-08-19 ENCOUNTER — Ambulatory Visit (INDEPENDENT_AMBULATORY_CARE_PROVIDER_SITE_OTHER): Payer: Medicare Other | Admitting: Internal Medicine

## 2012-08-19 VITALS — BP 118/70 | HR 92 | Wt 113.2 lb

## 2012-08-19 DIAGNOSIS — M316 Other giant cell arteritis: Secondary | ICD-10-CM

## 2012-08-19 DIAGNOSIS — M66239 Spontaneous rupture of extensor tendons, unspecified forearm: Secondary | ICD-10-CM | POA: Diagnosis not present

## 2012-08-19 DIAGNOSIS — M65839 Other synovitis and tenosynovitis, unspecified forearm: Secondary | ICD-10-CM | POA: Diagnosis not present

## 2012-08-19 DIAGNOSIS — M65849 Other synovitis and tenosynovitis, unspecified hand: Secondary | ICD-10-CM | POA: Diagnosis not present

## 2012-08-19 DIAGNOSIS — M66249 Spontaneous rupture of extensor tendons, unspecified hand: Secondary | ICD-10-CM | POA: Diagnosis not present

## 2012-08-19 DIAGNOSIS — Z23 Encounter for immunization: Secondary | ICD-10-CM | POA: Diagnosis not present

## 2012-08-19 NOTE — Patient Instructions (Addendum)
Sed rate in 8 weeks

## 2012-08-19 NOTE — Assessment & Plan Note (Addendum)
Clinically she is very stable; a sedimentation rate has not changed significantly. I would recommend decreasing the prednisone to 10 mg and then recheck the sedimentation rate in 8 weeks. If she remains stable; this decrease could be conducted every 8 weeks until she is completely off the steroids, hopefully by may 2014

## 2012-08-19 NOTE — Progress Notes (Signed)
  Subjective:    Patient ID: Danielle Mcgee, female    DOB: 12-26-1929, 78 y.o.   MRN: [id number]  HPI   She recently saw Dr. Emily Filbert; she has no significant visual symptoms at this time. Also she has no headache. She has been  on 12.5 mg of prednisone daily; she's been on this dose since early July. Sedimentation rate has risen minimally from a value of 9-12. At that time she did have upper respiratory tract symptoms which may have impacted the sedimentation rate      Review of Systems  She specifically denies any significant head, neck, shoulder girdle symptoms. She is not having fever, chills, or sweats at this time.       Objective:   Physical Exam  She appears healthy and well-nourished; she is in no distress  Pupils were equal round reactive to light; extraocular motion is intact. No conjunctivitis present  There is no tenderness to palpation over the temples.  She has excellent range of motion of the neck and shoulders.  She has no lymphadenopathy about the neck or axilla  No skin lesions are present         Assessment & Plan:

## 2012-08-22 DIAGNOSIS — M66239 Spontaneous rupture of extensor tendons, unspecified forearm: Secondary | ICD-10-CM | POA: Diagnosis not present

## 2012-08-22 DIAGNOSIS — M65839 Other synovitis and tenosynovitis, unspecified forearm: Secondary | ICD-10-CM | POA: Diagnosis not present

## 2012-08-24 ENCOUNTER — Other Ambulatory Visit: Payer: Self-pay | Admitting: Internal Medicine

## 2012-08-27 NOTE — Telephone Encounter (Signed)
RX called in .

## 2012-08-27 NOTE — Telephone Encounter (Signed)
Refill #90; prn as Rxed

## 2012-08-27 NOTE — Telephone Encounter (Signed)
OV 08/19/12. Ativan last filled 06/24/12 #90 x 0    Plz advise    MW

## 2012-08-29 DIAGNOSIS — M65839 Other synovitis and tenosynovitis, unspecified forearm: Secondary | ICD-10-CM | POA: Diagnosis not present

## 2012-08-29 DIAGNOSIS — M66239 Spontaneous rupture of extensor tendons, unspecified forearm: Secondary | ICD-10-CM | POA: Diagnosis not present

## 2012-08-29 DIAGNOSIS — M66249 Spontaneous rupture of extensor tendons, unspecified hand: Secondary | ICD-10-CM | POA: Diagnosis not present

## 2012-09-16 ENCOUNTER — Other Ambulatory Visit: Payer: Self-pay | Admitting: Internal Medicine

## 2012-09-16 NOTE — Telephone Encounter (Signed)
Hopp please advise  

## 2012-09-16 NOTE — Telephone Encounter (Signed)
#  90 as directed

## 2012-10-14 ENCOUNTER — Other Ambulatory Visit (INDEPENDENT_AMBULATORY_CARE_PROVIDER_SITE_OTHER): Payer: Medicare Other

## 2012-10-14 DIAGNOSIS — M316 Other giant cell arteritis: Secondary | ICD-10-CM

## 2012-10-15 LAB — SEDIMENTATION RATE: Sed Rate: 9 mm/hr (ref 0–22)

## 2012-10-18 ENCOUNTER — Encounter: Payer: Self-pay | Admitting: Internal Medicine

## 2012-10-18 ENCOUNTER — Ambulatory Visit (INDEPENDENT_AMBULATORY_CARE_PROVIDER_SITE_OTHER): Payer: PRIVATE HEALTH INSURANCE | Admitting: Internal Medicine

## 2012-10-18 ENCOUNTER — Encounter: Payer: Self-pay | Admitting: Lab

## 2012-10-18 VITALS — BP 114/70 | HR 84 | Wt 109.2 lb

## 2012-10-18 DIAGNOSIS — M949 Disorder of cartilage, unspecified: Secondary | ICD-10-CM

## 2012-10-18 DIAGNOSIS — M899 Disorder of bone, unspecified: Secondary | ICD-10-CM

## 2012-10-18 DIAGNOSIS — R7309 Other abnormal glucose: Secondary | ICD-10-CM

## 2012-10-18 DIAGNOSIS — M316 Other giant cell arteritis: Secondary | ICD-10-CM

## 2012-10-18 MED ORDER — PREDNISONE 5 MG PO TABS: ORAL_TABLET | ORAL | Status: DC

## 2012-10-18 NOTE — Patient Instructions (Addendum)
Sedimentation rate in late February.

## 2012-10-18 NOTE — Assessment & Plan Note (Signed)
Bone density should be repeated in September 2014

## 2012-10-18 NOTE — Progress Notes (Signed)
  Subjective:    Patient ID: Danielle Mcgee, female    DOB: 1929/12/02, 78 y.o.   MRN: [id number]  HPI  She denies any headaches or visual changes. She is having no excessive thirst, hunger, or urination despite the prolonged steroids.She has been on   10 mg daily for 8 weeks.  Sed rate is 9    Review of Systems She is appropriately  distraught about her husband's serious health issues post fall ; he is in [facility].     Objective:   Physical Exam General appearance: thin but in good health ;adequately nourished; no acute distress or increased work of breathing is present. Appears younger than stated age  No  lymphadenopathy about the head, neck, or axilla noted.   Head: normocephalic; no temporal tenderness  Eyes: No conjunctival inflammation or lid edema is present.    Nose:  External nasal examination shows no deformity or inflammation. Nasal mucosa are pink and moist without lesions or exudates.       Neck:  No deformities,  masses, or tenderness noted.   Minimally decreased lateral  range of motion  Heart:  Normal rate and regular rhythm. S1 and S2 normal without gallop, murmur, click, rub or other extra sounds.   Lungs:Chest clear to auscultation; no wheezes, rhonchi,rales ,or rubs present. Extremities:  No cyanosis, edema, or clubbing  noted    Skin: Warm & dry           Assessment & Plan:

## 2012-10-18 NOTE — Assessment & Plan Note (Signed)
Prednisone will be decreased to 7.5 mg daily during the month of January 2014 and then 5 mg daily in February. Sedimentation rate should be repeated in late February to reassess therapy.

## 2012-10-31 ENCOUNTER — Other Ambulatory Visit: Payer: Self-pay | Admitting: Internal Medicine

## 2012-11-18 ENCOUNTER — Encounter: Payer: Self-pay | Admitting: Internal Medicine

## 2012-12-09 ENCOUNTER — Other Ambulatory Visit: Payer: Self-pay | Admitting: Internal Medicine

## 2013-01-06 ENCOUNTER — Other Ambulatory Visit (INDEPENDENT_AMBULATORY_CARE_PROVIDER_SITE_OTHER): Payer: Medicare Other

## 2013-01-06 DIAGNOSIS — M316 Other giant cell arteritis: Secondary | ICD-10-CM

## 2013-01-07 DIAGNOSIS — H35049 Retinal micro-aneurysms, unspecified, unspecified eye: Secondary | ICD-10-CM | POA: Diagnosis not present

## 2013-01-07 LAB — SEDIMENTATION RATE: Sed Rate: 10 mm/hr (ref 0–22)

## 2013-01-09 ENCOUNTER — Encounter: Payer: Self-pay | Admitting: Lab

## 2013-01-10 ENCOUNTER — Ambulatory Visit: Payer: Medicare Other | Admitting: Internal Medicine

## 2013-01-14 ENCOUNTER — Encounter: Payer: Self-pay | Admitting: *Deleted

## 2013-01-15 ENCOUNTER — Ambulatory Visit (INDEPENDENT_AMBULATORY_CARE_PROVIDER_SITE_OTHER): Payer: Medicare Other | Admitting: Internal Medicine

## 2013-01-15 ENCOUNTER — Encounter: Payer: Self-pay | Admitting: Internal Medicine

## 2013-01-15 VITALS — BP 124/80 | HR 81 | Wt 108.0 lb

## 2013-01-15 DIAGNOSIS — M316 Other giant cell arteritis: Secondary | ICD-10-CM

## 2013-01-16 MED ORDER — CLONAZEPAM 0.5 MG PO TABS: ORAL_TABLET | ORAL | Status: DC

## 2013-01-16 NOTE — Progress Notes (Signed)
  Subjective:    Patient ID: Danielle Mcgee, female    DOB: 1930/01/27, 79 y.o.   MRN: [id number]  HPI She is having difficulty adjusting to the death of her husband. 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 She's using lorazepam up to twice a day. Her insurance company has indicated they will no longer cover this agent.  She had some pain in the left occipital area and was concerned that this might be related to her temporal arteritis. Her ophthalmologist examined her and found no deficits.  Her sedimentation rate remains at goal on 5 mg of prednisone    Review of Systems She denies fever, chills, sweats, or weight loss. She's having no significant arthralgias. She has no blurred vision, double vision, or loss of vision.       Objective:   Physical Exam  She is thin but appears adequately nourished. She appears younger than her stated age.  Range of motion of the cervical spine is  slightly decreased but intact.  Pupils are equally round reactive to light. Extraocular motion is intact. Vision is intact without lenses but slightly blurred.  She has no lymphadenopathy about the neck or axilla.  She has mixed arthritic changes in hands.  Skin is clear of suspicious rashes or lesions          Assessment & Plan:  #1 temporal arteritis, stable.  #2 grief reaction  Plan: Prednisone will decrease to 5 mg one half pill daily for one month and then one half pill Monday, Wednesday, and Friday. In 2 months sedimentation rate will be repeated along with an A1c. Bone mineral density will be performed once she's been off the prednisone because of the risk of steroids for osteoporosis.  Clonazepam 0.5 one half to one at bedtime as needed will be prescribed in place of the lorazepam.

## 2013-01-16 NOTE — Patient Instructions (Signed)
Clonazepam 0.5 mg one half-1 at bedtime as needed will be substituted for the lorazepam if the latter is no longer covered on your plan.  Decrease prednisone 5 mg to one half pill daily for one month and one half pill Monday, Wednesday, and Friday.  In 2 months recheck the sedimentation rate as well as the A1c.

## 2013-01-29 DIAGNOSIS — L821 Other seborrheic keratosis: Secondary | ICD-10-CM | POA: Diagnosis not present

## 2013-01-29 DIAGNOSIS — L570 Actinic keratosis: Secondary | ICD-10-CM | POA: Diagnosis not present

## 2013-01-29 DIAGNOSIS — Z85828 Personal history of other malignant neoplasm of skin: Secondary | ICD-10-CM | POA: Diagnosis not present

## 2013-01-31 ENCOUNTER — Other Ambulatory Visit: Payer: Self-pay

## 2013-01-31 DIAGNOSIS — Z1231 Encounter for screening mammogram for malignant neoplasm of breast: Secondary | ICD-10-CM

## 2013-03-04 ENCOUNTER — Ambulatory Visit
Admission: RE | Admit: 2013-03-04 | Discharge: 2013-03-04 | Disposition: A | Discharge status: Home / Self Care | Payer: Medicare Other | Source: Ambulatory Visit

## 2013-03-04 DIAGNOSIS — Z1231 Encounter for screening mammogram for malignant neoplasm of breast: Secondary | ICD-10-CM | POA: Diagnosis not present

## 2013-03-07 ENCOUNTER — Encounter: Payer: Self-pay | Admitting: Lab

## 2013-03-10 ENCOUNTER — Other Ambulatory Visit (INDEPENDENT_AMBULATORY_CARE_PROVIDER_SITE_OTHER): Payer: Medicare Other

## 2013-03-10 DIAGNOSIS — R7309 Other abnormal glucose: Secondary | ICD-10-CM | POA: Diagnosis not present

## 2013-03-10 DIAGNOSIS — M316 Other giant cell arteritis: Secondary | ICD-10-CM

## 2013-03-11 LAB — SEDIMENTATION RATE: Sed Rate: 15 mm/hr (ref 0–22)

## 2013-03-11 LAB — HEMOGLOBIN A1C: Hgb A1c MFr Bld: 5.6 % (ref 4.6–6.5)

## 2013-03-12 ENCOUNTER — Ambulatory Visit (INDEPENDENT_AMBULATORY_CARE_PROVIDER_SITE_OTHER): Payer: Medicare Other | Admitting: Internal Medicine

## 2013-03-12 ENCOUNTER — Encounter: Payer: Self-pay | Admitting: Internal Medicine

## 2013-03-12 VITALS — BP 114/70 | HR 74 | Temp 97.7°F | Wt 107.6 lb

## 2013-03-12 DIAGNOSIS — M316 Other giant cell arteritis: Secondary | ICD-10-CM

## 2013-03-12 DIAGNOSIS — Z79899 Other long term (current) drug therapy: Secondary | ICD-10-CM | POA: Diagnosis not present

## 2013-03-12 NOTE — Patient Instructions (Addendum)
Did not refill the prednisone after you complete the present supply of 5 mg one half pill every day. Please have a repeat bone density performed 25 months from last study. Share results with all non [facility] medical staff seen

## 2013-03-12 NOTE — Progress Notes (Signed)
  Subjective:    Patient ID: Danielle Mcgee, female    DOB: 12-Feb-1930, 79 y.o.   MRN: [id number]  HPI Her ophthalmologist states there are no active ophthalmologic issues. She denies significant headache, blurred vision, double vision, or loss of vision. She's had no fever, chills, or sweats.  She has no neck/shoulder girdle or hip pain.    Review of Systems  She has had some burping and nocturnal discomfort. This did respond to Prilosec. There is no associated unexplained weight loss, melena, or rectal bleeding. She has a past history of GI intolerance with codeine.  Despite the protracted course of prednisone; her A1c remains in the nondiabetic range.     Objective:   Physical Exam  She is thin but well-nourished. She appears younger than her stated age. She appears in no distress.  She has no lymphadenopathy about the neck or axilla  There is no tenderness to palpation over the temples  There is decreased lateral rotation of the neck.  She has mild crepitus with rotation of the shoulders and with range of motion testing of the knees.  She has minor degenerative changes of the DIP joints  Regular rhythm with no significant murmur. Chest is clear with no increased work of breathing.  Skin is clear with no suspicious rashes or lesions        Assessment & Plan:

## 2013-03-12 NOTE — Assessment & Plan Note (Addendum)
She is clinically stable with no evidence of active temporal arteritis after 19 months of Prednisone. It is recommended that she complete her present course of low-dose prednisone at 2.5 mg every other day and then discontinue this medication. Followup bone density through her gynecologist is recommended 25 months from last BMD ( ?  August 2012).

## 2013-03-25 ENCOUNTER — Encounter: Payer: Self-pay | Admitting: Internal Medicine

## 2013-04-08 ENCOUNTER — Ambulatory Visit (INDEPENDENT_AMBULATORY_CARE_PROVIDER_SITE_OTHER): Payer: Medicare Other | Admitting: Internal Medicine

## 2013-04-08 ENCOUNTER — Encounter: Payer: Self-pay | Admitting: Internal Medicine

## 2013-04-08 VITALS — BP 114/66 | HR 85 | Temp 98.2°F | Wt 108.2 lb

## 2013-04-08 DIAGNOSIS — R609 Edema, unspecified: Secondary | ICD-10-CM | POA: Diagnosis not present

## 2013-04-08 DIAGNOSIS — R600 Localized edema: Secondary | ICD-10-CM

## 2013-04-08 DIAGNOSIS — J0100 Acute maxillary sinusitis, unspecified: Secondary | ICD-10-CM | POA: Diagnosis not present

## 2013-04-08 MED ORDER — AMOXICILLIN 500 MG PO CAPS: 500.0000 mg | ORAL_CAPSULE | Freq: Three times a day (TID) | ORAL | Status: DC

## 2013-04-08 MED ORDER — FLUTICASONE PROPIONATE 50 MCG/ACT NA SUSP: 1.0000 | Freq: Two times a day (BID) | NASAL | Status: DC | PRN

## 2013-04-08 NOTE — Progress Notes (Signed)
  Subjective:    Patient ID: Danielle Mcgee, female    DOB: [birth year], 79 y.o.   MRN: [id number]  HPI  She describes edema of her feet and toes since mid May over 3 days w/o treatment.This occurred several days after drive to [place]. She rode train back .There's been no specific trigger for this otherwise. She had been on prednisone for temporal arteritis but this was recently discontinued. She is not on amlodipine.   She also describes pressure in L maxillary sinus area for approximately 8 days. Discomfort up to level of 6-7/10. This is associated with postnasal drip with some yellow "green nasal discharge. She describes some globus sensation with the postnasal drainage. Aleve has been of some benefit for depression symptoms   Review of Systems  With the  edema she denies chest pain, exertional dyspnea, palpitations, claudication, or paroxysmal nocturnal dyspnea. There's been no associated fever, chills, sweats frontal headache, sore throat, or dental pain.    Objective:   Physical Exam Gen.: Thin but healthy and well-nourished in appearance. Alert, appropriate and cooperative throughout exam.Appears younger than stated age  Eyes: No corneal or conjunctival inflammation noted. Ears: External  ear exam reveals no significant lesions or deformities. Canals clear .TMs normal. Hearing is grossly normal bilaterally. Nose: External nasal exam reveals no deformity or inflammation. Nasal mucosa are pink and moist. No lesions or exudates noted.   Mouth: Oral mucosa and oropharynx reveal no lesions or exudates. Teeth in good repair. Neck: No deformities, masses, or tenderness noted. No NVD Lungs: Normal respiratory effort; chest expands symmetrically. Lungs are clear to auscultation without rales, wheezes, or increased work of breathing. Heart: Normal rate and rhythm. Split  S1 ; normal S2. No gallop, click, or rub. No murmur. Abdomen: Bowel sounds normal; abdomen soft and nontender. No masses,  organomegaly or hernias noted.No HJR                Musculoskeletal/extremities: No clubbing, cyanosis, edema, or significant extremity  deformity noted.  Able to lie down & sit up w/o help.  Vascular: Carotid, radial artery, dorsalis pedis and  posterior tibial pulses are full and equal. No bruits present. Neurologic: Alert and oriented x3.        Skin: Intact without suspicious lesions or rashes. Lymph: No cervical, axillary lymphadenopathy present. Psych: Mood and affect are normal. Normally interactive                                                                                        Assessment & Plan:  #1 edema , resolved ; ? from prolonged car travel #2 L maxillary sinusitis Plan: see orders

## 2013-04-08 NOTE — Patient Instructions (Addendum)

## 2013-04-14 DIAGNOSIS — Z124 Encounter for screening for malignant neoplasm of cervix: Secondary | ICD-10-CM | POA: Diagnosis not present

## 2013-04-14 DIAGNOSIS — Z01419 Encounter for gynecological examination (general) (routine) without abnormal findings: Secondary | ICD-10-CM | POA: Diagnosis not present

## 2013-04-30 DIAGNOSIS — M316 Other giant cell arteritis: Secondary | ICD-10-CM | POA: Diagnosis not present

## 2013-05-14 ENCOUNTER — Telehealth: Payer: Self-pay | Admitting: *Deleted

## 2013-05-14 MED ORDER — CLONAZEPAM 0.5 MG PO TABS: ORAL_TABLET | ORAL | Status: DC

## 2013-05-14 NOTE — Telephone Encounter (Signed)
Last OV 04-08-13, Last refilled 01-16-13 #30 1

## 2013-05-14 NOTE — Telephone Encounter (Signed)
Rx sent 

## 2013-05-14 NOTE — Telephone Encounter (Signed)
OK X1 

## 2013-05-30 ENCOUNTER — Other Ambulatory Visit: Payer: Self-pay | Admitting: Obstetrics & Gynecology

## 2013-05-30 DIAGNOSIS — IMO0002 Reserved for concepts with insufficient information to code with codable children: Secondary | ICD-10-CM

## 2013-06-17 ENCOUNTER — Other Ambulatory Visit: Payer: Self-pay | Admitting: Internal Medicine

## 2013-06-25 DIAGNOSIS — S5290XA Unspecified fracture of unspecified forearm, initial encounter for closed fracture: Secondary | ICD-10-CM | POA: Diagnosis not present

## 2013-06-25 DIAGNOSIS — S52209A Unspecified fracture of shaft of unspecified ulna, initial encounter for closed fracture: Secondary | ICD-10-CM | POA: Diagnosis not present

## 2013-07-01 DIAGNOSIS — S52599A Other fractures of lower end of unspecified radius, initial encounter for closed fracture: Secondary | ICD-10-CM | POA: Diagnosis not present

## 2013-07-01 DIAGNOSIS — S52209A Unspecified fracture of shaft of unspecified ulna, initial encounter for closed fracture: Secondary | ICD-10-CM | POA: Diagnosis not present

## 2013-07-09 ENCOUNTER — Other Ambulatory Visit: Payer: Medicare Other

## 2013-07-10 DIAGNOSIS — S52599A Other fractures of lower end of unspecified radius, initial encounter for closed fracture: Secondary | ICD-10-CM | POA: Diagnosis not present

## 2013-07-10 DIAGNOSIS — M25539 Pain in unspecified wrist: Secondary | ICD-10-CM | POA: Diagnosis not present

## 2013-07-29 DIAGNOSIS — Z23 Encounter for immunization: Secondary | ICD-10-CM | POA: Diagnosis not present

## 2013-07-30 ENCOUNTER — Ambulatory Visit
Admission: RE | Admit: 2013-07-30 | Discharge: 2013-07-30 | Disposition: A | Discharge status: Home / Self Care | Payer: Medicare Other | Source: Ambulatory Visit | Attending: Obstetrics & Gynecology | Admitting: Obstetrics & Gynecology

## 2013-07-30 DIAGNOSIS — IMO0002 Reserved for concepts with insufficient information to code with codable children: Secondary | ICD-10-CM

## 2013-07-30 DIAGNOSIS — M899 Disorder of bone, unspecified: Secondary | ICD-10-CM | POA: Diagnosis not present

## 2013-07-31 DIAGNOSIS — M25539 Pain in unspecified wrist: Secondary | ICD-10-CM | POA: Diagnosis not present

## 2013-07-31 DIAGNOSIS — S52599A Other fractures of lower end of unspecified radius, initial encounter for closed fracture: Secondary | ICD-10-CM | POA: Diagnosis not present

## 2013-08-13 DIAGNOSIS — S52599A Other fractures of lower end of unspecified radius, initial encounter for closed fracture: Secondary | ICD-10-CM | POA: Diagnosis not present

## 2013-08-13 DIAGNOSIS — M25539 Pain in unspecified wrist: Secondary | ICD-10-CM | POA: Diagnosis not present

## 2013-08-21 DIAGNOSIS — M25539 Pain in unspecified wrist: Secondary | ICD-10-CM | POA: Diagnosis not present

## 2013-08-22 DIAGNOSIS — M899 Disorder of bone, unspecified: Secondary | ICD-10-CM | POA: Diagnosis not present

## 2013-09-11 ENCOUNTER — Other Ambulatory Visit: Payer: Self-pay

## 2013-10-01 DIAGNOSIS — Z85828 Personal history of other malignant neoplasm of skin: Secondary | ICD-10-CM | POA: Diagnosis not present

## 2013-10-01 DIAGNOSIS — D1801 Hemangioma of skin and subcutaneous tissue: Secondary | ICD-10-CM | POA: Diagnosis not present

## 2013-10-01 DIAGNOSIS — L570 Actinic keratosis: Secondary | ICD-10-CM | POA: Diagnosis not present

## 2013-10-01 DIAGNOSIS — L821 Other seborrheic keratosis: Secondary | ICD-10-CM | POA: Diagnosis not present

## 2013-11-26 DIAGNOSIS — H35049 Retinal micro-aneurysms, unspecified, unspecified eye: Secondary | ICD-10-CM | POA: Diagnosis not present

## 2014-01-02 DIAGNOSIS — L570 Actinic keratosis: Secondary | ICD-10-CM | POA: Diagnosis not present

## 2014-02-09 ENCOUNTER — Other Ambulatory Visit: Payer: Self-pay

## 2014-02-09 DIAGNOSIS — Z1231 Encounter for screening mammogram for malignant neoplasm of breast: Secondary | ICD-10-CM

## 2014-02-26 ENCOUNTER — Encounter: Payer: Medicare Other | Admitting: Internal Medicine

## 2014-03-04 ENCOUNTER — Encounter: Payer: Medicare Other | Admitting: Internal Medicine

## 2014-03-05 ENCOUNTER — Ambulatory Visit: Payer: Medicare Other

## 2014-03-10 ENCOUNTER — Ambulatory Visit
Admission: RE | Admit: 2014-03-10 | Discharge: 2014-03-10 | Disposition: A | Discharge status: Home / Self Care | Payer: Medicare Other | Source: Ambulatory Visit

## 2014-03-10 DIAGNOSIS — Z1231 Encounter for screening mammogram for malignant neoplasm of breast: Secondary | ICD-10-CM | POA: Diagnosis not present

## 2014-03-13 ENCOUNTER — Encounter: Payer: Self-pay | Admitting: Internal Medicine

## 2014-03-13 ENCOUNTER — Ambulatory Visit (INDEPENDENT_AMBULATORY_CARE_PROVIDER_SITE_OTHER): Payer: Medicare Other | Admitting: Internal Medicine

## 2014-03-13 VITALS — BP 128/72 | HR 90 | Temp 97.5°F | Wt 110.0 lb

## 2014-03-13 DIAGNOSIS — J309 Allergic rhinitis, unspecified: Secondary | ICD-10-CM

## 2014-03-13 DIAGNOSIS — R7309 Other abnormal glucose: Secondary | ICD-10-CM | POA: Diagnosis not present

## 2014-03-13 DIAGNOSIS — E785 Hyperlipidemia, unspecified: Secondary | ICD-10-CM | POA: Diagnosis not present

## 2014-03-13 DIAGNOSIS — M949 Disorder of cartilage, unspecified: Secondary | ICD-10-CM

## 2014-03-13 DIAGNOSIS — M899 Disorder of bone, unspecified: Secondary | ICD-10-CM | POA: Diagnosis not present

## 2014-03-13 DIAGNOSIS — J3089 Other allergic rhinitis: Secondary | ICD-10-CM | POA: Insufficient documentation

## 2014-03-13 MED ORDER — MONTELUKAST SODIUM 10 MG PO TABS: 10.0000 mg | ORAL_TABLET | Freq: Every day | ORAL | Status: DC

## 2014-03-13 MED ORDER — FLUTICASONE PROPIONATE 50 MCG/ACT NA SUSP: 1.0000 | Freq: Two times a day (BID) | NASAL | Status: DC | PRN

## 2014-03-13 NOTE — Assessment & Plan Note (Signed)
A1c

## 2014-03-13 NOTE — Assessment & Plan Note (Signed)
Lipids, LFTs, TSH  

## 2014-03-13 NOTE — Progress Notes (Signed)
Pre visit review using our clinic review tool, if applicable. No additional management support is needed unless otherwise documented below in the visit note. 

## 2014-03-13 NOTE — Progress Notes (Signed)
Subjective:    Patient ID: Danielle Mcgee, female    DOB: 13-Aug-1930, 80 y.o.   MRN: [id number]  HPI She is here to assess active health issues & conditions. PMH, FH, & Social history verified & updated   A heart healthy diet is followed; no regular exercise since husband's death. Family history is + for premature coronary disease. Advanced cholesterol testing reveals  LDL goal is less than 135, ideally < 100.. To date no statin.  Low dose ASA taken  Review of Systems Specifically denied are  chest pain, palpitations, dyspnea, or claudication.   She has perennial rhinitis type symptoms which does impact her voice & singing. She does not have significant extrinsic symptoms of itchy, watery eyes, sneezing. Topical Flonase was minimal benefit.      Objective:   Physical Exam  Significant  findings on physical include: Thin but adequately nourished. Thyroid small without nodularity or asymmetry. Aorta is palpable; there is no aneurysm. She has evidence of prior knee surgery bilaterally. There is crepitus bilaterally but good range of motion. Decreased DPP.  Gen.:Appears younger than stated age  Head: Normocephalic without obvious abnormalities  Eyes: No corneal or conjunctival inflammation noted. Pupils equal round reactive to light and accommodation. Extraocular motion intact.  Ears: External  ear exam reveals no significant lesions or deformities. Canals clear .TMs normal. Hearing is grossly normal bilaterally. Nose: External nasal exam reveals no deformity or inflammation. Nasal mucosa are pink and moist. No lesions or exudates noted.   Mouth: Oral mucosa and oropharynx reveal no lesions or exudates. Teeth in good repair. Neck: No deformities, masses, or tenderness noted. Range of motion decreased. Lungs: Normal respiratory effort; chest expands symmetrically. Lungs are clear to auscultation without rales, wheezes, or increased work of breathing. Heart: Normal rate and rhythm.  Normal S1 and S2. No gallop, click, or rub. No murmur. Abdomen: Bowel sounds normal; abdomen soft and nontender. No masses, organomegaly or hernias noted. Genitalia: as per Gyn                                  Musculoskeletal/extremities: No deformity or scoliosis noted of  the thoracic or lumbar spine. No clubbing, cyanosis, edema, or significant extremity  deformity noted. Range of motion normal .Tone & strength normal. Hand joints normal.  Fingernail / toenail health good. Able to lie down & sit up w/o help. Negative SLR bilaterally Vascular: Carotid, radial artery,  and  posterior tibial pulses are full and equal. No bruits present. Neurologic: Alert and oriented x3. Deep tendon reflexes symmetrical and normal.  Gait normal  . Skin: Intact without suspicious lesions or rashes. Lymph: No cervical, axillary lymphadenopathy present. Psych: Mood and affect are normal. Normally interactive                                                                                       Assessment & Plan:  See Current Assessment & Plan in Problem List under specific DiagnosisThe labs will be reviewed and risks and options assessed. Written recommendations will be provided by mail or directly through My  Chart.Further evaluation or change in medical therapy will be directed by those results.

## 2014-03-13 NOTE — Assessment & Plan Note (Signed)
Vitamin D level 

## 2014-03-13 NOTE — Assessment & Plan Note (Addendum)
Nasal hygiene Singulair trial

## 2014-03-13 NOTE — Patient Instructions (Signed)

## 2014-03-20 ENCOUNTER — Other Ambulatory Visit (INDEPENDENT_AMBULATORY_CARE_PROVIDER_SITE_OTHER): Payer: Medicare Other

## 2014-03-20 DIAGNOSIS — M899 Disorder of bone, unspecified: Secondary | ICD-10-CM

## 2014-03-20 DIAGNOSIS — E785 Hyperlipidemia, unspecified: Secondary | ICD-10-CM | POA: Diagnosis not present

## 2014-03-20 DIAGNOSIS — R7309 Other abnormal glucose: Secondary | ICD-10-CM | POA: Diagnosis not present

## 2014-03-20 DIAGNOSIS — J309 Allergic rhinitis, unspecified: Secondary | ICD-10-CM

## 2014-03-20 DIAGNOSIS — M949 Disorder of cartilage, unspecified: Secondary | ICD-10-CM

## 2014-03-20 DIAGNOSIS — J3089 Other allergic rhinitis: Secondary | ICD-10-CM

## 2014-03-20 LAB — HEPATIC FUNCTION PANEL
ALT: 20 U/L (ref 0–35)
AST: 23 U/L (ref 0–37)
Albumin: 3.8 g/dL (ref 3.5–5.2)
Alkaline Phosphatase: 81 U/L (ref 39–117)
Bilirubin, Direct: 0.1 mg/dL (ref 0.0–0.3)
Total Bilirubin: 0.7 mg/dL (ref 0.2–1.2)
Total Protein: 6.7 g/dL (ref 6.0–8.3)

## 2014-03-20 LAB — CBC WITH DIFFERENTIAL/PLATELET
Basophils Absolute: 0 10*3/uL (ref 0.0–0.1)
Basophils Relative: 0.6 % (ref 0.0–3.0)
Eosinophils Absolute: 0.1 10*3/uL (ref 0.0–0.7)
Eosinophils Relative: 2.6 % (ref 0.0–5.0)
HCT: 42.7 % (ref 36.0–46.0)
Hemoglobin: 14.2 g/dL (ref 12.0–15.0)
Lymphocytes Relative: 36.3 % (ref 12.0–46.0)
Lymphs Abs: 1.7 10*3/uL (ref 0.7–4.0)
MCHC: 33.2 g/dL (ref 30.0–36.0)
MCV: 89.8 fl (ref 78.0–100.0)
Monocytes Absolute: 0.5 10*3/uL (ref 0.1–1.0)
Monocytes Relative: 10.7 % (ref 3.0–12.0)
Neutro Abs: 2.3 10*3/uL (ref 1.4–7.7)
Neutrophils Relative %: 49.8 % (ref 43.0–77.0)
Platelets: 245 10*3/uL (ref 150.0–400.0)
RBC: 4.75 Mil/uL (ref 3.87–5.11)
RDW: 13.1 % (ref 11.5–15.5)
WBC: 4.7 10*3/uL (ref 4.0–10.5)

## 2014-03-20 LAB — LIPID PANEL
Cholesterol: 229 mg/dL — ABNORMAL HIGH (ref 0–200)
HDL: 89.1 mg/dL (ref >39.00)
LDL Cholesterol: 127 mg/dL — ABNORMAL HIGH (ref 0–99)
Total CHOL/HDL Ratio: 3
Triglycerides: 66 mg/dL (ref 0.0–149.0)
VLDL: 13.2 mg/dL (ref 0.0–40.0)

## 2014-03-20 LAB — BASIC METABOLIC PANEL
BUN: 23 mg/dL (ref 6–23)
CO2: 28 mEq/L (ref 19–32)
Calcium: 9.3 mg/dL (ref 8.4–10.5)
Chloride: 106 mEq/L (ref 96–112)
Creatinine, Ser: 0.7 mg/dL (ref 0.4–1.2)
GFR: 85.22 mL/min (ref >60.00)
Glucose, Bld: 86 mg/dL (ref 70–99)
Potassium: 4.5 mEq/L (ref 3.5–5.1)
Sodium: 141 mEq/L (ref 135–145)

## 2014-03-20 LAB — HEMOGLOBIN A1C: Hgb A1c MFr Bld: 5.8 % (ref 4.6–6.5)

## 2014-03-20 LAB — TSH: TSH: 2.75 u[IU]/mL (ref 0.35–4.50)

## 2014-03-24 LAB — VITAMIN D 1,25 DIHYDROXY
Vitamin D 1, 25 (OH)2 Total: 52 pg/mL (ref 18–72)
Vitamin D2 1, 25 (OH)2: <8 pg/mL
Vitamin D3 1, 25 (OH)2: 52 pg/mL

## 2014-04-01 DIAGNOSIS — L821 Other seborrheic keratosis: Secondary | ICD-10-CM | POA: Diagnosis not present

## 2014-04-01 DIAGNOSIS — L570 Actinic keratosis: Secondary | ICD-10-CM | POA: Diagnosis not present

## 2014-04-01 DIAGNOSIS — Z85828 Personal history of other malignant neoplasm of skin: Secondary | ICD-10-CM | POA: Diagnosis not present

## 2014-08-13 DIAGNOSIS — Z85828 Personal history of other malignant neoplasm of skin: Secondary | ICD-10-CM | POA: Diagnosis not present

## 2014-08-13 DIAGNOSIS — L570 Actinic keratosis: Secondary | ICD-10-CM | POA: Diagnosis not present

## 2014-08-21 DIAGNOSIS — L570 Actinic keratosis: Secondary | ICD-10-CM | POA: Diagnosis not present

## 2014-08-24 DIAGNOSIS — Z23 Encounter for immunization: Secondary | ICD-10-CM | POA: Diagnosis not present

## 2014-09-18 DIAGNOSIS — L570 Actinic keratosis: Secondary | ICD-10-CM | POA: Diagnosis not present

## 2014-11-13 DIAGNOSIS — Z85828 Personal history of other malignant neoplasm of skin: Secondary | ICD-10-CM | POA: Diagnosis not present

## 2014-11-13 DIAGNOSIS — L821 Other seborrheic keratosis: Secondary | ICD-10-CM | POA: Diagnosis not present

## 2014-11-13 DIAGNOSIS — L570 Actinic keratosis: Secondary | ICD-10-CM | POA: Diagnosis not present

## 2014-11-20 DIAGNOSIS — J018 Other acute sinusitis: Secondary | ICD-10-CM | POA: Diagnosis not present

## 2014-11-30 DIAGNOSIS — H26491 Other secondary cataract, right eye: Secondary | ICD-10-CM | POA: Diagnosis not present

## 2015-01-07 DIAGNOSIS — R05 Cough: Secondary | ICD-10-CM | POA: Diagnosis not present

## 2015-01-07 DIAGNOSIS — J019 Acute sinusitis, unspecified: Secondary | ICD-10-CM | POA: Diagnosis not present

## 2015-01-07 DIAGNOSIS — J069 Acute upper respiratory infection, unspecified: Secondary | ICD-10-CM | POA: Diagnosis not present

## 2015-01-21 ENCOUNTER — Encounter: Payer: Self-pay | Admitting: Internal Medicine

## 2015-01-21 ENCOUNTER — Telehealth: Payer: Self-pay | Admitting: Internal Medicine

## 2015-01-21 ENCOUNTER — Ambulatory Visit (INDEPENDENT_AMBULATORY_CARE_PROVIDER_SITE_OTHER): Payer: Medicare Other | Admitting: Internal Medicine

## 2015-01-21 VITALS — BP 114/66 | HR 77 | Temp 98.2°F | Ht 61.0 in | Wt 113.0 lb

## 2015-01-21 DIAGNOSIS — J209 Acute bronchitis, unspecified: Secondary | ICD-10-CM | POA: Diagnosis not present

## 2015-01-21 DIAGNOSIS — J069 Acute upper respiratory infection, unspecified: Secondary | ICD-10-CM | POA: Diagnosis not present

## 2015-01-21 DIAGNOSIS — J3089 Other allergic rhinitis: Secondary | ICD-10-CM

## 2015-01-21 MED ORDER — BENZONATATE 200 MG PO CAPS: 200.0000 mg | ORAL_CAPSULE | Freq: Three times a day (TID) | ORAL | Status: DC | PRN

## 2015-01-21 MED ORDER — MONTELUKAST SODIUM 10 MG PO TABS: 10.0000 mg | ORAL_TABLET | Freq: Every day | ORAL | Status: DC

## 2015-01-21 MED ORDER — AMOXICILLIN 500 MG PO CAPS: 500.0000 mg | ORAL_CAPSULE | Freq: Three times a day (TID) | ORAL | Status: DC

## 2015-01-21 NOTE — Progress Notes (Signed)
   Subjective:    Patient ID: Danielle Mcgee, female    DOB: [birth year], 81 y.o.   MRN: [id number]  HPI She has had 2 courses of antibiotics from the [facility]; one of which was Zithromax; the other was "Achromycin". Despite that she continues to have significant nasal congestion with nasal purulence & cough productive of yellow sputum. She has associated itchy, watery eyes as well as postnasal drainage. Fatigue is an associated symptom.  She's been using Tylenol Cold over-the-counter with minimal benefit. She also uses Aleve is needed.  She has exposure to small children to whom she reads. She also has a long hair cat in her home.  There is no reflux component and she's not on ACE inhibitor.   Review of Systems She denies frontal headache, facial pain, dental pain, otic pain, otic discharge. The cough is not associated with shortness of breath or wheezing. She is not having fever, chills, sweats.      Objective:   Physical Exam  General appearance:Thin but adequately nourished; no acute distress or increased work of breathing is present.  No  lymphadenopathy about the head, neck, or axilla noted.   Eyes: No conjunctival inflammation or lid edema is present. There is no scleral icterus.  Ears:  External ear exam shows no significant lesions or deformities.  Otoscopic examination reveals clear canals, tympanic membranes are intact bilaterally without bulging, retraction, inflammation or discharge.  Nose:  External nasal examination shows no deformity or inflammation. Nasal mucosa reveals erythematous R septum without lesions or exudates. Slight  L septal dislocation .No obstruction to airflow.   Oral exam: Dental hygiene is good; lips and gums are healthy appearing.There is no oropharyngeal erythema or exudate noted.   Neck:  No deformities, thyromegaly, masses, or tenderness noted.   Supple with full range of motion without pain.   Heart:  Normal rate and regular rhythm. S1 and  S2 normal without gallop, murmur, click, rub or other extra sounds.   Lungs:Chest clear to auscultation; no wheezes, rhonchi,rales ,or rubs present.Intermittent dry cough  Extremities:  No cyanosis, edema, or clubbing  noted    Skin: Warm & dry w/o jaundice or tenting.       Assessment & Plan:  #1 acute bronchitis w/o bronchospasm #2 URI, acute Plan: See orders and recommendations

## 2015-01-21 NOTE — Patient Instructions (Signed)

## 2015-01-21 NOTE — Telephone Encounter (Signed)
Notified pt [facility].../lmb 

## 2015-01-21 NOTE — Progress Notes (Signed)
Pre visit review using our clinic review tool, if applicable. No additional management support is needed unless otherwise documented below in the visit note. 

## 2015-01-21 NOTE — Telephone Encounter (Signed)
Pt request refill for montelukast (SINGULAIR) 10 MG tablet ) to be send to [facility]. Please help

## 2015-02-01 DIAGNOSIS — M2041 Other hammer toe(s) (acquired), right foot: Secondary | ICD-10-CM | POA: Diagnosis not present

## 2015-02-02 ENCOUNTER — Ambulatory Visit (INDEPENDENT_AMBULATORY_CARE_PROVIDER_SITE_OTHER): Payer: Medicare Other | Admitting: Internal Medicine

## 2015-02-02 ENCOUNTER — Encounter: Payer: Self-pay | Admitting: Internal Medicine

## 2015-02-02 VITALS — BP 118/76 | HR 65 | Temp 97.7°F | Ht 61.0 in | Wt 114.1 lb

## 2015-02-02 DIAGNOSIS — J069 Acute upper respiratory infection, unspecified: Secondary | ICD-10-CM

## 2015-02-02 MED ORDER — AMOXICILLIN-POT CLAVULANATE 875-125 MG PO TABS: ORAL_TABLET | ORAL | Status: DC

## 2015-02-02 NOTE — Progress Notes (Signed)
   Subjective:    Patient ID: Danielle Mcgee, female    DOB: 10-29-1930, 81 y.o.   MRN: [id number]  HPI She is 90% better after the Amoxicillin. Her concern is that for the last two days she's had some postnasal drainage which is intermittently yellow with blood streaks as well as containing some lumps of blood. Intermittently she has a scratchy voice. She does have minor sneezing. She has no other bleeding dyscrasias.  She specifically denies fever, chills, frontal headache, facial pain, nasal purulence anteriorly, itchy/watery eyes, cough, wheezing,or shortness of breath.   Review of Systems Hemoptysis, hematuria, melena, or rectal bleeding denied.No unexplained weight loss, dysphagia, or abdominal pain reported. No persistently small caliber stools  There has been  no abnormal bruising or bleeding. There is no difficulty stopping bleeding with injury.     Objective:   Physical Exam General appearance:Adequately nourished; no acute distress or increased work of breathing is present.   BMI:  Lymphatic: No  lymphadenopathy about the head, neck, or axilla .  Eyes: No conjunctival inflammation or lid edema is present. There is no scleral icterus.  Ears:  External ear exam shows no significant lesions or deformities.  Otoscopic examination reveals clear canals, tympanic membranes are intact bilaterally without bulging, retraction, inflammation or discharge.  Nose:  External nasal examination shows no deformity or inflammation. Minor erythema of the right nasal septum greater than the left No septal dislocation or deviation.No obstruction to airflow.   Oral exam: Dental hygiene is good; lips and gums are healthy appearing.There is no oropharyngeal erythema or exudate .  Neck:  No deformities, thyromegaly, masses, or tenderness noted.   Supple with full range of motion without pain.   Heart:  Normal rate and regular rhythm. S1 and S2 normal without gallop, murmur, click, rub or other  extra sounds.   Lungs:Chest clear to auscultation; no wheezes, rhonchi,rales ,or rubs present.  Extremities:  No cyanosis, edema, or clubbing  noted    Skin: Warm & dry w/o tenting or jaundice. No significant lesions or rash.        Assessment & Plan:  #1 purulent postnasal drainage with some blood; history does not suggest significant sinusitis  Plan: She'll be asked to return for sinus x-rays if there is not complete resolution of symptoms with 5-7 days of Augmentin which would cover any resistant organisms.

## 2015-02-02 NOTE — Patient Instructions (Signed)

## 2015-02-02 NOTE — Progress Notes (Signed)
Pre visit review using our clinic review tool, if applicable. No additional management support is needed unless otherwise documented below in the visit note. 

## 2015-02-08 ENCOUNTER — Encounter: Payer: Self-pay | Admitting: Internal Medicine

## 2015-02-08 ENCOUNTER — Ambulatory Visit (INDEPENDENT_AMBULATORY_CARE_PROVIDER_SITE_OTHER): Payer: Medicare Other | Admitting: Internal Medicine

## 2015-02-08 VITALS — BP 120/60 | HR 90 | Temp 97.8°F | Ht 61.0 in | Wt 112.2 lb

## 2015-02-08 DIAGNOSIS — B0229 Other postherpetic nervous system involvement: Secondary | ICD-10-CM

## 2015-02-08 DIAGNOSIS — M5412 Radiculopathy, cervical region: Secondary | ICD-10-CM

## 2015-02-08 MED ORDER — FAMCICLOVIR 500 MG PO TABS: 500.0000 mg | ORAL_TABLET | Freq: Three times a day (TID) | ORAL | Status: DC

## 2015-02-08 MED ORDER — GABAPENTIN 100 MG PO CAPS: ORAL_CAPSULE | ORAL | Status: DC

## 2015-02-08 NOTE — Patient Instructions (Addendum)
No treatment is needed for the lesions; they will dry up on their own.

## 2015-02-08 NOTE — Progress Notes (Signed)
Pre visit review using our clinic review tool, if applicable. No additional management support is needed unless otherwise documented below in the visit note. 

## 2015-02-08 NOTE — Progress Notes (Signed)
   Subjective:    Patient ID: Danielle Mcgee, female    DOB: 1930/09/02, 81 y.o.   MRN: [id number]  HPI Her symptoms began 01/30/15 as a "bite" that was stinging and burning over the left upper trapezius area. Over the next several days she noted several other lesions which extended across the back and down the left upper extremity. These appeared as papules; she is unsure whether she had any vesicles. She has associated muscle aching in the left shoulder area but she also been working in her garden.  Review of Systems  No associated itchy, watery eyes.  Swelling of the lips or tongue or intraoral lesions denied.  Shortness of breath, wheezing, or cough absent.  No  pustules or urticaria noted.  Fever ,chills , or sweats denied.   Diarrhea not present.  No dysuria, pyuria or hematuria.   She had no associated numbness, tingling, or weakness in the left upper extremity.     Objective:   Physical Exam  Pertinent or positive findings include: She has scattered tiny eschar type lesions over the upper back and down the anterior and lateral aspect of the left upper extremity as far as the inferior biceps/antecubital area.  General appearance:Thin but adequately nourished; no acute distress or increased work of breathing is present.   Lymphatic: No  lymphadenopathy about the head, neck, or axilla . Eyes: No conjunctival inflammation or lid edema is present. There is no scleral icterus. Ears:  External ear exam shows no significant lesions or deformities.  Otoscopic examination reveals clear canals, tympanic membranes are intact bilaterally without bulging, retraction, inflammation or discharge. Nose:  External nasal examination shows no deformity or inflammation. Nasal mucosa are pink and moist without lesions or exudates No septal dislocation or deviation.No obstruction to airflow.  Oral exam: Dental hygiene is good; lips and gums are healthy appearing.There is no oropharyngeal  erythema or exudate . Neck:  No deformities, thyromegaly, masses, or tenderness noted.   Supple with full range of motion without pain.  Heart:  Normal rate and regular rhythm. S1 and S2 normal without gallop, murmur, click, rub or other extra sounds.  Lungs:Chest clear to auscultation; no wheezes, rhonchi,rales ,or rubs present. Extremities:  No cyanosis, edema, or clubbing  noted  Skin: Warm & dry w/o tenting or jaundice.        Assessment & Plan:  #1 Left C5 herpetic radiculopathy  The pathophysiology of herpes zoster infection was discussed. Restrictions were also delineated. Medications prescribed and indications discussed.

## 2015-03-12 ENCOUNTER — Other Ambulatory Visit: Payer: Self-pay

## 2015-03-12 DIAGNOSIS — Z1231 Encounter for screening mammogram for malignant neoplasm of breast: Secondary | ICD-10-CM

## 2015-04-02 ENCOUNTER — Ambulatory Visit: Payer: Medicare Other

## 2015-04-23 ENCOUNTER — Ambulatory Visit
Admission: RE | Admit: 2015-04-23 | Discharge: 2015-04-23 | Disposition: A | Discharge status: Home / Self Care | Payer: Medicare Other | Source: Ambulatory Visit

## 2015-04-23 DIAGNOSIS — Z1231 Encounter for screening mammogram for malignant neoplasm of breast: Secondary | ICD-10-CM | POA: Diagnosis not present

## 2015-05-03 ENCOUNTER — Other Ambulatory Visit: Payer: Self-pay

## 2015-05-18 DIAGNOSIS — J322 Chronic ethmoidal sinusitis: Secondary | ICD-10-CM | POA: Diagnosis not present

## 2015-05-18 DIAGNOSIS — J320 Chronic maxillary sinusitis: Secondary | ICD-10-CM | POA: Diagnosis not present

## 2015-05-18 DIAGNOSIS — J410 Simple chronic bronchitis: Secondary | ICD-10-CM | POA: Diagnosis not present

## 2015-05-18 DIAGNOSIS — J040 Acute laryngitis: Secondary | ICD-10-CM | POA: Diagnosis not present

## 2015-05-20 DIAGNOSIS — L820 Inflamed seborrheic keratosis: Secondary | ICD-10-CM | POA: Diagnosis not present

## 2015-05-20 DIAGNOSIS — L570 Actinic keratosis: Secondary | ICD-10-CM | POA: Diagnosis not present

## 2015-05-20 DIAGNOSIS — Z85828 Personal history of other malignant neoplasm of skin: Secondary | ICD-10-CM | POA: Diagnosis not present

## 2015-05-25 DIAGNOSIS — J322 Chronic ethmoidal sinusitis: Secondary | ICD-10-CM | POA: Diagnosis not present

## 2015-05-25 DIAGNOSIS — J040 Acute laryngitis: Secondary | ICD-10-CM | POA: Diagnosis not present

## 2015-05-25 DIAGNOSIS — J320 Chronic maxillary sinusitis: Secondary | ICD-10-CM | POA: Diagnosis not present

## 2015-06-15 ENCOUNTER — Other Ambulatory Visit (HOSPITAL_COMMUNITY): Payer: Self-pay | Admitting: Otolaryngology

## 2015-06-15 DIAGNOSIS — J040 Acute laryngitis: Secondary | ICD-10-CM | POA: Diagnosis not present

## 2015-06-15 DIAGNOSIS — R490 Dysphonia: Secondary | ICD-10-CM | POA: Diagnosis not present

## 2015-06-15 DIAGNOSIS — J322 Chronic ethmoidal sinusitis: Secondary | ICD-10-CM | POA: Diagnosis not present

## 2015-06-15 DIAGNOSIS — J320 Chronic maxillary sinusitis: Secondary | ICD-10-CM | POA: Diagnosis not present

## 2015-06-24 ENCOUNTER — Ambulatory Visit
Admission: RE | Admit: 2015-06-24 | Discharge: 2015-06-24 | Disposition: A | Discharge status: Home / Self Care | Payer: Medicare Other | Source: Ambulatory Visit | Attending: Otolaryngology | Admitting: Otolaryngology

## 2015-06-24 ENCOUNTER — Other Ambulatory Visit: Payer: Self-pay | Admitting: Otolaryngology

## 2015-06-24 DIAGNOSIS — J4550 Severe persistent asthma, uncomplicated: Secondary | ICD-10-CM | POA: Diagnosis not present

## 2015-06-29 ENCOUNTER — Ambulatory Visit (HOSPITAL_COMMUNITY)
Admission: RE | Admit: 2015-06-29 | Discharge: 2015-06-29 | Disposition: A | Discharge status: Home / Self Care | Payer: Medicare Other | Source: Ambulatory Visit | Attending: Otolaryngology | Admitting: Otolaryngology

## 2015-06-29 DIAGNOSIS — J320 Chronic maxillary sinusitis: Secondary | ICD-10-CM | POA: Insufficient documentation

## 2015-06-29 DIAGNOSIS — J3489 Other specified disorders of nose and nasal sinuses: Secondary | ICD-10-CM | POA: Diagnosis not present

## 2015-06-29 DIAGNOSIS — M8938 Hypertrophy of bone, other site: Secondary | ICD-10-CM | POA: Diagnosis not present

## 2015-06-29 DIAGNOSIS — J0100 Acute maxillary sinusitis, unspecified: Secondary | ICD-10-CM | POA: Diagnosis not present

## 2015-06-30 DIAGNOSIS — J320 Chronic maxillary sinusitis: Secondary | ICD-10-CM | POA: Diagnosis not present

## 2015-07-08 ENCOUNTER — Ambulatory Visit (INDEPENDENT_AMBULATORY_CARE_PROVIDER_SITE_OTHER): Payer: Medicare Other | Admitting: Internal Medicine

## 2015-07-08 ENCOUNTER — Encounter: Payer: Self-pay | Admitting: Internal Medicine

## 2015-07-08 VITALS — BP 136/68 | HR 82 | Temp 97.8°F | Resp 16 | Wt 111.0 lb

## 2015-07-08 DIAGNOSIS — Z23 Encounter for immunization: Secondary | ICD-10-CM | POA: Diagnosis not present

## 2015-07-08 DIAGNOSIS — J320 Chronic maxillary sinusitis: Secondary | ICD-10-CM | POA: Insufficient documentation

## 2015-07-08 NOTE — Addendum Note (Signed)
Addended by: Terence Lux B on: 07/08/2015 04:40 PM   Modules accepted: Orders

## 2015-07-08 NOTE — Patient Instructions (Signed)
You are medically cleared for surgery.

## 2015-07-08 NOTE — Progress Notes (Signed)
   Subjective:    Patient ID: Danielle Mcgee, female    DOB: 09/01/1930, 81 y.o.   MRN: [id number]  HPI   She's been seeing Dr. Ernesto Rutherford for chronic recurrent sinus infections. Endoscopic sinus surgery is planned as definitive treatment for chronic opacification of left maxillary sinus seen on CT.  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  She also has had intermittent frank sinus infections. She has some yellow nasal discharge at this time and chronic postnasal drainage; but the symptoms are minor in comparison to the active infections.Nsal hygiene has not been effective.  She was treated for temporal arteritis beginning 08/2011; she has been off the prednisone since November/2014.  She has no history of anesthesia reaction or perioperative complications.  She has no active cardiopulmonary symptoms at this time.  Review of Systems  Chest pain, palpitations, tachycardia, exertional dyspnea, paroxysmal nocturnal dyspnea, claudication or edema are absent.  She is not having fever, chills, sweats at this time despite the colored nasal secretions.      Objective:   Physical Exam General appearance is one of good health and nourishment w/o distress. She appears younger than her stated age.  Eyes: No conjunctival inflammation or scleral icterus is present.  Oral exam: Dental hygiene is good; lips and gums are healthy appearing.There is no oropharyngeal erythema or exudate noted.   Heart:  Normal rate and regular rhythm. S1 and S2 normal without gallop, murmur, click, rub or other extra sounds     Lungs:Chest clear to auscultation; no wheezes, rhonchi,rales ,or rubs present.No increased work of breathing.   Abdomen: bowel sounds normal, soft and non-tender without masses, organomegaly or hernias noted.  No guarding or rebound .   Musculoskeletal: Able to lie flat and sit up without  help. Negative straight leg raising bilaterally. Gait normal  Skin:Warm & dry.  Intact without suspicious lesions or rashes ; no jaundice or tenting  Lymphatic: No lymphadenopathy is noted about the head, neck, axilla.      Assessment & Plan:  #1 chronic left maxillary sinusitis; rule out fungal process  #2 no contraindication to the planned surgery in spite of her age.  #3 past history of temporal arteritis; status post prolonged steroids remotely.  Plan: Cleared for surgery. Perioperative steroid administration to prevent adrenal insufficiency is not clinically indicated.

## 2015-07-08 NOTE — Progress Notes (Signed)
Pre visit review using our clinic review tool, if applicable. No additional management support is needed unless otherwise documented below in the visit note. 

## 2015-07-19 DIAGNOSIS — J342 Deviated nasal septum: Secondary | ICD-10-CM | POA: Diagnosis not present

## 2015-08-10 DIAGNOSIS — N10 Acute tubulo-interstitial nephritis: Secondary | ICD-10-CM | POA: Diagnosis not present

## 2015-08-10 DIAGNOSIS — R509 Fever, unspecified: Secondary | ICD-10-CM | POA: Diagnosis not present

## 2015-08-10 DIAGNOSIS — R110 Nausea: Secondary | ICD-10-CM | POA: Diagnosis not present

## 2015-08-10 DIAGNOSIS — R5383 Other fatigue: Secondary | ICD-10-CM | POA: Diagnosis not present

## 2015-08-13 ENCOUNTER — Ambulatory Visit (INDEPENDENT_AMBULATORY_CARE_PROVIDER_SITE_OTHER): Payer: Medicare Other | Admitting: Internal Medicine

## 2015-08-13 ENCOUNTER — Encounter: Payer: Self-pay | Admitting: Internal Medicine

## 2015-08-13 VITALS — BP 128/78 | HR 66 | Temp 97.6°F | Resp 16 | Wt 110.0 lb

## 2015-08-13 DIAGNOSIS — Z23 Encounter for immunization: Secondary | ICD-10-CM | POA: Diagnosis not present

## 2015-08-13 DIAGNOSIS — N390 Urinary tract infection, site not specified: Secondary | ICD-10-CM

## 2015-08-13 NOTE — Progress Notes (Signed)
Patient received education resource, including the self-management goal and tool. Patient verbalized understanding. 

## 2015-08-13 NOTE — Progress Notes (Signed)
   Subjective:    Patient ID: Danielle Mcgee, female    DOB: January 31, 1930, 81 y.o.   MRN: [id number]  HPI She went to urgent care 08/10/15 with "kidney infection". This was manifested as left flank discomfort and tenderness. She was having slight dysuria. She was placed on Levaquin; she describes some decreased appetite and minimal nausea.   Review of Systems  She denies any dysuria, hematuria, pyuria, fever, chills, sweats, or actual vomiting. She has no change in her stools.    Objective:   Physical Exam Pertinent or positive findings include: She is thin but well-nourished. She is in no acute distress. There is minimal tenting of the skin. No CVA tenderness is present.  General appearance :adequately nourished; in no distress.  Eyes: No conjunctival inflammation or scleral icterus is present.  Oral exam:  Lips and gums are healthy appearing.There is no oropharyngeal erythema or exudate noted. Dental hygiene is good.  Heart:  Normal rate and regular rhythm. S1 and S2 normal without gallop, murmur, click, rub or other extra sounds    Lungs:Chest clear to auscultation; no wheezes, rhonchi,rales ,or rubs present.No increased work of breathing.   Abdomen: bowel sounds normal, soft and non-tender without masses, organomegaly or hernias noted.  No guarding or rebound.   Vascular : all pulses equal ; no bruits present.  Skin:Warm & dry.  Intact without suspicious lesions or rashes ; no  jaundice   Lymphatic: No lymphadenopathy is noted about the head, neck, axilla   Neuro: Strength, tone normal.         Assessment & Plan:  #1 possible urinary tract infection; we've attempted to contact the urgent care but have not been able to obtain the results. A release of records will be sent requesting results of any urine culture. In the meantime she will continue the antibiotic.

## 2015-08-13 NOTE — Patient Instructions (Signed)
Please sign a release of records to the urgent care for records related to 08/10/15 visit. Drink as much nondairy fluids as possible. Avoid spicy foods or alcohol as  these may aggravate the bladder. Do not take decongestants. Avoid narcotics if possible.

## 2015-08-17 ENCOUNTER — Telehealth: Payer: Self-pay | Admitting: *Deleted

## 2015-08-17 ENCOUNTER — Other Ambulatory Visit: Payer: Self-pay | Admitting: Internal Medicine

## 2015-08-17 DIAGNOSIS — J320 Chronic maxillary sinusitis: Secondary | ICD-10-CM

## 2015-08-17 NOTE — Telephone Encounter (Signed)
Left msg on triage stating she is needing to reschedule surgical procedure with Dr. Ernesto Rutherford, and before their off will reschedule they are needing a OK from PCP...[name]

## 2015-08-17 NOTE — Telephone Encounter (Signed)
She is cleared for surgery unless she is having fever, dysuria, purulent secretions, or diarrhea

## 2015-08-17 NOTE — Telephone Encounter (Signed)
The order verifies it is OK unless she is having fever,dysuria diarrhea or purulent secretions

## 2015-08-17 NOTE — Telephone Encounter (Signed)
order entered 

## 2015-08-17 NOTE — Telephone Encounter (Signed)
Called pt no answer LMOM RTC.../lmb 

## 2015-08-17 NOTE — Telephone Encounter (Signed)
She didn't need an order that already been done. She stated she want to reschedule her procedure with dr. Ernesto Rutherford, but they will not reschedule unless Dr. Linna Darner ok it to be reschedule...Danielle Mcgee

## 2015-08-17 NOTE — Telephone Encounter (Signed)
Pt return call back she stated sshe had to cancel first procedure due to getting sick, and when she tried to reschedule they stated she needed clearance from md again. Inform pt will send letter with md stating she is clear to have done...[name]

## 2015-08-26 NOTE — Telephone Encounter (Signed)
Pt need to make a medical clearance appt with md to discuss...Danielle Mcgee

## 2015-08-26 NOTE — Telephone Encounter (Signed)
Scheduled appt next Friday

## 2015-08-26 NOTE — Telephone Encounter (Signed)
She needs to make appointment for EKG ,etc

## 2015-08-26 NOTE — Telephone Encounter (Signed)
Patient called to advise that she spoke with dr Ernesto Rutherford this morning. Apparently he is concerned about several factors. He is concerned about her age more than anything (according to her). She is advising that she needs a more complete work up, consisting of cardiac exam, stress test. How should we proceed?

## 2015-09-03 ENCOUNTER — Ambulatory Visit (INDEPENDENT_AMBULATORY_CARE_PROVIDER_SITE_OTHER): Payer: Medicare Other | Admitting: Internal Medicine

## 2015-09-03 ENCOUNTER — Encounter: Payer: Self-pay | Admitting: Internal Medicine

## 2015-09-03 VITALS — BP 118/74 | HR 85 | Temp 97.8°F | Resp 16 | Ht 61.0 in | Wt 108.0 lb

## 2015-09-03 DIAGNOSIS — Z23 Encounter for immunization: Secondary | ICD-10-CM | POA: Diagnosis not present

## 2015-09-03 DIAGNOSIS — I517 Cardiomegaly: Secondary | ICD-10-CM

## 2015-09-03 DIAGNOSIS — J320 Chronic maxillary sinusitis: Secondary | ICD-10-CM

## 2015-09-03 DIAGNOSIS — H5440 Blindness, one eye, unspecified eye: Secondary | ICD-10-CM | POA: Insufficient documentation

## 2015-09-03 NOTE — Progress Notes (Signed)
   Subjective:    Patient ID: Danielle Mcgee, female    DOB: 10-27-1930, 81 y.o.   MRN: [id number]  HPI Her ENT is concerned about perioperative risk, specifically cardiac and pulmonary. Dr Ernesto Rutherford had ordered a [place] 06/24/15 with diagnosis of chronic sinusitis with bronchitis.The film report had "severe persistent bronchial asthma " listed for "Clinical Data", an erroneous diagnosis."Bronchitic changes" were diagnosed with borderline cardiomegaly. I personally reviewed the films. She has no active process. The borderline CM is due to pectus excavatum.  She has no symptoms except post nasal drainage and related scratchy throat. She has never smoked and has no history of asthma. She walks on a treadmill for 60 minutes @ 3 mph with variable incline 5 times per week w/o cardiopulmonary issues. She had hand surgery in 2563 w/o complications. There is no history of anesthesia issues. She had a UTI which has been treated; she has no GU symptoms.  Review of Systems  There is no significant cough, sputum production,hemoptysis, wheezing,or  paroxysmal nocturnal dyspnea. Unexplained weight loss, abdominal pain, significant dyspepsia, dysphagia, melena, rectal bleeding, or persistently small caliber stools are not present. Dysuria, pyuria, hematuria, frequency, nocturia or polyuria are denied.    Objective:   Physical Exam  General appearance : Thin but adequately nourished; in no distress.Appears younger than stated age  Eyes: No conjunctival inflammation or scleral icterus is present.  Oral exam:  Lips and gums are healthy appearing.There is no oropharyngeal erythema or exudate noted. Dental hygiene is good.  Heart:  Normal rate and regular rhythm. S1 and S2 normal without gallop, murmur, click, rub or other extra sounds    Lungs:Chest clear to auscultation; no wheezes, rhonchi,rales ,or rubs present.No increased work of breathing.   Abdomen: bowel sounds normal, soft and non-tender without  masses, organomegaly or hernias noted.  No guarding or rebound.   Vascular : all pulses equal ; no bruits present.  Skin:Warm & dry.  Intact without suspicious lesions or rashes ; no tenting or jaundice   Lymphatic: No lymphadenopathy is noted about the head, neck, axilla.   Neuro: Strength, tone & DTRs normal.      Assessment & Plan:  #1 chronic L maxillary sinusittis; symptomatic #2 MISDIAGNOSIS of asthma due to erroneous clinical data entry on CXray request . Patient exercises @ high level w/o cardiopulmonary issues. #3 borderline cardiomegaly due to pectus excavatum Plan: as Dr Ernesto Rutherford will not do surgery w/o stress test ( see VERIFIED  treadmill exercise program above which constitutes stress test); 2nd ENT opinion will be pursued.

## 2015-09-03 NOTE — Progress Notes (Signed)
Pre visit review using our clinic review tool, if applicable. No additional management support is needed unless otherwise documented below in the visit note. 

## 2015-09-03 NOTE — Patient Instructions (Signed)
The ENT referral will be scheduled and you'll be notified of the time.Please call the Referral Co-Ordinator @ [phone] if you have not been notified of appointment time within 7-10 days. 

## 2015-09-03 NOTE — Assessment & Plan Note (Signed)
2nd ENT opinion

## 2015-09-04 DIAGNOSIS — I517 Cardiomegaly: Secondary | ICD-10-CM | POA: Insufficient documentation

## 2015-09-15 DIAGNOSIS — R05 Cough: Secondary | ICD-10-CM | POA: Diagnosis not present

## 2015-09-15 DIAGNOSIS — R490 Dysphonia: Secondary | ICD-10-CM | POA: Diagnosis not present

## 2015-09-15 DIAGNOSIS — J320 Chronic maxillary sinusitis: Secondary | ICD-10-CM | POA: Diagnosis not present

## 2015-09-17 ENCOUNTER — Telehealth: Payer: Self-pay | Admitting: Emergency Medicine

## 2015-09-17 NOTE — Telephone Encounter (Signed)
Faxed OV notes to Dr Clifton James, ENT for 09/03/15 and 07/08/15

## 2015-09-21 ENCOUNTER — Encounter: Payer: Self-pay | Admitting: Internal Medicine

## 2015-09-22 DIAGNOSIS — J320 Chronic maxillary sinusitis: Secondary | ICD-10-CM | POA: Diagnosis not present

## 2015-09-24 ENCOUNTER — Other Ambulatory Visit: Payer: Self-pay | Admitting: Otolaryngology

## 2015-09-29 ENCOUNTER — Encounter (HOSPITAL_COMMUNITY): Payer: Self-pay

## 2015-09-29 ENCOUNTER — Encounter (HOSPITAL_COMMUNITY)
Admission: RE | Admit: 2015-09-29 | Discharge: 2015-09-29 | Disposition: A | Discharge status: Home / Self Care | Payer: Medicare Other | Source: Ambulatory Visit | Attending: Otolaryngology | Admitting: Otolaryngology

## 2015-09-29 DIAGNOSIS — Z01812 Encounter for preprocedural laboratory examination: Secondary | ICD-10-CM | POA: Insufficient documentation

## 2015-09-29 DIAGNOSIS — J320 Chronic maxillary sinusitis: Secondary | ICD-10-CM | POA: Insufficient documentation

## 2015-09-29 DIAGNOSIS — Q676 Pectus excavatum: Secondary | ICD-10-CM | POA: Diagnosis not present

## 2015-09-29 DIAGNOSIS — E785 Hyperlipidemia, unspecified: Secondary | ICD-10-CM | POA: Insufficient documentation

## 2015-09-29 DIAGNOSIS — Z01818 Encounter for other preprocedural examination: Secondary | ICD-10-CM | POA: Diagnosis present

## 2015-09-29 DIAGNOSIS — Z7982 Long term (current) use of aspirin: Secondary | ICD-10-CM | POA: Diagnosis not present

## 2015-09-29 HISTORY — DX: Cardiac murmur, unspecified: R01.1

## 2015-09-29 LAB — BASIC METABOLIC PANEL
Anion gap: 6 (ref 5–15)
BUN: 23 mg/dL — ABNORMAL HIGH (ref 6–20)
CO2: 29 mmol/L (ref 22–32)
Calcium: 9.6 mg/dL (ref 8.9–10.3)
Chloride: 107 mmol/L (ref 101–111)
Creatinine, Ser: 0.76 mg/dL (ref 0.44–1.00)
GFR calc Af Amer: >60 mL/min (ref >60)
GFR calc non Af Amer: >60 mL/min (ref >60)
Glucose, Bld: 108 mg/dL — ABNORMAL HIGH (ref 65–99)
Potassium: 4.4 mmol/L (ref 3.5–5.1)
Sodium: 142 mmol/L (ref 135–145)

## 2015-09-29 LAB — CBC
HCT: 40.4 % (ref 36.0–46.0)
Hemoglobin: 13 g/dL (ref 12.0–15.0)
MCH: 29.8 pg (ref 26.0–34.0)
MCHC: 32.2 g/dL (ref 30.0–36.0)
MCV: 92.7 fL (ref 78.0–100.0)
Platelets: 205 10*3/uL (ref 150–400)
RBC: 4.36 MIL/uL (ref 3.87–5.11)
RDW: 13.3 % (ref 11.5–15.5)
WBC: 4.8 10*3/uL (ref 4.0–10.5)

## 2015-09-29 NOTE — Progress Notes (Addendum)
Anesthesia PAT Evaluation: Patient is a 81 year old female scheduled for left endoscopic sinus surgery with maxillary antrostomy and removal of diseased tissue on 10/06/15 by Dr. Wilburn Cornelia: DX: Chronic maxillary sinusitis.  History includes never smoker, HLD, temporal arteritis '13-'14, DJD, benign breast disease, headaches, "mitral click murmur" (apical click without murmur documented 06/28/11 by Dr. Linna Darner, normal heart sounds documented 09/03/15), bilateral TKA '01, pectus excavatum.  Apparently, patient was initially seen by Dr. Ernesto Rutherford who was concerned about her age and cardiopulmonary perioperative risk, particularly with recent CXR showing bronchitic changes, thoracic aorta calcifications, and borderline cardiomegaly. Dr. Ernesto Rutherford wanted patient to undergo stress testing. Patient was subsequently referred back to her PCP Dr. Unice Cobble. According to his 09/03/15 note, he felt there was no active pulmonary process or evidence of asthma and that cardiomegaly was due to her pectus excavatum. At that time, patient had only post nasal drainage symptoms and was able to walk on a treadmill for 60 minutes @ 3 mph with variable incline 5 times per week w/o cardiopulmonary issues. He did not perform an EKG at that visit. Dr. Linna Darner subsequently referred patient for a second opinion, and she was seen by Dr. Wilburn Cornelia.  Meds include ASA 81 mg (on hold), Calcium + D, Vitamin D3, Centrum Silver.  PAT Vitals: T 36.1 C, HR 72, RR 20, BP 126/54, O2 sat 98%.  09/29/15 EKG: NSR, cannot rule out anterior infarct (age undetermined).  R wave slightly lower in V3 is lower when compared to tracing on 06/19/12. Reported that she recently walked four miles at [facility], [place]. She continues to for one hour on her treadmill 5 days/week. She denies chest pain, SOB, edema, syncope. On exam, heart RRR, no murmur or definite click noted. Lungs clear. No pre-tibial edema.   She denied prior echo.   06/24/15 CXR: FINDINGS:  The heart size is at the upper limits of normal. There is peribronchial thickening. Calcification and slight tortuosity of the thoracic aorta. No infiltrates or effusions. Pectus excavatum deformity. No acute osseous abnormality. IMPRESSION: Bronchitic changes. Aortic atherosclerosis.  06/29/15 Maxillofacial CT: IMPRESSION: Near complete opacification left maxillary sinus was laminated hyperdense secretions. There is bony thickening of the left maxillary sinus. Findings are suggestive of longstanding chronic sinusitis. Superimposed fungal infection not excluded based on the hyperdense secretions.  Preoperative labs noted.  Patient's PCP Dr. Linna Darner is aware that patient is in need of sinus surgery (has medical clearance note under Letters tab). By his recent note, there was no indication that he felt any additional cardiac work-up was indicated. She regularly exercises, METS > 4. No significant ST/T wave changes on today's EKG and denies cardiopulmonary symptoms. If no acute changes then I would anticipate that she could proceed as planned. Anesthesiologist Dr. Lissa Hoard agrees with this plan.  [facility] Short Stay Center/Anesthesiology Phone [phone] 09/29/2015 3:10 PM

## 2015-09-29 NOTE — Pre-Procedure Instructions (Addendum)
KIAH CAMPOLI  09/29/2015      [id number] [address], [place] - [address]  [address] Phone: [phone] Fax: [phone]    Your procedure is scheduled on 10/06/15.  Report to [facility] cone short stay admitting at 630 A.M.  Call this number if you have problems the morning of surgery:  [phone]   Remember:  Do not eat food or drink liquids after midnight.  Take these medicines the morning of surgery with A SIP OF WATER none    STOP all herbel meds, nsaids (aleve,naproxen,advil,ibuprofen) 5 days prior to surgery starting  Today including aspirin ,vitamins(, calcium, vit D, multi)   Do not wear jewelry, make-up or nail polish.  Do not wear lotions, powders, or perfumes.  You may wear deodorant.  Do not shave 48 hours prior to surgery.  Men may shave face and neck.  Do not bring valuables to the hospital.  [facility] is not responsible for any belongings or valuables.  Contacts, dentures or bridgework may not be worn into surgery.  Leave your suitcase in the car.  After surgery it may be brought to your room.  For patients admitted to the hospital, discharge time will be determined by your treatment team.  Patients discharged the day of surgery will not be allowed to drive home.   Name and phone number of your driver:    Special instructions  Special Instructions: [place] - Preparing for Surgery  Before surgery, you can play an important role.  Because skin is not sterile, your skin needs to be as free of germs as possible.  You can reduce the number of germs on you skin by washing with CHG (chlorahexidine gluconate) soap before surgery.  CHG is an antiseptic cleaner which kills germs and bonds with the skin to continue killing germs even after washing.  Please DO NOT use if you have an allergy to CHG or antibacterial soaps.  If your skin becomes reddened/irritated stop using the CHG and inform your nurse when you arrive at Short  Stay.  Do not shave (including legs and underarms) for at least 48 hours prior to the first CHG shower.  You may shave your face.  Please follow these instructions carefully:   1.  Shower with CHG Soap the night before surgery and the morning of Surgery.  2.  If you choose to wash your hair, wash your hair first as usual with your normal shampoo.  3.  After you shampoo, rinse your hair and body thoroughly to remove the Shampoo.  4.  Use CHG as you would any other liquid soap.  You can apply chg directly  to the skin and wash gently with scrungie or a clean washcloth.  5.  Apply the CHG Soap to your body ONLY FROM THE NECK DOWN.  Do not use on open wounds or open sores.  Avoid contact with your eyes ears, mouth and genitals (private parts).  Wash genitals (private parts)       with your normal soap.  6.  Wash thoroughly, paying special attention to the area where your surgery will be performed.  7.  Thoroughly rinse your body with warm water from the neck down.  8.  DO NOT shower/wash with your normal soap after using and rinsing off the CHG Soap.  9.  Pat yourself dry with a clean towel.            10.  Wear clean pajamas.  11.  Place clean sheets on your bed the night of your first shower and do not sleep with pets.  Day of Surgery  Do not apply any lotions/deodorants the morning of surgery.  Please wear clean clothes to the hospital/surgery center.  Please read over the following fact sheets that you were given. Pain Booklet, Coughing and Deep Breathing and Surgical Site Infection Prevention

## 2015-10-05 MED ORDER — CEFAZOLIN SODIUM-DEXTROSE 2-3 GM-% IV SOLR
2.0000 g | INTRAVENOUS | Status: AC
  Administered 2015-10-06: 2 g via INTRAVENOUS
  Filled 2015-10-05: qty 50

## 2015-10-05 MED ORDER — DEXAMETHASONE SODIUM PHOSPHATE 10 MG/ML IJ SOLN
10.0000 mg | Freq: Once | INTRAMUSCULAR | Status: AC
  Administered 2015-10-06: 10 mg via INTRAVENOUS
  Filled 2015-10-05: qty 1

## 2015-10-06 ENCOUNTER — Ambulatory Visit (HOSPITAL_COMMUNITY): Payer: Medicare Other | Admitting: Vascular Surgery

## 2015-10-06 ENCOUNTER — Ambulatory Visit (HOSPITAL_COMMUNITY): Payer: Medicare Other | Admitting: Anesthesiology

## 2015-10-06 ENCOUNTER — Encounter (HOSPITAL_COMMUNITY)
Admission: RE | Disposition: A | Discharge status: Home / Self Care | Payer: Self-pay | Source: Ambulatory Visit | Attending: Otolaryngology

## 2015-10-06 ENCOUNTER — Ambulatory Visit (HOSPITAL_COMMUNITY)
Admission: RE | Admit: 2015-10-06 | Discharge: 2015-10-06 | Disposition: A | Discharge status: Home / Self Care | Payer: Medicare Other | Source: Ambulatory Visit | Attending: Otolaryngology | Admitting: Otolaryngology

## 2015-10-06 ENCOUNTER — Encounter (HOSPITAL_COMMUNITY): Payer: Self-pay | Admitting: Surgery

## 2015-10-06 DIAGNOSIS — J320 Chronic maxillary sinusitis: Secondary | ICD-10-CM | POA: Diagnosis not present

## 2015-10-06 DIAGNOSIS — J343 Hypertrophy of nasal turbinates: Secondary | ICD-10-CM | POA: Insufficient documentation

## 2015-10-06 DIAGNOSIS — J322 Chronic ethmoidal sinusitis: Secondary | ICD-10-CM | POA: Diagnosis not present

## 2015-10-06 DIAGNOSIS — J3489 Other specified disorders of nose and nasal sinuses: Secondary | ICD-10-CM | POA: Diagnosis not present

## 2015-10-06 DIAGNOSIS — Z7982 Long term (current) use of aspirin: Secondary | ICD-10-CM | POA: Insufficient documentation

## 2015-10-06 DIAGNOSIS — E785 Hyperlipidemia, unspecified: Secondary | ICD-10-CM | POA: Diagnosis not present

## 2015-10-06 DIAGNOSIS — M1990 Unspecified osteoarthritis, unspecified site: Secondary | ICD-10-CM | POA: Diagnosis not present

## 2015-10-06 DIAGNOSIS — J342 Deviated nasal septum: Secondary | ICD-10-CM | POA: Insufficient documentation

## 2015-10-06 DIAGNOSIS — J321 Chronic frontal sinusitis: Secondary | ICD-10-CM | POA: Diagnosis not present

## 2015-10-06 DIAGNOSIS — J0100 Acute maxillary sinusitis, unspecified: Secondary | ICD-10-CM | POA: Diagnosis not present

## 2015-10-06 HISTORY — PX: SINUS ENDO W/FUSION: SHX777

## 2015-10-06 HISTORY — PX: ENDOSCOPIC TURBINATE REDUCTION: SHX6489

## 2015-10-06 SURGERY — SINUS SURGERY, ENDOSCOPIC, USING COMPUTER-ASSISTED NAVIGATION
Anesthesia: General | Site: Nose | Laterality: Left

## 2015-10-06 MED ORDER — SUCCINYLCHOLINE CHLORIDE 20 MG/ML IJ SOLN
INTRAMUSCULAR | Status: AC
  Filled 2015-10-06: qty 1

## 2015-10-06 MED ORDER — ARTIFICIAL TEARS OP OINT
TOPICAL_OINTMENT | OPHTHALMIC | Status: DC | PRN
  Administered 2015-10-06: 1 via OPHTHALMIC

## 2015-10-06 MED ORDER — FENTANYL CITRATE (PF) 100 MCG/2ML IJ SOLN
25.0000 ug | INTRAMUSCULAR | Status: DC | PRN
  Administered 2015-10-06: 25 ug via INTRAVENOUS

## 2015-10-06 MED ORDER — FENTANYL CITRATE (PF) 250 MCG/5ML IJ SOLN
INTRAMUSCULAR | Status: AC
  Filled 2015-10-06: qty 5

## 2015-10-06 MED ORDER — FENTANYL CITRATE (PF) 100 MCG/2ML IJ SOLN
INTRAMUSCULAR | Status: AC
  Filled 2015-10-06: qty 2

## 2015-10-06 MED ORDER — OXYMETAZOLINE HCL 0.05 % NA SOLN
NASAL | Status: AC
  Filled 2015-10-06: qty 15

## 2015-10-06 MED ORDER — FENTANYL CITRATE (PF) 100 MCG/2ML IJ SOLN: 25.0000 ug | INTRAMUSCULAR | Status: DC | PRN

## 2015-10-06 MED ORDER — PROPOFOL 10 MG/ML IV BOLUS
INTRAVENOUS | Status: DC | PRN
  Administered 2015-10-06: 100 mg via INTRAVENOUS

## 2015-10-06 MED ORDER — 0.9 % SODIUM CHLORIDE (POUR BTL) OPTIME
TOPICAL | Status: DC | PRN
  Administered 2015-10-06: 1000 mL

## 2015-10-06 MED ORDER — ONDANSETRON HCL 4 MG/2ML IJ SOLN
INTRAMUSCULAR | Status: DC | PRN
  Administered 2015-10-06: 4 mg via INTRAVENOUS

## 2015-10-06 MED ORDER — LIDOCAINE-EPINEPHRINE 1 %-1:100000 IJ SOLN
INTRAMUSCULAR | Status: AC
  Filled 2015-10-06: qty 1

## 2015-10-06 MED ORDER — ROCURONIUM BROMIDE 100 MG/10ML IV SOLN
INTRAVENOUS | Status: DC | PRN
  Administered 2015-10-06: 40 mg via INTRAVENOUS

## 2015-10-06 MED ORDER — SODIUM CHLORIDE 0.9 % IR SOLN
Status: DC | PRN
  Administered 2015-10-06: 1000 mL

## 2015-10-06 MED ORDER — LIDOCAINE HCL (CARDIAC) 20 MG/ML IV SOLN
INTRAVENOUS | Status: DC | PRN
  Administered 2015-10-06: 60 mg via INTRAVENOUS

## 2015-10-06 MED ORDER — ARTIFICIAL TEARS OP OINT
TOPICAL_OINTMENT | OPHTHALMIC | Status: AC
  Filled 2015-10-06: qty 3.5

## 2015-10-06 MED ORDER — PHENYLEPHRINE 40 MCG/ML (10ML) SYRINGE FOR IV PUSH (FOR BLOOD PRESSURE SUPPORT)
PREFILLED_SYRINGE | INTRAVENOUS | Status: AC
  Filled 2015-10-06: qty 10

## 2015-10-06 MED ORDER — LIDOCAINE HCL (CARDIAC) 20 MG/ML IV SOLN
INTRAVENOUS | Status: AC
  Filled 2015-10-06: qty 5

## 2015-10-06 MED ORDER — PROMETHAZINE HCL 25 MG/ML IJ SOLN: 6.2500 mg | INTRAMUSCULAR | Status: DC | PRN

## 2015-10-06 MED ORDER — CIPROFLOXACIN HCL 500 MG PO TABS: 500.0000 mg | ORAL_TABLET | Freq: Two times a day (BID) | ORAL | Status: DC

## 2015-10-06 MED ORDER — SODIUM CHLORIDE 0.9 % IJ SOLN
INTRAMUSCULAR | Status: AC
  Filled 2015-10-06: qty 10

## 2015-10-06 MED ORDER — GLYCOPYRROLATE 0.2 MG/ML IJ SOLN
INTRAMUSCULAR | Status: AC
  Filled 2015-10-06: qty 3

## 2015-10-06 MED ORDER — ROCURONIUM BROMIDE 50 MG/5ML IV SOLN
INTRAVENOUS | Status: AC
  Filled 2015-10-06: qty 1

## 2015-10-06 MED ORDER — PROPOFOL 10 MG/ML IV BOLUS
INTRAVENOUS | Status: AC
  Filled 2015-10-06: qty 20

## 2015-10-06 MED ORDER — GLYCOPYRROLATE 0.2 MG/ML IJ SOLN
INTRAMUSCULAR | Status: DC | PRN
  Administered 2015-10-06: 0.1 mg via INTRAVENOUS
  Administered 2015-10-06: 0.4 mg via INTRAVENOUS

## 2015-10-06 MED ORDER — LACTATED RINGERS IV SOLN
INTRAVENOUS | Status: DC | PRN
  Administered 2015-10-06 (×2): via INTRAVENOUS

## 2015-10-06 MED ORDER — ONDANSETRON HCL 4 MG/2ML IJ SOLN
INTRAMUSCULAR | Status: AC
  Filled 2015-10-06: qty 2

## 2015-10-06 MED ORDER — TRIAMCINOLONE ACETONIDE 40 MG/ML IJ SUSP
INTRAMUSCULAR | Status: AC
  Filled 2015-10-06: qty 5

## 2015-10-06 MED ORDER — NEOSTIGMINE METHYLSULFATE 10 MG/10ML IV SOLN
INTRAVENOUS | Status: AC
  Filled 2015-10-06: qty 1

## 2015-10-06 MED ORDER — HYDROCODONE-ACETAMINOPHEN 5-325 MG PO TABS: 1.0000 | ORAL_TABLET | Freq: Four times a day (QID) | ORAL | Status: DC | PRN

## 2015-10-06 MED ORDER — NEOSTIGMINE METHYLSULFATE 10 MG/10ML IV SOLN
INTRAVENOUS | Status: DC | PRN
  Administered 2015-10-06: 3 mg via INTRAVENOUS

## 2015-10-06 MED ORDER — PHENYLEPHRINE HCL 10 MG/ML IJ SOLN
INTRAMUSCULAR | Status: DC | PRN
  Administered 2015-10-06: 80 ug via INTRAVENOUS

## 2015-10-06 MED ORDER — SALINE SPRAY 0.65 % NA SOLN
1.0000 | NASAL | Status: DC | PRN
  Filled 2015-10-06: qty 44

## 2015-10-06 MED ORDER — LIDOCAINE-EPINEPHRINE 1 %-1:100000 IJ SOLN
INTRAMUSCULAR | Status: DC | PRN
  Administered 2015-10-06: 20 mL

## 2015-10-06 MED ORDER — FENTANYL CITRATE (PF) 100 MCG/2ML IJ SOLN
INTRAMUSCULAR | Status: DC | PRN
  Administered 2015-10-06 (×2): 50 ug via INTRAVENOUS

## 2015-10-06 MED ORDER — OXYMETAZOLINE HCL 0.05 % NA SOLN
NASAL | Status: DC | PRN
  Administered 2015-10-06: 1

## 2015-10-06 MED ORDER — EPHEDRINE SULFATE 50 MG/ML IJ SOLN
INTRAMUSCULAR | Status: AC
  Filled 2015-10-06: qty 1

## 2015-10-06 MED ORDER — TRIAMCINOLONE ACETONIDE 40 MG/ML IJ SUSP
INTRAMUSCULAR | Status: DC | PRN
  Administered 2015-10-06: 40 mg via INTRAMUSCULAR

## 2015-10-06 MED ORDER — MUPIROCIN CALCIUM 2 % EX CREA
TOPICAL_CREAM | CUTANEOUS | Status: DC | PRN
  Administered 2015-10-06: 1 via TOPICAL

## 2015-10-06 MED ORDER — MUPIROCIN CALCIUM 2 % EX CREA
TOPICAL_CREAM | CUTANEOUS | Status: AC
  Filled 2015-10-06: qty 15

## 2015-10-06 SURGICAL SUPPLY — 47 items
ATTRACTOMAT 16X20 MAGNETIC DRP (DRAPES) ×3 IMPLANT
BLADE ROTATE RAD 40 4 M4 (BLADE) ×3 IMPLANT
BLADE ROTATE TRICUT 4X13 M4 (BLADE) ×3 IMPLANT
BLADE SURG 15 STRL LF DISP TIS (BLADE) IMPLANT
BLADE SURG 15 STRL SS (BLADE)
CANISTER SUCTION 2500CC (MISCELLANEOUS) ×6 IMPLANT
COAGULATOR SUCT 8FR VV (MISCELLANEOUS) IMPLANT
COAGULATOR SUCT SWTCH 10FR 6 (ELECTROSURGICAL) IMPLANT
CONT SPEC 4OZ CLIKSEAL STRL BL (MISCELLANEOUS) ×1 IMPLANT
DRAPE PROXIMA HALF (DRAPES) IMPLANT
DRESSING NASAL KENNEDY 3.5X.9 (MISCELLANEOUS) IMPLANT
DRSG NASAL KENNEDY 3.5X.9 (MISCELLANEOUS)
ELECT COATED BLADE 2.86 ST (ELECTRODE) ×3 IMPLANT
ELECT REM PT RETURN 9FT ADLT (ELECTROSURGICAL) ×3
ELECTRODE REM PT RTRN 9FT ADLT (ELECTROSURGICAL) ×2 IMPLANT
FILTER ARTHROSCOPY CONVERTOR (FILTER) IMPLANT
FLUID NSS /IRRIG 1000 ML XXX (MISCELLANEOUS) ×3 IMPLANT
GLOVE BIO SURGEON STRL SZ8.5 (GLOVE) ×1 IMPLANT
GLOVE BIOGEL M 7.0 STRL (GLOVE) ×6 IMPLANT
GLOVE BIOGEL PI IND STRL 8.5 (GLOVE) IMPLANT
GLOVE BIOGEL PI INDICATOR 8.5 (GLOVE) ×1
GOWN STRL REUS W/ TWL LRG LVL3 (GOWN DISPOSABLE) ×2 IMPLANT
GOWN STRL REUS W/TWL LRG LVL3 (GOWN DISPOSABLE) ×3
KIT BASIN OR (CUSTOM PROCEDURE TRAY) ×3 IMPLANT
KIT ROOM TURNOVER OR (KITS) ×3 IMPLANT
NDL 18GX1X1/2 (RX/OR ONLY) (NEEDLE) IMPLANT
NDL HYPO 25GX1X1/2 BEV (NEEDLE) ×2 IMPLANT
NEEDLE 18GX1X1/2 (RX/OR ONLY) (NEEDLE) IMPLANT
NEEDLE HYPO 25GX1X1/2 BEV (NEEDLE) ×3 IMPLANT
NS IRRIG 1000ML POUR BTL (IV SOLUTION) ×3 IMPLANT
PAD ARMBOARD 7.5X6 YLW CONV (MISCELLANEOUS) ×6 IMPLANT
PENCIL BUTTON HOLSTER BLD 10FT (ELECTRODE) IMPLANT
SPECIMEN JAR SMALL (MISCELLANEOUS) IMPLANT
SPONGE NEURO XRAY DETECT 1X3 (DISPOSABLE) ×3 IMPLANT
SUT ETHILON 3 0 FSL (SUTURE) IMPLANT
SUT PLAIN 4 0 ~~LOC~~ 1 (SUTURE) IMPLANT
SWAB COLLECTION DEVICE MRSA (MISCELLANEOUS) ×3 IMPLANT
SYR CONTROL 10ML LL (SYRINGE) IMPLANT
TOWEL OR 17X24 6PK STRL BLUE (TOWEL DISPOSABLE) IMPLANT
TRACKER ENT INSTRUMENT (MISCELLANEOUS) ×3 IMPLANT
TRACKER ENT PATIENT (MISCELLANEOUS) ×3 IMPLANT
TRAY ENT MC OR (CUSTOM PROCEDURE TRAY) ×3 IMPLANT
TUBE ANAEROBIC SPECIMEN COL (MISCELLANEOUS) ×1 IMPLANT
TUBE CONNECTING 12X1/4 (SUCTIONS) ×3 IMPLANT
TUBING EXTENTION W/L.L. (IV SETS) ×3 IMPLANT
TUBING STRAIGHTSHOT EPS 5PK (TUBING) ×3 IMPLANT
WIPE INSTRUMENT VISIWIPE 73X73 (MISCELLANEOUS) ×3 IMPLANT

## 2015-10-06 NOTE — Anesthesia Procedure Notes (Signed)
Procedure Name: Intubation Date/Time: 10/06/2015 8:41 AM Performed by: Scheryl Darter Pre-anesthesia Checklist: Patient identified, Emergency Drugs available, Suction available, Patient being monitored and Timeout performed Patient Re-evaluated:Patient Re-evaluated prior to inductionOxygen Delivery Method: Circle system utilized Preoxygenation: Pre-oxygenation with 100% oxygen Intubation Type: IV induction Ventilation: Mask ventilation without difficulty Laryngoscope Size: Miller and 2 Grade View: Grade III Tube type: Oral Tube size: 7.5 mm Number of attempts: 2 Airway Equipment and Method: Patient positioned with wedge pillow and Stylet Placement Confirmation: ETT inserted through vocal cords under direct vision,  positive ETCO2 and breath sounds checked- equal and bilateral Secured at: 22 cm Tube secured with: Tape Dental Injury: Teeth and Oropharynx as per pre-operative assessment  Difficulty Due To: Difficulty was anticipated, Difficult Airway- due to anterior larynx and Difficult Airway- due to reduced neck mobility

## 2015-10-06 NOTE — Anesthesia Preprocedure Evaluation (Signed)
Anesthesia Evaluation  Patient identified by MRN, date of birth, ID band  Reviewed: Allergy & Precautions, NPO status , Patient's Chart, lab work & pertinent test results  History of Anesthesia Complications (+) PONV  Airway Mallampati: II  TM Distance: >3 FB Neck ROM: Full    Dental   Pulmonary neg pulmonary ROS,    breath sounds clear to auscultation       Cardiovascular + Peripheral Vascular Disease   Rhythm:Regular Rate:Normal     Neuro/Psych    GI/Hepatic negative GI ROS, Neg liver ROS,   Endo/Other  negative endocrine ROS  Renal/GU negative Renal ROS     Musculoskeletal   Abdominal   Peds  Hematology negative hematology ROS (+)   Anesthesia Other Findings   Reproductive/Obstetrics                             Anesthesia Physical Anesthesia Plan  ASA: III  Anesthesia Plan: General   Post-op Pain Management:    Induction: Intravenous  Airway Management Planned: Oral ETT  Additional Equipment:   Intra-op Plan:   Post-operative Plan: Extubation in OR  Informed Consent:   Plan Discussed with: CRNA and Anesthesiologist  Anesthesia Plan Comments:         Anesthesia Quick Evaluation

## 2015-10-06 NOTE — Anesthesia Postprocedure Evaluation (Signed)
Anesthesia Post Note  Patient: Danielle Mcgee  Procedure(s) Performed: Procedure(s) (LRB): LEFT ENDOSCOPIC SINUS SURGERY WITH MAXILLARY ANTROSTOMY AND REMOVAL OF DISEASED TISSUE  (Left) ENDOSCOPIC TURBINATE REDUCTION (Bilateral)  Patient location during evaluation: PACU Anesthesia Type: General Level of consciousness: awake Vital Signs Assessment: post-procedure vital signs reviewed and stable Respiratory status: spontaneous breathing Cardiovascular status: blood pressure returned to baseline    Last Vitals:  Filed Vitals:   10/06/15 1113 10/06/15 1115  BP:  121/88  Pulse:  65  Temp: 36.6 C   Resp:  18    Last Pain:  Filed Vitals:   10/06/15 1122  PainSc: 0-No pain                 EDWARDS,Chay Mazzoni

## 2015-10-06 NOTE — H&P (Signed)
Danielle Mcgee is an 81 y.o. female.   Chief Complaint: Chronic Left Maxillary sinusitis HPI: Pt with chronic nasal discharge and pressure   Past Medical History  Diagnosis Date  . DJD (degenerative joint disease)   . Benign breast disease 2000  . Hyperlipidemia   . Mitral click-murmur syndrome     nomitral  reguritation  . Osteopenia   . Generalized headaches   . Temporal arteritis ([place])     2013-14  . Heart murmur     Past Surgical History  Procedure Laterality Date  . Total knee arthroplasty      bilaterally  . Colonoscopy  2002 & 2013    Dr Olevia Perches  . Cataract extraction      bilaterally  . Appendectomy  1962  . Artery biopsy  09/20/2011    Procedure: BIOPSY TEMPORAL ARTERY;  Surgeon: Adin Hector, MD;  Location: [facility];  Service: General;  Laterality: Left;  . Joint replacement  2001    both-knees  . Breast biopsy  2000    Atypical Lobular Hyperplasia  . Elbow surgery  2009    lt fx  . Fracture surgery Bilateral     wrist  . Eye surgery      Family History  Problem Relation Age of Onset  . Breast cancer Mother   . Heart attack Mother     early 59s  . Osteoporosis Mother   . Diabetes Mother   . Heart attack Father 61  . Heart disease Sister     CABG in late 57s  . Cancer Sister     CERVICAL  . Diabetes Sister   . Diabetes Maternal Aunt     X2  . Diabetes Maternal Grandmother   . Stroke Neg Hx    Social History:  reports that she has never smoked. She has never used smokeless tobacco. She reports that she drinks alcohol. She reports that she does not use illicit drugs.  Allergies:  Allergies  Allergen Reactions  . Codeine     REACTION: GI PROBLEMS    Medications Prior to Admission  Medication Sig Dispense Refill  . aspirin 81 MG tablet Take 81 mg by mouth daily.      . Calcium Carbonate-Vitamin D (CALCIUM + D PO) Take 1,200 mg by mouth daily.      . Cholecalciferol (VITAMIN D3) 2000 UNITS TABS Take 1 tablet by mouth daily.     . Multiple  Vitamins-Minerals (CENTRUM SILVER PO) Take 1 tablet by mouth daily.        No results found for this or any previous visit (from the past 48 hour(s)). No results found.  Review of Systems  Constitutional: Negative.   HENT: Negative.   Cardiovascular: Negative.   Gastrointestinal: Negative.     Blood pressure 142/56, pulse 77, temperature 97.7 F (36.5 C), temperature source Oral, resp. rate 18, SpO2 100 %. Physical Exam  Constitutional: She is oriented to person, place, and time. She appears well-developed and well-nourished.  HENT:  Left Maxillary sinusitis  Neck: Normal range of motion. Neck supple.  Cardiovascular: Normal rate.   Respiratory: Effort normal.  GI: Soft.  Musculoskeletal: Normal range of motion.  Neurological: She is alert and oriented to person, place, and time.     Assessment/Plan Adm for OP Left max ESS  Danielle Mcgee 10/06/2015, 8:21 AM

## 2015-10-06 NOTE — Transfer of Care (Signed)
Immediate Anesthesia Transfer of Care Note  Patient: Danielle Mcgee  Procedure(s) Performed: Procedure(s): LEFT ENDOSCOPIC SINUS SURGERY WITH MAXILLARY ANTROSTOMY AND REMOVAL OF DISEASED TISSUE  (Left) ENDOSCOPIC TURBINATE REDUCTION (Bilateral)  Patient Location: PACU  Anesthesia Type:General  Level of Consciousness: awake, alert , oriented and sedated  Airway & Oxygen Therapy: Patient Spontanous Breathing and Patient connected to face mask oxygen  Post-op Assessment: Report given to RN, Post -op Vital signs reviewed and stable and Patient moving all extremities  Post vital signs: Reviewed and stable  Last Vitals:  Filed Vitals:   10/06/15 0639  BP: 142/56  Pulse: 77  Temp: 36.5 C  Resp: 18    Complications: No apparent anesthesia complications

## 2015-10-06 NOTE — Brief Op Note (Signed)
10/06/2015  10:14 AM  PATIENT:  Danielle Mcgee  81 y.o. female  PRE-OPERATIVE DIAGNOSIS:  1. CHRONIC MAXILLARY SINUSITIS   2. Turbinate Hypertrophy  POST-OPERATIVE DIAGNOSIS:  Same  PROCEDURE:  Procedure(s): LEFT ENDOSCOPIC SINUS SURGERY WITH MAXILLARY ANTROSTOMY AND REMOVAL OF DISEASED TISSUE  (Left) ENDOSCOPIC TURBINATE REDUCTION (Bilateral)  SURGEON:  Surgeon(s) and Role:    * Jerrell Belfast, MD - Primary  PHYSICIAN ASSISTANT:   ASSISTANTS: none   ANESTHESIA:   general  EBL:  Total I/O In: 500 [I.V.:500] Out: -  EBL: 50cc  BLOOD ADMINISTERED:none  DRAINS: none   LOCAL MEDICATIONS USED:  LIDOCAINE  and Amount: 4 ml  SPECIMEN:  Source of Specimen:  Left Maxillary Sinus  DISPOSITION OF SPECIMEN:  PATHOLOGY  COUNTS:  YES  TOURNIQUET:  * No tourniquets in log *  DICTATION: .Other Dictation: Dictation Number [id number]  PLAN OF CARE: Discharge to home after PACU  PATIENT DISPOSITION:  PACU - hemodynamically stable.   Delay start of Pharmacological VTE agent (>24hrs) due to surgical blood loss or risk of bleeding: not applicable

## 2015-10-07 ENCOUNTER — Encounter (HOSPITAL_COMMUNITY): Payer: Self-pay | Admitting: Otolaryngology

## 2015-10-07 NOTE — Op Note (Signed)
NAMEJAVIANNA, LANGLIE            ACCOUNT NO.:  [id number]  MEDICAL RECORD NO.:  [id number]  LOCATION:  [facility]                         FACILITY:  [facility]  PHYSICIAN:  Early Chars. Wilburn Cornelia, M.D.DATE OF BIRTH:  Jan 09, 1930  DATE OF PROCEDURE:  10/06/2015 DATE OF DISCHARGE:  10/06/2015                              OPERATIVE REPORT   LOCATION:  [facility] Main OR.  PREOPERATIVE DIAGNOSES: 1. Chronic left maxillary sinusitis. 2. Nasal airway obstruction with deviated septum and turbinate     hypertrophy.  POSTOPERATIVE DIAGNOSES: 1. Chronic left maxillary sinusitis. 2. Nasal airway obstruction with deviated septum and turbinate     hypertrophy.  INDICATION FOR SURGERY: 1. Chronic left maxillary sinusitis. 2. Nasal airway obstruction with deviated septum and turbinate     hypertrophy.  SURGICAL PROCEDURES: 1. Left endoscopic sinus surgery with intraoperative computer-assisted     navigation (fusion) consisting of left anterior ethmoidectomy, left     nasal frontal recess exploration, left maxillary antrostomy with     removal of diseased tissue. 2. Bilateral inferior turbinate reduction.  ANESTHESIA:  General endotracheal.  COMPLICATIONS:  None.  ESTIMATED BLOOD LOSS:  Less than 50 mL.  DISPOSITION:  The patient was transferred from the operating room to the recovery room in stable condition.  FINDINGS:  The patient had complete opacification of the left maxillary sinus with heavy polypoid disease involving the anterior ethmoid sinus, ostiomeatal complex region and left maxillary sinus.  Within the sinus, there was heavy debris consistent with chronic noninvasive fungal infection, which was completely cleared.  At the conclusion of the surgical procedure, Bactroban, Kenalog slurry was placed in the maxillary sinus, no sinus packing.  Nasal septoplasty was not performed.  BRIEF HISTORY:  The patient is an 81 year old white female, who was referred to our office for  evaluation of chronic cough, postnasal discharge and left maxillary sinusitis.  The patient is an active and very healthy 81 year old, who complains of chronic purulent postnasal discharge and cough.  Evaluation including a CT scan of the sinuses showed deviated nasal septum, bilateral inferior turbinate hypertrophy and opacification of the left maxillary sinus with polypoid changes and heterodensities consistent with possible chronic fungal sinusitis.  The patient has been treated with numerous courses of antibiotics, nasal irrigation and nasal steroids, and despite appropriate medical therapy, continued to have chronic postnasal discharge and cough.  Examination in the office showed polypoid mucosa in the left anterior ethmoid region and maxillary sinus with purulent discharge.  The patient had a significant nasal septal deviation and turbinate hypertrophy.  Given the patient's history and findings, we discussed treatment options.  I recommended left endoscopic sinus surgery with maxillary antrostomy and removal of diseased tissue.  We also discussed airway surgery including nasal septoplasty and bilateral inferior turbinate reduction.  The risks and benefits of these procedures were discussed in detail and the patient understood and agreed with our plan for surgery, which was scheduled on elective basis at [facility] under general anesthesia as an outpatient.  DESCRIPTION OF PROCEDURE:  The patient was brought to the operating room and placed in supine position on the operating table.  General endotracheal anesthesia was established without difficulty.  When the  patient was adequately anesthetized, she was positioned and prepped and draped.  A surgical time-out was performed.  This included correct identification of the patient and the surgical procedure with identification of the left laterality of the sinus surgery.  The Xomed fusion headgear was applied, and  anatomic and surgical landmarks were identified and confirmed.  The fusion device was used for navigation throughout the left endoscopic sinus surgery.  The patient's nose was then injected with a total of 4 mL of 1% lidocaine with 1:100,000 solution of epinephrine, injected in the submucosal fashion along the lateral nasal wall, middle turbinate, uncinate process and bilateral inferior turbinates.  The patient's nose was then packed with Afrin- soaked cottonoid pledgets, and they were left in place for approximately 10 minutes to allow for vasoconstriction and hemostasis.  With the patient prepped, draped and prepared for surgery, left nasal endoscopy was performed.  The patient had polypoid material within the left middle meatus and anterior ethmoid region with purulent discharge emanating from the maxillary antrostomy.  Using a through-cutting forceps, the uncinate process was reflected anteriorly and then resected in its entirety.  A straight microdebrider was then used to remove all uncinate tissue.  Dissection was then carried out to the anterior ethmoid cells removing the ethmoid bulla and dissecting from inferior to superior.  A 45-degree telescope was then used to examine the lateral aspect of the nasal wall.  The natural ostium of the maxillary sinus was completely occluded with polypoid material and there was pus draining posteriorly.  Natural ostium was carefully palpated and enlarged in an anterior, inferior and posterior direction creating widely patent left maxillary antrostomy.  Within the sinus, there was dark, solid material consistent with chronic fungal sinusitis.  This was completely removed using blunt dissection and irrigation.  The sinus itself was cleared of all infection and fungal debris.  Culture and sensitivity was obtained from the material and sent to the Laboratory for microbiology.  With the sinus clear of disease, final irrigation was undertaken, there  was no residual disease and the microdebrider was then used with a 45-degree curved tip to resect polypoid material from within the maxillary sinus. Using the 45-degree telescope, the anterior ethmoid region was inspected.  There was some polypoid material superiorly and again using a 45-degree telescope and a curved microdebrider with navigation, the anterior ethmoid cells were dissected and dissection was carried into the nasal frontal recess.  The frontal sinus was cleared of any active infection and the left sinus passageway was widely patent.  The patient's nose was then inspected.  She had a deviated septum with septal spurring to the right, but this did not appear to be impinging on the sinuses and had limited effect on her airway.  I opted not to perform a nasal septoplasty.  The bilateral inferior turbinate reduction was then performed with bipolar cautery set at 12 watts.  Two submucosal passes were made in each inferior turbinate.  When the turbinates were adequately cauterized, anterior incisions were created, overlying soft tissue was elevated and a small amount of turbinate bone was resected. The turbinates were then outfractured to create a more patent nasal cavity.  The patient's nasal cavity was then thoroughly inspected and cleared of residual fungal debris and clotted blood.  There was no active bleeding. A 50:50 mix of Kenalog 40 and Bactroban cream was then instilled as a slurry into the left maxillary sinus to reduce inflammation and infection.  There was no packing placed.  The oral  cavity and oropharynx were suctioned, and orogastric tube was passed.  Stomach contents were aspirated.  The patient was then awakened from her anesthetic.  She was extubated and transferred from the operating room to the recovery room in stable condition.  There were no complications. Estimated blood loss was less than 50 mL.          ______________________________ Early Chars.  Wilburn Cornelia, M.D.     DLS/MEDQ  D:  [id number]  T:  10/07/2015  Job:  [id number]

## 2015-10-10 LAB — WOUND CULTURE

## 2015-10-11 LAB — TISSUE CULTURE: Gram Stain: NONE SEEN

## 2015-10-11 LAB — ANAEROBIC CULTURE
Gram Stain: NONE SEEN
Gram Stain: NONE SEEN

## 2015-11-09 LAB — FUNGUS CULTURE W SMEAR

## 2015-12-08 DIAGNOSIS — H1013 Acute atopic conjunctivitis, bilateral: Secondary | ICD-10-CM | POA: Diagnosis not present

## 2015-12-08 DIAGNOSIS — J309 Allergic rhinitis, unspecified: Secondary | ICD-10-CM | POA: Diagnosis not present

## 2015-12-09 ENCOUNTER — Ambulatory Visit (INDEPENDENT_AMBULATORY_CARE_PROVIDER_SITE_OTHER): Payer: Medicare Other | Admitting: Internal Medicine

## 2015-12-09 ENCOUNTER — Encounter: Payer: Self-pay | Admitting: Internal Medicine

## 2015-12-09 VITALS — BP 110/58 | HR 99 | Temp 98.5°F | Ht 61.5 in | Wt 113.0 lb

## 2015-12-09 DIAGNOSIS — J309 Allergic rhinitis, unspecified: Secondary | ICD-10-CM

## 2015-12-09 MED ORDER — AMOXICILLIN 500 MG PO CAPS
500.0000 mg | ORAL_CAPSULE | Freq: Three times a day (TID) | ORAL | Status: DC
Start: 2015-12-09 — End: 2016-05-23

## 2015-12-09 NOTE — Progress Notes (Signed)
Pre visit review using our clinic review tool, if applicable. No additional management support is needed unless otherwise documented below in the visit note. 

## 2015-12-09 NOTE — Progress Notes (Signed)
   Subjective:    Patient ID: Danielle Mcgee, female    DOB: 06/11/1930, 82 y.o.   MRN: [id number]  HPI  Her symptoms began 1/30/ 17 as a slight sore throat which resolved with lozenges. She subsequently developed watery eyes , sneezing, and marked rhinitis. All secretions were clear. Also she noted intermittent coughing with mainly clear sputum and minimal yellow. She's had no other signs of an upper respiratory tract infection.  She went to the [facility] and was given Zyrtec.  Review of Systems  Frontal headache, facial pain , nasal purulence, dental pain,  otic pain or otic discharge denied. No fever , chills or sweats.      Objective:   Physical Exam  General appearance:Adequately nourished; no acute distress or increased work of breathing is present.    Lymphatic: No  lymphadenopathy about the head, neck, or axilla .  Eyes: No conjunctival inflammation or lid edema is present. There is no scleral icterus.  Ears:  External ear exam shows no significant lesions or deformities.  Otoscopic examination reveals clear canals, tympanic membranes are intact bilaterally without bulging, retraction, inflammation or discharge.  Nose:  External nasal examination shows no deformity or inflammation. Nasal mucosa are minimally eruthematous and moist without lesions or exudates No septal dislocation or deviation.No obstruction to airflow.   Oral exam: Dental hygiene is good; lips and gums are healthy appearing.There is no oropharyngeal erythema or exudate .  Neck:  No deformities, thyromegaly, masses, or tenderness noted.   Supple with full range of motion without pain.   Heart:  Normal rate and regular rhythm. S1 and S2 normal without gallop, murmur, click, rub or other extra sounds.   Lungs:Chest clear to auscultation; no wheezes, rhonchi,rales ,or rubs present.  Extremities:  No cyanosis, edema, or clubbing  noted    Skin: Warm & dry w/o tenting or jaundice. No significant lesions  or rash.     Assessment & Plan:  #1 allergic rhinitis See orders & AVS

## 2015-12-09 NOTE — Patient Instructions (Signed)
Plain Mucinex (NOT D) for thick secretions ;force NON dairy fluids .   Nasal cleansing in the shower as discussed with lather of mild shampoo.After 10 seconds wash off lather while  exhaling through nostrils. Make sure that all residual soap is removed to prevent irritation.  Flonase OR Nasacort AQ 1 spray in each nostril twice a day as needed. Use the "crossover" technique into opposite nostril spraying toward opposite ear @ 45 degree angle, not straight up into nostril.  Plain Allegra (NOT D )  160 daily , Loratidine 10 mg , OR Zyrtec 10 mg @ bedtime  as needed for itchy eyes & sneezing.  Fill the  prescription for antibiotic if fever, discolored nasal or chest secretions or significant pain above & below eyes appear in the next 72 hours. 

## 2016-02-09 ENCOUNTER — Encounter: Payer: Medicare Other | Admitting: Internal Medicine

## 2016-02-09 DIAGNOSIS — Z0289 Encounter for other administrative examinations: Secondary | ICD-10-CM

## 2016-03-28 ENCOUNTER — Other Ambulatory Visit: Payer: Self-pay

## 2016-03-28 DIAGNOSIS — Z1231 Encounter for screening mammogram for malignant neoplasm of breast: Secondary | ICD-10-CM

## 2016-04-24 ENCOUNTER — Ambulatory Visit: Payer: Medicare Other

## 2016-05-10 ENCOUNTER — Ambulatory Visit
Admission: RE | Admit: 2016-05-10 | Discharge: 2016-05-10 | Disposition: A | Discharge status: Home / Self Care | Payer: Medicare Other | Source: Ambulatory Visit

## 2016-05-10 DIAGNOSIS — Z1231 Encounter for screening mammogram for malignant neoplasm of breast: Secondary | ICD-10-CM | POA: Diagnosis not present

## 2016-05-15 ENCOUNTER — Other Ambulatory Visit: Payer: Self-pay | Admitting: Internal Medicine

## 2016-05-15 DIAGNOSIS — R928 Other abnormal and inconclusive findings on diagnostic imaging of breast: Secondary | ICD-10-CM

## 2016-05-16 ENCOUNTER — Other Ambulatory Visit: Payer: Self-pay | Admitting: Family Medicine

## 2016-05-19 ENCOUNTER — Other Ambulatory Visit: Payer: Self-pay

## 2016-05-19 ENCOUNTER — Ambulatory Visit
Admission: RE | Admit: 2016-05-19 | Discharge: 2016-05-19 | Disposition: A | Discharge status: Home / Self Care | Payer: Medicare Other | Source: Ambulatory Visit | Attending: Internal Medicine | Admitting: Internal Medicine

## 2016-05-19 DIAGNOSIS — R928 Other abnormal and inconclusive findings on diagnostic imaging of breast: Secondary | ICD-10-CM

## 2016-05-19 DIAGNOSIS — R922 Inconclusive mammogram: Secondary | ICD-10-CM | POA: Diagnosis not present

## 2016-05-23 ENCOUNTER — Ambulatory Visit (INDEPENDENT_AMBULATORY_CARE_PROVIDER_SITE_OTHER): Payer: Medicare Other | Admitting: Internal Medicine

## 2016-05-23 ENCOUNTER — Encounter: Payer: Self-pay | Admitting: Internal Medicine

## 2016-05-23 VITALS — BP 124/70 | HR 77 | Temp 97.5°F | Resp 16 | Wt 110.0 lb

## 2016-05-23 DIAGNOSIS — R29898 Other symptoms and signs involving the musculoskeletal system: Secondary | ICD-10-CM | POA: Diagnosis not present

## 2016-05-23 DIAGNOSIS — R7309 Other abnormal glucose: Secondary | ICD-10-CM | POA: Diagnosis not present

## 2016-05-23 DIAGNOSIS — Z0000 Encounter for general adult medical examination without abnormal findings: Secondary | ICD-10-CM | POA: Diagnosis not present

## 2016-05-23 DIAGNOSIS — E785 Hyperlipidemia, unspecified: Secondary | ICD-10-CM | POA: Diagnosis not present

## 2016-05-23 NOTE — Progress Notes (Addendum)
Subjective:    Patient ID: Danielle Mcgee, female    DOB: 10-Jun-1930, 82 y.o.   MRN: [id number]  HPI She is here to establish with a new pcp.   Here for medicare wellness exam.   For the past 4 years if she turns her head a certain way (doing her hair), she loses control of her right arm or will having tingling and her left eye flutters a little.   She did see neurology at that time and nothing concerned was seen. She was advised to follow up after three month, but her husband died and she did not follow up.  She is having symptoms still and they are getting worse.    I have personally reviewed and have noted 1.The patient's medical and social history 2.Their use of alcohol, tobacco or illicit drugs 3.Their current medications and supplements 4.The patient's functional ability including ADL's, fall risks, home safety risks and                 hearing or visual impairment. 5.Diet and physical activities 6.Evidence for depression or mood disorders 7.Care team reviewed and updated - - eye doctor - Dr Carla Drape, dermatolgy   Are there smokers in your home (other than you)? No  Risk Factors Exercise: walks regularly Dietary issues discussed: well balanced  Cardiac risk factors: advanced age  Depression Screen  Have you felt down, depressed or hopeless? No  Have you felt little interest or pleasure in doing things?  No  Activities of Daily Living In your present state of health, do you have any difficulty performing the following activities?:  Driving? No Managing money?  No Feeding yourself? No Getting from bed to chair? No Climbing a flight of stairs? No Preparing food and eating?: No Bathing or showering? No Getting dressed: No Getting to/using the toilet? No Moving around from place to place: No In the past year have you fallen or had a near fall?: No   Are you sexually active?  No  Do you have more than one  partner?  N/A  Hearing Difficulties: No Do you often ask people to speak up or repeat themselves? No Do you experience ringing or noises in your ears? No Do you have difficulty understanding soft or whispered voices? No Vision:              Any change in vision:  No              Up to date with eye exam: yes Memory:  Do you feel that you have a problem with memory? No  Do you often misplace items? No  Do you feel safe at home?  Yes  Cognitive Testing  Alert, Orientated? Yes  Normal Appearance? Yes  Recall of three objects?  Yes  Can perform simple calculations? Yes  Displays appropriate judgment? Yes  Can read the correct time from a watch face? Yes   Advanced Directives have been discussed with the patient? Yes   Medications and allergies reviewed with patient and updated if appropriate.  Patient Active Problem List   Diagnosis Date Noted  . Mild cardiomegaly 09/04/2015  . Chronic left maxillary sinusitis 07/08/2015  . Perennial allergic rhinitis 03/13/2014  . Other abnormal glucose 10/18/2012  . Temporal arteritis ([place]) 09/20/2011  . HYPERLIPIDEMIA 04/19/2009  . OSTEOPENIA 04/19/2009    Current Outpatient Prescriptions on File Prior to Visit  Medication Sig Dispense Refill  . aspirin EC 81 MG tablet Take 81 mg by mouth  daily.    . Calcium Carbonate-Vitamin D (CALCIUM + D PO) Take 1,200 mg by mouth daily.      . Cholecalciferol (VITAMIN D3) 2000 UNITS TABS Take 1 tablet by mouth daily.     . Multiple Vitamins-Minerals (CENTRUM SILVER PO) Take 1 tablet by mouth daily.       No current facility-administered medications on file prior to visit.    Past Medical History  Diagnosis Date  . DJD (degenerative joint disease)   . Benign breast disease 2000  . Hyperlipidemia   . Mitral click-murmur syndrome     nomitral  reguritation  . Osteopenia   . Generalized headaches   . Temporal arteritis ([place])     2013-14  . Heart murmur     Past Surgical History  Procedure  Laterality Date  . Total knee arthroplasty      bilaterally  . Colonoscopy  2002 & 2013    Dr Olevia Perches  . Cataract extraction      bilaterally  . Appendectomy  1962  . Artery biopsy  09/20/2011    Procedure: BIOPSY TEMPORAL ARTERY;  Surgeon: Adin Hector, MD;  Location: [facility];  Service: General;  Laterality: Left;  . Joint replacement  2001    both-knees  . Breast biopsy  2000    Atypical Lobular Hyperplasia  . Elbow surgery  2009    lt fx  . Fracture surgery Bilateral     wrist  . Eye surgery    . Sinus endo w/fusion Left 10/06/2015    Procedure: LEFT ENDOSCOPIC SINUS SURGERY WITH MAXILLARY ANTROSTOMY AND REMOVAL OF DISEASED TISSUE ;  Surgeon: Jerrell Belfast, MD;  Location: [facility];  Service: ENT;  Laterality: Left;  . Endoscopic turbinate reduction Bilateral 10/06/2015    Procedure: ENDOSCOPIC TURBINATE REDUCTION;  Surgeon: Jerrell Belfast, MD;  Location: [facility];  Service: ENT;  Laterality: Bilateral;    Social History   Social History  . Marital Status: Widowed    Spouse Name: N/A  . Number of Children: N/A  . Years of Education: N/A   Social History Main Topics  . Smoking status: Never Smoker   . Smokeless tobacco: Never Used  . Alcohol Use: Yes     Comment:  occasionally in social setting  . Drug Use: No  . Sexual Activity: Not on file   Other Topics Concern  . Not on file   Social History Narrative    Family History  Problem Relation Age of Onset  . Breast cancer Mother   . Heart attack Mother     early 36s  . Osteoporosis Mother   . Diabetes Mother   . Heart attack Father 35  . Heart disease Sister     CABG in late 23s  . Cancer Sister     CERVICAL  . Diabetes Sister   . Diabetes Maternal Aunt     X2  . Diabetes Maternal Grandmother   . Stroke Neg Hx     Review of Systems  Constitutional: Negative for fever, chills, appetite change, fatigue and unexpected weight change.  HENT: Negative for hearing loss and tinnitus.   Eyes: Negative for  visual disturbance.  Respiratory: Negative for cough, shortness of breath and wheezing.   Cardiovascular: Negative for chest pain, palpitations and leg swelling.  Gastrointestinal: Negative for nausea, abdominal pain, diarrhea, constipation and blood in stool.       No gerd  Genitourinary: Negative for dysuria and hematuria.  Musculoskeletal: Negative for back pain  and arthralgias.  Skin: Negative for color change and rash.  Neurological: Negative for dizziness, light-headedness and headaches.  Psychiatric/Behavioral: Negative for sleep disturbance and dysphoric mood. The patient is not nervous/anxious.        Objective:   Filed Vitals:   05/23/16 1408  BP: 124/70  Pulse: 77  Temp: 97.5 F (36.4 C)  Resp: 16   Filed Weights   05/23/16 1408  Weight: 110 lb (49.896 kg)   Body mass index is 20.45 kg/(m^2).   Physical Exam Constitutional: She appears well-developed and well-nourished. No distress.  HENT:  Head: Normocephalic and atraumatic.  Right Ear: External ear normal. Normal ear canal and TM Left Ear: External ear normal.  Normal ear canal and TM Mouth/Throat: Oropharynx is clear and moist.  Eyes: Conjunctivae and EOM are normal.  Neck: Neck supple. No tracheal deviation present. No thyromegaly present.  No carotid bruit  Cardiovascular: Normal rate, regular rhythm and normal heart sounds.   No murmur heard.  No edema. Pulmonary/Chest: Effort normal and breath sounds normal. No respiratory distress. She has no wheezes. She has no rales.  Breast: deferred to Gyn Abdominal: Soft. She exhibits no distension. There is no tenderness.  Lymphadenopathy: She has no cervical adenopathy.  Skin: Skin is warm and dry. She is not diaphoretic.  Psychiatric: She has a normal mood and affect. Her behavior is normal.         Assessment & Plan:   Wellness Exam: Immunizations   Up to date  Colonoscopy  No longer needed at this age 37 to date  Dexa  Up to date  74  - no longer seeing Eye exam  Up to date  Hearing loss  none Memory concerns/difficulties Independent of ADLs Stressed the importance of regular exercise   Will refer to neuro again for re-evaluation for left arm weakness/tingling and left eye fluttering when looking to the left  Hold off on blood work for now. Sugar and lipid panel have been stable  - will recheck in one year   Patient received copy of preventative screening tests/immunizations recommended for the next 5-10 years.   Binnie Rail, MD

## 2016-05-23 NOTE — Patient Instructions (Addendum)
  Ms. Consiglio , Thank you for taking time to come for your Medicare Wellness Visit. I appreciate your ongoing commitment to your health goals. Please review the following plan we discussed and let me know if I can assist you in the future.   These are the goals we discussed: Goals    None      This is a list of the screening recommended for you and due dates:  Health Maintenance  Topic Date Due  . Flu Shot  06/06/2016  . Tetanus Vaccine  09/02/2025  . DEXA scan (bone density measurement)  Completed  . Shingles Vaccine  Completed  . Pneumonia vaccines  Completed   A referral was ordered for neurology - we will call you to schedule this.   Follow up in one year.

## 2016-05-23 NOTE — Progress Notes (Signed)
Pre visit review using our clinic review tool, if applicable. No additional management support is needed unless otherwise documented below in the visit note. 

## 2016-06-16 ENCOUNTER — Encounter: Payer: Self-pay | Admitting: Neurology

## 2016-06-16 ENCOUNTER — Ambulatory Visit (INDEPENDENT_AMBULATORY_CARE_PROVIDER_SITE_OTHER): Payer: Medicare Other | Admitting: Neurology

## 2016-06-16 DIAGNOSIS — R202 Paresthesia of skin: Secondary | ICD-10-CM

## 2016-06-16 DIAGNOSIS — M79609 Pain in unspecified limb: Secondary | ICD-10-CM

## 2016-06-16 NOTE — Patient Instructions (Signed)
   We will check MRI of the neck. 

## 2016-06-16 NOTE — Progress Notes (Signed)
Reason for visit: Right arm numbness, weakness  Referring physician: Dr. Billey Chang is a 82 y.o. female  History of present illness:  Danielle Mcgee is an 82 year old right-handed white female with a history of episodes of right arm numbness and weakness dating back at least 7 years. The patient has been seen and evaluated by Dr. Jacelyn Grip from neurology in 2012. At that time, the patient underwent a CT scan of the brain, CT angiogram of the head and neck. No large vessel stenosis or impingement was seen. No evidence of a dissection was noted. The patient indicates that when she turns her head to the left in a certain way, she can bring on numbness and weakness of the right arm, with numbness going into the right shoulder and down to the hand. The patient can alleviate the symptoms but turning the head straight. The symptoms last only a few seconds. The patient denies any speech changes, she feels as if there is something going on with the left eye but she cannot describe what is happening. 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 The patient is no longer on prednisone at this time. The patient denies any involvement of the legs, she denies any issues controlling the bowels or the bladder. She has not had any balance issues. She believes that the frequency of the events involving the right arm have increased gradually over the years. She comes back in for an evaluation.  Past Medical History:  Diagnosis Date  . Benign breast disease 2000  . DJD (degenerative joint disease)   . Generalized headaches   . Heart murmur   . Hyperlipidemia   . Mitral click-murmur syndrome    nomitral  reguritation  . Osteopenia   . Temporal arteritis ([place])    2013-14    Past Surgical History:  Procedure Laterality Date  . APPENDECTOMY  1962  . ARTERY BIOPSY  09/20/2011   Procedure: BIOPSY TEMPORAL ARTERY;  Surgeon: Adin Hector, MD;  Location:  [facility];  Service: General;  Laterality: Left;  . BREAST BIOPSY  2000   Atypical Lobular Hyperplasia  . CATARACT EXTRACTION     bilaterally  . COLONOSCOPY  2002 & 2013   Dr Olevia Perches  . ELBOW SURGERY  2009   lt fx  . ENDOSCOPIC TURBINATE REDUCTION Bilateral 10/06/2015   Procedure: ENDOSCOPIC TURBINATE REDUCTION;  Surgeon: Jerrell Belfast, MD;  Location: [facility];  Service: ENT;  Laterality: Bilateral;  . EYE SURGERY    . FRACTURE SURGERY Bilateral    wrist  . JOINT REPLACEMENT  2001   both-knees  . SINUS ENDO W/FUSION Left 10/06/2015   Procedure: LEFT ENDOSCOPIC SINUS SURGERY WITH MAXILLARY ANTROSTOMY AND REMOVAL OF DISEASED TISSUE ;  Surgeon: Jerrell Belfast, MD;  Location: [facility];  Service: ENT;  Laterality: Left;  . TOTAL KNEE ARTHROPLASTY     bilaterally    Family History  Problem Relation Age of Onset  . Breast cancer Mother   . Heart attack Mother     early 85s  . Osteoporosis Mother   . Diabetes Mother   . Heart attack Father 32  . Heart disease Sister     CABG in late 39s  . Cancer Sister     CERVICAL  . Diabetes Sister   . Diabetes Maternal Aunt     X2  . Diabetes Maternal Grandmother   . Stroke Neg Hx     Social  history:  reports that she has never smoked. She has never used smokeless tobacco. She reports that she drinks about 4.2 oz of alcohol per week . She reports that she does not use drugs.  Medications:  Prior to Admission medications   Medication Sig Start Date End Date Taking? Authorizing Provider  aspirin EC 81 MG tablet Take 81 mg by mouth daily.   Yes Historical Provider, MD  Calcium Carbonate-Vitamin D (CALCIUM + D PO) Take 1,200 mg by mouth daily.     Yes Historical Provider, MD  Cholecalciferol (VITAMIN D3) 2000 UNITS TABS Take 1 tablet by mouth daily.    Yes Historical Provider, MD  Multiple Vitamins-Minerals (CENTRUM SILVER PO) Take 1 tablet by mouth daily.     Yes Historical Provider, MD      Allergies  Allergen Reactions  . Codeine      REACTION: GI PROBLEMS    ROS:  Out of a complete 14 system review of symptoms, the patient complains only of the following symptoms, and all other reviewed systems are negative.  Numbness, weakness   Blood pressure 137/82, pulse 73, height 5\' 1"  (1.549 m), weight 113 lb (51.3 kg).  Physical Exam  General: The patient is alert and cooperative at the time of the examination.  Eyes: Pupils are equal, round, and reactive to light. Discs are flat bilaterally.  Neck: The neck is supple, no carotid bruits are noted.  Respiratory: The respiratory examination is clear.  Cardiovascular: The cardiovascular examination reveals a regular rate and rhythm, no obvious murmurs or rubs are noted.  Skin: Extremities are without significant edema.  Neurologic Exam  Mental status: The patient is alert and oriented x 3 at the time of the examination. The patient has apparent normal recent and remote memory, with an apparently normal attention span and concentration ability.  Cranial nerves: Facial symmetry is present. There is good sensation of the face to pinprick and soft touch bilaterally. The strength of the facial muscles and the muscles to head turning and shoulder shrug are normal bilaterally. Speech is well enunciated, no aphasia or dysarthria is noted. Extraocular movements are full. Visual fields are full. The tongue is midline, and the patient has symmetric elevation of the soft palate. No obvious hearing deficits are noted.  Motor: The motor testing reveals 5 over 5 strength of all 4 extremities. Good symmetric motor tone is noted throughout.  Sensory: Sensory testing is intact to pinprick, soft touch, vibration sensation, and position sense on all 4 extremities. No evidence of extinction is noted.  Coordination: Cerebellar testing reveals good finger-nose-finger and heel-to-shin bilaterally.  Gait and station: Gait is normal. Tandem gait is normal. Romberg is negative. No drift is  seen.  Reflexes: Deep tendon reflexes are symmetric, but are slightly brisk bilaterally. Toes are downgoing bilaterally.   Assessment/Plan:  1. Episodic right arm numbness and weakness  The patient reports onset of right arm symptoms that are specific to head position, turning the head to the left. The patient can often times bring on symptoms at will. The patient has full resolution with turning her head back to a neutral position. Vascular compromise has been evaluated in the past, appears to be unremarkable. There is a possibility of a dynamic impingement of a blood vessel, but this would not be a treatable issue. I will check a MRI of the cervical spine to exclude spinal cord compression as an etiology for her current symptoms. If the evaluation is unremarkable, the patient will follow-up through  this office on an as-needed basis.   Jill Alexanders MD 06/16/2016 11:05 AM  [facility] [address] [place], [place] [address]  Phone [phone] Fax [phone]

## 2016-07-04 ENCOUNTER — Other Ambulatory Visit: Payer: Self-pay

## 2016-07-05 ENCOUNTER — Ambulatory Visit
Admission: RE | Admit: 2016-07-05 | Discharge: 2016-07-05 | Disposition: A | Discharge status: Home / Self Care | Payer: Medicare Other | Source: Ambulatory Visit | Attending: Neurology | Admitting: Neurology

## 2016-07-05 DIAGNOSIS — M79609 Pain in unspecified limb: Secondary | ICD-10-CM

## 2016-07-05 DIAGNOSIS — R202 Paresthesia of skin: Secondary | ICD-10-CM

## 2016-07-07 ENCOUNTER — Telehealth: Payer: Self-pay | Admitting: Neurology

## 2016-07-07 DIAGNOSIS — M79602 Pain in left arm: Secondary | ICD-10-CM

## 2016-07-07 NOTE — Telephone Encounter (Signed)
I called the patient. MRI of the cervical spine shows multilevel neuroforaminal stenosis that is worse on the left side, the patient has at least a moderate level of spinal stenosis at the C4-5 and C5-6 levels and questionable abnormality within the spinal cord. These changes may be the etiology of her symptoms that occur down the left arm with changing head position. If the patient desires, we can do an epidural steroid injection to see if this eliminates her symptoms.   MRI cervical 07/06/16:  IMPRESSION:  Abnormal MRI cervical spine (without) demonstrating: 1. At C4-5: disc bulging and facet hypertrophy with moderate spinal stenosis, moderate right and severe left foraminal stenosis 2. At C5-6: disc bulging and facet hypertrophy with moderate spinal stenosis and severe biforaminal stenosis 3. At C6-7: disc bulging and facet hypertrophy with mild spinal stenosis and moderate biforaminal stenosis 4. Slight STIR hyperintensity within the spinal from C7 to T3 on sagittal views may reflect spinal cord edema, gliosis or prominent central canal.

## 2016-07-11 NOTE — Telephone Encounter (Signed)
I called patient. The patient desires to have an epidural steroid injection. I will get this set up.

## 2016-07-11 NOTE — Addendum Note (Signed)
Addended by: Margette Fast on: 07/11/2016 05:58 PM   Modules accepted: Orders

## 2016-07-11 NOTE — Telephone Encounter (Signed)
Returned call and spoke to patient. She says that she is interested in trying cervical ESI to see if it might help relieve some of her symptoms. She will be going out of town starting 07/31/16 and will be gone for a couple of weeks. Would like to have procedure done beforehand if possible.

## 2016-07-11 NOTE — Telephone Encounter (Signed)
Patient called to advise, Dr. Jannifer Franklin left voicemail regarding MRI results, recommendation of shots, message ran out before Dr. Jannifer Franklin was through talking, please call [phone].

## 2016-07-13 ENCOUNTER — Other Ambulatory Visit: Payer: Self-pay | Admitting: Neurology

## 2016-07-13 DIAGNOSIS — M79602 Pain in left arm: Secondary | ICD-10-CM

## 2016-07-24 ENCOUNTER — Other Ambulatory Visit: Payer: Medicare Other

## 2016-07-26 ENCOUNTER — Ambulatory Visit
Admission: RE | Admit: 2016-07-26 | Discharge: 2016-07-26 | Disposition: A | Discharge status: Home / Self Care | Payer: Medicare Other | Source: Ambulatory Visit | Attending: Neurology | Admitting: Neurology

## 2016-07-26 DIAGNOSIS — M47812 Spondylosis without myelopathy or radiculopathy, cervical region: Secondary | ICD-10-CM | POA: Diagnosis not present

## 2016-07-26 DIAGNOSIS — M79602 Pain in left arm: Secondary | ICD-10-CM

## 2016-07-26 MED ORDER — IOHEXOL 300 MG/ML  SOLN: 1.0000 mL | Freq: Once | INTRAMUSCULAR | Status: DC | PRN

## 2016-07-26 MED ORDER — IOHEXOL 300 MG/ML  SOLN
1.0000 mL | Freq: Once | INTRAMUSCULAR | Status: AC | PRN
  Administered 2016-07-26: 1 mL via EPIDURAL

## 2016-07-26 MED ORDER — TRIAMCINOLONE ACETONIDE 40 MG/ML IJ SUSP (RADIOLOGY)
60.0000 mg | Freq: Once | INTRAMUSCULAR | Status: AC
  Administered 2016-07-26: 60 mg via EPIDURAL

## 2016-07-26 MED ORDER — METHYLPREDNISOLONE ACETATE 40 MG/ML INJ SUSP (RADIOLOG: 120.0000 mg | Freq: Once | INTRAMUSCULAR | Status: DC

## 2016-07-26 MED ORDER — IOPAMIDOL (ISOVUE-M 200) INJECTION 41%: 1.0000 mL | Freq: Once | INTRAMUSCULAR | Status: DC

## 2016-07-26 NOTE — Discharge Instructions (Signed)

## 2016-09-09 DIAGNOSIS — Z23 Encounter for immunization: Secondary | ICD-10-CM | POA: Diagnosis not present

## 2016-10-04 DIAGNOSIS — H1013 Acute atopic conjunctivitis, bilateral: Secondary | ICD-10-CM | POA: Diagnosis not present

## 2016-10-11 ENCOUNTER — Other Ambulatory Visit: Payer: Self-pay | Admitting: Internal Medicine

## 2016-10-11 DIAGNOSIS — N6489 Other specified disorders of breast: Secondary | ICD-10-CM

## 2016-11-20 ENCOUNTER — Other Ambulatory Visit: Payer: Medicare Other

## 2016-11-27 ENCOUNTER — Other Ambulatory Visit: Payer: Medicare Other

## 2016-11-29 DIAGNOSIS — D2262 Melanocytic nevi of left upper limb, including shoulder: Secondary | ICD-10-CM | POA: Diagnosis not present

## 2016-11-29 DIAGNOSIS — Z85828 Personal history of other malignant neoplasm of skin: Secondary | ICD-10-CM | POA: Diagnosis not present

## 2016-11-29 DIAGNOSIS — D1801 Hemangioma of skin and subcutaneous tissue: Secondary | ICD-10-CM | POA: Diagnosis not present

## 2016-11-29 DIAGNOSIS — L821 Other seborrheic keratosis: Secondary | ICD-10-CM | POA: Diagnosis not present

## 2016-11-29 DIAGNOSIS — L570 Actinic keratosis: Secondary | ICD-10-CM | POA: Diagnosis not present

## 2016-11-29 DIAGNOSIS — D2239 Melanocytic nevi of other parts of face: Secondary | ICD-10-CM | POA: Diagnosis not present

## 2016-12-07 ENCOUNTER — Other Ambulatory Visit: Payer: Medicare Other

## 2016-12-07 ENCOUNTER — Inpatient Hospital Stay: Admission: RE | Admit: 2016-12-07 | Payer: Medicare Other | Source: Ambulatory Visit

## 2016-12-27 ENCOUNTER — Ambulatory Visit
Admission: RE | Admit: 2016-12-27 | Discharge: 2016-12-27 | Disposition: A | Discharge status: Home / Self Care | Payer: Medicare Other | Source: Ambulatory Visit | Attending: Internal Medicine | Admitting: Internal Medicine

## 2016-12-27 DIAGNOSIS — N6489 Other specified disorders of breast: Secondary | ICD-10-CM

## 2017-01-29 ENCOUNTER — Ambulatory Visit (INDEPENDENT_AMBULATORY_CARE_PROVIDER_SITE_OTHER): Payer: Medicare Other | Admitting: Podiatry

## 2017-01-29 ENCOUNTER — Encounter: Payer: Self-pay | Admitting: Podiatry

## 2017-01-29 ENCOUNTER — Ambulatory Visit (INDEPENDENT_AMBULATORY_CARE_PROVIDER_SITE_OTHER): Payer: Medicare Other

## 2017-01-29 VITALS — BP 152/76 | HR 79 | Resp 16 | Ht 61.0 in | Wt 110.0 lb

## 2017-01-29 DIAGNOSIS — M779 Enthesopathy, unspecified: Secondary | ICD-10-CM

## 2017-01-29 DIAGNOSIS — L84 Corns and callosities: Secondary | ICD-10-CM

## 2017-01-29 DIAGNOSIS — M2040 Other hammer toe(s) (acquired), unspecified foot: Secondary | ICD-10-CM | POA: Diagnosis not present

## 2017-01-29 MED ORDER — TRIAMCINOLONE ACETONIDE 10 MG/ML IJ SUSP
10.0000 mg | Freq: Once | INTRAMUSCULAR | Status: AC
  Administered 2017-01-29: 10 mg

## 2017-01-29 NOTE — Progress Notes (Signed)
   Subjective:    Patient ID: Danielle Mcgee, female    DOB: Jul 05, 1930, 83 y.o.   MRN: [id number]  HPI Chief Complaint  Patient presents with  . Callouses    Right foot, 2nd toe- top of toe; pt stated, "Has been hurting since Christmas 2017"; x3 months      Review of Systems  All other systems reviewed and are negative.      Objective:   Physical Exam        Assessment & Plan:

## 2017-01-31 NOTE — Progress Notes (Signed)
Subjective:     Patient ID: Danielle Mcgee, female   DOB: 1930/07/12, 83 y.o.   MRN: [id number]  HPI patient presents with irritation of the top of the right second toe with fluid buildup noted and states it's been present for around 3 months and it seems the toe has gotten more rigid over that time   Review of Systems  All other systems reviewed and are negative.      Objective:   Physical Exam  Constitutional: She is oriented to person, place, and time.  Cardiovascular: Intact distal pulses.   Musculoskeletal: Normal range of motion.  Neurological: She is oriented to person, place, and time.  Skin: Skin is warm.  Nursing note and vitals reviewed.  neurovascular status intact muscle strength adequate range of motion within normal limits with patient second toe right found to be rigid with inflammation and fluid around the head of the proximal phalanx. There is a keratotic lesion present but no drainage or proximal erythema erythema noted     Assessment:     Hammertoe deformity second digit right foot rigid in nature with inflammatory fluid in capsulitis of the joint surface along with lesion formation    Plan:     H&P x-ray reviewed and at this time I did a proximal nerve block of the second digit and under sterile conditions I carefully injected the interphalangeal joint 1 mg dexamethasone 1 mg Kenalog debrided lesion applied padding and explained that this may require surgery if symptoms were to persist  X-ray indicates significant rigid digital deformity second right

## 2017-05-07 ENCOUNTER — Other Ambulatory Visit: Payer: Self-pay | Admitting: Internal Medicine

## 2017-05-07 DIAGNOSIS — Z1231 Encounter for screening mammogram for malignant neoplasm of breast: Secondary | ICD-10-CM

## 2017-05-16 DIAGNOSIS — M316 Other giant cell arteritis: Secondary | ICD-10-CM | POA: Diagnosis not present

## 2017-05-28 ENCOUNTER — Ambulatory Visit
Admission: RE | Admit: 2017-05-28 | Discharge: 2017-05-28 | Disposition: A | Discharge status: Home / Self Care | Payer: Medicare Other | Source: Ambulatory Visit | Attending: Internal Medicine | Admitting: Internal Medicine

## 2017-05-28 DIAGNOSIS — Z1231 Encounter for screening mammogram for malignant neoplasm of breast: Secondary | ICD-10-CM | POA: Diagnosis not present

## 2017-05-30 DIAGNOSIS — H1013 Acute atopic conjunctivitis, bilateral: Secondary | ICD-10-CM | POA: Diagnosis not present

## 2017-06-10 DIAGNOSIS — R7303 Prediabetes: Secondary | ICD-10-CM

## 2017-06-10 DIAGNOSIS — R739 Hyperglycemia, unspecified: Secondary | ICD-10-CM | POA: Insufficient documentation

## 2017-06-10 NOTE — Assessment & Plan Note (Signed)
Check lipid panel, tsh, cmp, cbc Stressed regular exercise and low fat/chol diet

## 2017-06-10 NOTE — Patient Instructions (Addendum)
  Danielle Mcgee , Thank you for taking time to come for your Medicare Wellness Visit. I appreciate your ongoing commitment to your health goals. Please review the following plan we discussed and let me know if I can assist you in the future.   These are the goals we discussed: Goals    None      This is a list of the screening recommended for you and due dates:  Health Maintenance  Topic Date Due  . DEXA scan (bone density measurement)  07/30/2016  . Flu Shot  06/06/2017  . Tetanus Vaccine  09/02/2025  . Pneumonia vaccines  Completed     Test(s) ordered today. Your results will be released to [facility] (or called to you) after review, usually within 72hours after test completion. If any changes need to be made, you will be notified at that same time.  All other Health Maintenance issues reviewed.   All recommended immunizations and age-appropriate screenings are up-to-date or discussed.  No immunizations administered today. Consider the shingles vaccine.  Medications reviewed and updated.  Changes include trying myrbetriq for your bladder.   Your prescription(s) have been submitted to your pharmacy. Please take as directed and contact our office if you believe you are having problem(s) with the medication(s).  A dexa scan was ordered for your bones.   Please followup in one year

## 2017-06-10 NOTE — Progress Notes (Signed)
Subjective:    Patient ID: Danielle Mcgee, female    DOB: 1930/03/25, 83 y.o.   MRN: [id number]  HPI Here for a subsequent medicare wellness exam and follow up of chronic medical problems.   I have personally reviewed and have noted 1.The patient's medical and social history 2.Their use of alcohol, tobacco or illicit drugs 3.Their current medications and supplements 4.The patient's functional ability including ADL's, fall risks, home                 safety risk and hearing or visual impairment. 5.Diet and physical activities 6.Evidence for depression or mood disorders 7.Care team reviewed  - eye doctor Dr Carla Drape, dermatology  Hyperlipidemia: She is not on any medication and has been controlling her cholesterol with lifestyle.  She is compliant with a low fat/cholesterol diet. She is not exercising regularly.     Prediabetes:  She is compliant with a low sugar/carbohydrate diet.  She is not exercising regularly.  Urinary incontinence:  This has gotten worse. She has mixed stress and urge incontinence.   She has done PT in the past.  She wears a pad. She has never tried medication.     Are there smokers in your home (other than you)? No  Risk Factors Exercise: active, not exercising regularly, has stairs in her house Dietary issues discussed:   She eats what she wants.  She watches her portions.  She is unsure if she eats well balanced.    Cardiac risk factors: advanced age  Depression Screen  Have you felt down, depressed or hopeless? No  Have you felt little interest or pleasure in doing things?  No  Activities of Daily Living In your present state of health, do you have any difficulty performing the following activities?:  Driving? No Managing money?  No Feeding yourself? No Getting from bed to chair? No Climbing a flight of stairs? No Preparing food and eating?: No Bathing or showering? No Getting  dressed: No Getting to/using the toilet? No Moving around from place to place: No In the past year have you fallen or had a near fall?: No   Are you sexually active?  No  Do you have more than one partner?  N/A  Hearing Difficulties: No Do you often ask people to speak up or repeat themselves? No Do you experience ringing or noises in your ears? No Do you have difficulty understanding soft or whispered voices? No Vision:              Any change in vision:  no             Up to date with eye exam:   yes  Memory:  Do you feel that you have a problem with memory? No  Do you often misplace items? No  Do you feel safe at home?  Yes  Cognitive Testing  Alert, Orientated? Yes  Normal Appearance? Yes  Recall of three objects?  Yes  Can perform simple calculations? Yes  Displays appropriate judgment? Yes  Can read the correct time from a watch face? Yes   Advanced Directives have been discussed with the patient? Yes   Medications and allergies reviewed with patient and updated if appropriate.  Patient Active Problem List   Diagnosis Date Noted  . Prediabetes 06/10/2017  . Paresthesia and pain of right extremity 06/16/2016  . Mild cardiomegaly 09/04/2015  . Chronic left maxillary sinusitis 07/08/2015  . Perennial allergic rhinitis 03/13/2014  . Temporal arteritis ([place])  09/20/2011  . Hyperlipidemia 04/19/2009  . OSTEOPENIA 04/19/2009    Current Outpatient Prescriptions on File Prior to Visit  Medication Sig Dispense Refill  . aspirin EC 81 MG tablet Take 81 mg by mouth daily.    . Calcium Carbonate-Vitamin D (CALCIUM + D PO) Take 1,200 mg by mouth daily.      . Multiple Vitamins-Minerals (CENTRUM SILVER PO) Take 1 tablet by mouth daily.       No current facility-administered medications on file prior to visit.     Past Medical History:  Diagnosis Date  . Benign breast disease 2000  . DJD (degenerative joint disease)   . Generalized headaches   . Heart murmur   .  Hyperlipidemia   . Mitral click-murmur syndrome    nomitral  reguritation  . Osteopenia   . Temporal arteritis ([place])    2013-14    Past Surgical History:  Procedure Laterality Date  . APPENDECTOMY  1962  . ARTERY BIOPSY  09/20/2011   Procedure: BIOPSY TEMPORAL ARTERY;  Surgeon: Adin Hector, MD;  Location: [facility];  Service: General;  Laterality: Left;  . BREAST BIOPSY  2000   Atypical Lobular Hyperplasia  . CATARACT EXTRACTION     bilaterally  . COLONOSCOPY  2002 & 2013   Dr Olevia Perches  . ELBOW SURGERY  2009   lt fx  . ENDOSCOPIC TURBINATE REDUCTION Bilateral 10/06/2015   Procedure: ENDOSCOPIC TURBINATE REDUCTION;  Surgeon: Jerrell Belfast, MD;  Location: [facility];  Service: ENT;  Laterality: Bilateral;  . EYE SURGERY    . FRACTURE SURGERY Bilateral    wrist  . JOINT REPLACEMENT  2001   both-knees  . SINUS ENDO W/FUSION Left 10/06/2015   Procedure: LEFT ENDOSCOPIC SINUS SURGERY WITH MAXILLARY ANTROSTOMY AND REMOVAL OF DISEASED TISSUE ;  Surgeon: Jerrell Belfast, MD;  Location: [facility];  Service: ENT;  Laterality: Left;  . TOTAL KNEE ARTHROPLASTY     bilaterally    Social History   Social History  . Marital status: Widowed    Spouse name: N/A  . Number of children: 4  . Years of education: College   Occupational History  . Retired    Social History Main Topics  . Smoking status: Never Smoker  . Smokeless tobacco: Never Used  . Alcohol use 4.2 oz/week    7 Glasses of wine per week     Comment: Wine at dinner  . Drug use: No  . Sexual activity: Not Asked   Other Topics Concern  . None   Social History Narrative   Lives at home alone   Right-handed   Caffeine: 1-3 Coke/tea   Exercise: walking regularly    Family History  Problem Relation Age of Onset  . Breast cancer Mother   . Heart attack Mother        early 55s  . Osteoporosis Mother   . Diabetes Mother   . Heart attack Father 62  . Heart disease Sister        CABG in late 69s  . Cancer Sister         CERVICAL  . Diabetes Sister   . Diabetes Maternal Aunt        X2  . Diabetes Maternal Grandmother   . Stroke Neg Hx     Review of Systems  Constitutional: Negative for chills and fever.  HENT: Negative for hearing loss and tinnitus.   Eyes: Negative for visual disturbance.  Respiratory: Negative for cough, shortness of breath and  wheezing.   Cardiovascular: Negative for chest pain, palpitations and leg swelling.  Neurological: Negative for light-headedness and headaches.  Psychiatric/Behavioral: Negative for dysphoric mood. The patient is not nervous/anxious.        Objective:   Vitals:   06/11/17 1430  BP: 134/74  Pulse: 82  Resp: 16  Temp: 97.6 F (36.4 C)   Filed Weights   06/11/17 1430  Weight: 110 lb (49.9 kg)   Body mass index is 20.78 kg/m.  Wt Readings from Last 3 Encounters:  06/11/17 110 lb (49.9 kg)  01/29/17 110 lb (49.9 kg)  06/16/16 113 lb (51.3 kg)     Physical Exam Constitutional: Appears well-developed and well-nourished. No distress.  HENT:  Head: Normocephalic and atraumatic.  Neck: Neck supple. No tracheal deviation present. No thyromegaly present.  No cervical lymphadenopathy Cardiovascular: Normal rate, regular rhythm and normal heart sounds.   No murmur heard. No carotid bruit .  No edema Pulmonary/Chest: Effort normal and breath sounds normal. No respiratory distress. No has no wheezes. No rales.  Abdomen: soft, nontender, nondistended Skin: Skin is warm and dry. Not diaphoretic.  Psychiatric: Normal mood and affect. Behavior is normal.        Assessment & Plan:   Wellness Exam: Immunizations  Up to date, advised shingrix Colonoscopy no longer needed due to age 9 - no longer needed due to age Dexa - due, ordered Eye exam  Up to date  Hearing loss   none Memory concerns/difficulties   no Independent of ADLs   fullly Stressed the importance of regular exercise   Patient received copy of preventative screening  tests/immunizations recommended for the next 5-10 years.   See Problem List for Assessment and Plan of chronic medical problems.

## 2017-06-10 NOTE — Assessment & Plan Note (Addendum)
Check a1c, cmp, cbc Continue regular exercise Low sugar/carb diet

## 2017-06-11 ENCOUNTER — Other Ambulatory Visit (INDEPENDENT_AMBULATORY_CARE_PROVIDER_SITE_OTHER): Payer: Medicare Other

## 2017-06-11 ENCOUNTER — Ambulatory Visit (INDEPENDENT_AMBULATORY_CARE_PROVIDER_SITE_OTHER): Payer: Medicare Other | Admitting: Internal Medicine

## 2017-06-11 ENCOUNTER — Encounter: Payer: Self-pay | Admitting: Internal Medicine

## 2017-06-11 VITALS — BP 134/74 | HR 82 | Temp 97.6°F | Resp 16 | Ht 61.0 in | Wt 110.0 lb

## 2017-06-11 DIAGNOSIS — Z13820 Encounter for screening for osteoporosis: Secondary | ICD-10-CM

## 2017-06-11 DIAGNOSIS — Z0001 Encounter for general adult medical examination with abnormal findings: Secondary | ICD-10-CM

## 2017-06-11 DIAGNOSIS — R7303 Prediabetes: Secondary | ICD-10-CM

## 2017-06-11 DIAGNOSIS — E7800 Pure hypercholesterolemia, unspecified: Secondary | ICD-10-CM

## 2017-06-11 DIAGNOSIS — N3946 Mixed incontinence: Secondary | ICD-10-CM | POA: Insufficient documentation

## 2017-06-11 DIAGNOSIS — Z0000 Encounter for general adult medical examination without abnormal findings: Secondary | ICD-10-CM

## 2017-06-11 DIAGNOSIS — E2839 Other primary ovarian failure: Secondary | ICD-10-CM | POA: Diagnosis not present

## 2017-06-11 LAB — CBC WITH DIFFERENTIAL/PLATELET
Basophils Absolute: 0 10*3/uL (ref 0.0–0.1)
Basophils Relative: 0.9 % (ref 0.0–3.0)
Eosinophils Absolute: 0.1 10*3/uL (ref 0.0–0.7)
Eosinophils Relative: 1.6 % (ref 0.0–5.0)
HCT: 42.3 % (ref 36.0–46.0)
Hemoglobin: 14 g/dL (ref 12.0–15.0)
Lymphocytes Relative: 35.1 % (ref 12.0–46.0)
Lymphs Abs: 1.8 10*3/uL (ref 0.7–4.0)
MCHC: 33.2 g/dL (ref 30.0–36.0)
MCV: 91.3 fl (ref 78.0–100.0)
Monocytes Absolute: 0.5 10*3/uL (ref 0.1–1.0)
Monocytes Relative: 9.5 % (ref 3.0–12.0)
Neutro Abs: 2.7 10*3/uL (ref 1.4–7.7)
Neutrophils Relative %: 52.9 % (ref 43.0–77.0)
Platelets: 222 10*3/uL (ref 150.0–400.0)
RBC: 4.63 Mil/uL (ref 3.87–5.11)
RDW: 13.8 % (ref 11.5–15.5)
WBC: 5 10*3/uL (ref 4.0–10.5)

## 2017-06-11 LAB — COMPREHENSIVE METABOLIC PANEL
ALT: 16 U/L (ref 0–35)
AST: 23 U/L (ref 0–37)
Albumin: 4.3 g/dL (ref 3.5–5.2)
Alkaline Phosphatase: 93 U/L (ref 39–117)
BUN: 28 mg/dL — ABNORMAL HIGH (ref 6–23)
CO2: 28 mEq/L (ref 19–32)
Calcium: 9.7 mg/dL (ref 8.4–10.5)
Chloride: 103 mEq/L (ref 96–112)
Creatinine, Ser: 0.8 mg/dL (ref 0.40–1.20)
GFR: 72.47 mL/min (ref >60.00)
Glucose, Bld: 99 mg/dL (ref 70–99)
Potassium: 4.7 mEq/L (ref 3.5–5.1)
Sodium: 139 mEq/L (ref 135–145)
Total Bilirubin: 0.4 mg/dL (ref 0.2–1.2)
Total Protein: 7.3 g/dL (ref 6.0–8.3)

## 2017-06-11 LAB — HEMOGLOBIN A1C: Hgb A1c MFr Bld: 5.7 % (ref 4.6–6.5)

## 2017-06-11 LAB — LIPID PANEL
Cholesterol: 237 mg/dL — ABNORMAL HIGH (ref 0–200)
HDL: 89.5 mg/dL (ref >39.00)
LDL Cholesterol: 132 mg/dL — ABNORMAL HIGH (ref 0–99)
NonHDL: 147.53
Total CHOL/HDL Ratio: 3
Triglycerides: 76 mg/dL (ref 0.0–149.0)
VLDL: 15.2 mg/dL (ref 0.0–40.0)

## 2017-06-11 LAB — TSH: TSH: 3.95 u[IU]/mL (ref 0.35–4.50)

## 2017-06-11 MED ORDER — MIRABEGRON ER 25 MG PO TB24: 25.0000 mg | ORAL_TABLET | Freq: Every day | ORAL | 5 refills | Status: DC

## 2017-06-11 MED ORDER — ZOSTER VAC RECOMB ADJUVANTED 50 MCG/0.5ML IM SUSR: 0.5000 mL | Freq: Once | INTRAMUSCULAR | 1 refills | Status: AC

## 2017-06-11 NOTE — Assessment & Plan Note (Signed)
Has had for years, has gotten worse Wears pads Has done PT in past Has never tried medication Will try myrbetriq - discussed most common side effects - can increase if needed Call with questions

## 2017-07-11 ENCOUNTER — Ambulatory Visit (INDEPENDENT_AMBULATORY_CARE_PROVIDER_SITE_OTHER)
Admission: RE | Admit: 2017-07-11 | Discharge: 2017-07-11 | Disposition: A | Discharge status: Home / Self Care | Payer: Medicare Other | Source: Ambulatory Visit | Attending: Internal Medicine | Admitting: Internal Medicine

## 2017-07-11 DIAGNOSIS — E2839 Other primary ovarian failure: Secondary | ICD-10-CM

## 2017-07-11 DIAGNOSIS — Z13820 Encounter for screening for osteoporosis: Secondary | ICD-10-CM

## 2017-07-22 ENCOUNTER — Encounter: Payer: Self-pay | Admitting: Internal Medicine

## 2017-08-28 DIAGNOSIS — Z23 Encounter for immunization: Secondary | ICD-10-CM | POA: Diagnosis not present

## 2017-12-13 DIAGNOSIS — H26491 Other secondary cataract, right eye: Secondary | ICD-10-CM | POA: Diagnosis not present

## 2018-01-09 DIAGNOSIS — L821 Other seborrheic keratosis: Secondary | ICD-10-CM | POA: Diagnosis not present

## 2018-01-09 DIAGNOSIS — D692 Other nonthrombocytopenic purpura: Secondary | ICD-10-CM | POA: Diagnosis not present

## 2018-01-09 DIAGNOSIS — C44319 Basal cell carcinoma of skin of other parts of face: Secondary | ICD-10-CM | POA: Diagnosis not present

## 2018-01-09 DIAGNOSIS — L570 Actinic keratosis: Secondary | ICD-10-CM | POA: Diagnosis not present

## 2018-01-09 DIAGNOSIS — Z85828 Personal history of other malignant neoplasm of skin: Secondary | ICD-10-CM | POA: Diagnosis not present

## 2018-01-09 DIAGNOSIS — D485 Neoplasm of uncertain behavior of skin: Secondary | ICD-10-CM | POA: Diagnosis not present

## 2018-01-09 DIAGNOSIS — D1801 Hemangioma of skin and subcutaneous tissue: Secondary | ICD-10-CM | POA: Diagnosis not present

## 2018-02-26 DIAGNOSIS — Z85828 Personal history of other malignant neoplasm of skin: Secondary | ICD-10-CM | POA: Diagnosis not present

## 2018-02-26 DIAGNOSIS — C44319 Basal cell carcinoma of skin of other parts of face: Secondary | ICD-10-CM | POA: Diagnosis not present

## 2018-04-24 ENCOUNTER — Other Ambulatory Visit: Payer: Self-pay | Admitting: Obstetrics & Gynecology

## 2018-04-24 ENCOUNTER — Other Ambulatory Visit: Payer: Self-pay | Admitting: Internal Medicine

## 2018-04-24 DIAGNOSIS — Z1231 Encounter for screening mammogram for malignant neoplasm of breast: Secondary | ICD-10-CM

## 2018-05-31 ENCOUNTER — Ambulatory Visit
Admission: RE | Admit: 2018-05-31 | Discharge: 2018-05-31 | Disposition: A | Discharge status: Home / Self Care | Payer: Medicare Other | Source: Ambulatory Visit | Attending: Internal Medicine | Admitting: Internal Medicine

## 2018-05-31 DIAGNOSIS — Z1231 Encounter for screening mammogram for malignant neoplasm of breast: Secondary | ICD-10-CM | POA: Diagnosis not present

## 2018-06-11 NOTE — Progress Notes (Signed)
Subjective:    Patient ID: Danielle Mcgee, female    DOB: 1929/11/14, 84 y.o.   MRN: [id number]  HPI Here for medicare wellness exam and follow up of her chronic medical problems.   I have personally reviewed and have noted 1.The patient's medical and social history 2.Their use of alcohol, tobacco or illicit drugs 3.Their current medications and supplements 4.The patient's functional ability including ADL's, fall risks, home                 safety risk and hearing or visual impairment. 5.Diet and physical activities 6.Evidence for depression or mood disorders 7.Care team reviewed  -  Eye doctor - Dr Carla Drape, dermatology  Prediabetes:  She is compliant with a low sugar/carbohydrate diet.  She is not exercising regularly.  Hyperlipidemia: She is taking her medication daily. She is compliant with a low fat/cholesterol diet. She is not exercising regularly. She denies myalgias.   Urinary incontinence:  She has mixed urge and stress incontinence.  She has done PT in the past.  She wears pads daily.     Are there smokers in your home (other than you)? No  Risk Factors Exercise:  Active, no regimented exercise Dietary issues discussed:   She eats what she wants to eat  Vitamin and supplement use:   MVI, cal/vitamin d  Opiod use:   none Side effects from medication:    n/a Does medications benefits outweigh risks/side effects:     n/a  Cardiac risk factors: advanced age, hyperlipidemia  Depression Screen  Have you felt down, depressed or hopeless? No  Have you felt little interest or pleasure in doing things?  No  Activities of Daily Living In your present state of health, do you have any difficulty performing the following activities?:  Driving? No Managing money?  No Feeding yourself? No Getting from bed to chair? No Climbing a flight of stairs? No Preparing food and eating?: No Bathing or showering?  No Getting dressed: No Getting to/using the toilet? No Moving around from place to place: No In the past year have you fallen or had a near fall?: yes, tripped over her own shoes, no injuries   Are you sexually active?  No  Do you have more than one partner?  N/A  Hearing Difficulties: No Do you often ask people to speak up or repeat themselves? No Do you experience ringing or noises in your ears? No Do you have difficulty understanding soft or whispered voices? No Vision:              Any change in vision:   no             Up to date with eye exam:   yes  Memory:  Do you feel that you have a problem with memory? No  Do you often misplace items? No  Do you feel safe at home?  Yes  Cognitive Testing  Alert, Orientated? Yes  Normal Appearance? Yes  Recall of three objects?  Yes  Can perform simple calculations? Yes  Displays appropriate judgment? Yes  Can read the correct time from a watch face? Yes   Advanced Directives have been discussed with the patient? Yes     Medications and allergies reviewed with patient and updated if appropriate.  Patient Active Problem List   Diagnosis Date Noted  . Mixed stress and urge urinary incontinence 06/11/2017  . Prediabetes 06/10/2017  . Paresthesia and pain of right extremity 06/16/2016  . Mild  cardiomegaly 09/04/2015  . Chronic left maxillary sinusitis 07/08/2015  . Perennial allergic rhinitis 03/13/2014  . Temporal arteritis ([place]) 09/20/2011  . Hyperlipidemia 04/19/2009  . Osteopenia 04/19/2009    Current Outpatient Medications on File Prior to Visit  Medication Sig Dispense Refill  . aspirin EC 81 MG tablet Take 81 mg by mouth daily.    . Calcium Carbonate-Vitamin D (CALCIUM + D PO) Take 1,200 mg by mouth daily.      . Multiple Vitamins-Minerals (CENTRUM SILVER PO) Take 1 tablet by mouth daily.       No current facility-administered medications on file prior to visit.     Past Medical History:  Diagnosis Date  .  Benign breast disease 2000  . DJD (degenerative joint disease)   . Generalized headaches   . Heart murmur   . Hyperlipidemia   . Mitral click-murmur syndrome    nomitral  reguritation  . Osteopenia   . Temporal arteritis ([place])    2013-14    Past Surgical History:  Procedure Laterality Date  . APPENDECTOMY  1962  . ARTERY BIOPSY  09/20/2011   Procedure: BIOPSY TEMPORAL ARTERY;  Surgeon: Adin Hector, MD;  Location: [facility];  Service: General;  Laterality: Left;  . BREAST BIOPSY  2000   Atypical Lobular Hyperplasia  . CATARACT EXTRACTION     bilaterally  . COLONOSCOPY  2002 & 2013   Dr Olevia Perches  . ELBOW SURGERY  2009   lt fx  . ENDOSCOPIC TURBINATE REDUCTION Bilateral 10/06/2015   Procedure: ENDOSCOPIC TURBINATE REDUCTION;  Surgeon: Jerrell Belfast, MD;  Location: [facility];  Service: ENT;  Laterality: Bilateral;  . EYE SURGERY    . FRACTURE SURGERY Bilateral    wrist  . JOINT REPLACEMENT  2001   both-knees  . SINUS ENDO W/FUSION Left 10/06/2015   Procedure: LEFT ENDOSCOPIC SINUS SURGERY WITH MAXILLARY ANTROSTOMY AND REMOVAL OF DISEASED TISSUE ;  Surgeon: Jerrell Belfast, MD;  Location: [facility];  Service: ENT;  Laterality: Left;  . TOTAL KNEE ARTHROPLASTY     bilaterally    Social History   Socioeconomic History  . Marital status: Widowed    Spouse name: Not on file  . Number of children: 4  . Years of education: College  . Highest education level: Not on file  Occupational History  . Occupation: Retired  Scientific laboratory technician  . Financial resource strain: Not on file  . Food insecurity:    Worry: Not on file    Inability: Not on file  . Transportation needs:    Medical: Not on file    Non-medical: Not on file  Tobacco Use  . Smoking status: Never Smoker  . Smokeless tobacco: Never Used  Substance and Sexual Activity  . Alcohol use: Yes    Alcohol/week: 4.2 oz    Types: 7 Glasses of wine per week    Comment: Wine at dinner  . Drug use: No  . Sexual activity: Not on  file  Lifestyle  . Physical activity:    Days per week: Not on file    Minutes per session: Not on file  . Stress: Not on file  Relationships  . Social connections:    Talks on phone: Not on file    Gets together: Not on file    Attends religious service: Not on file    Active member of club or organization: Not on file    Attends meetings of clubs or organizations: Not on file    Relationship  status: Not on file  Other Topics Concern  . Not on file  Social History Narrative   Lives at home alone   Right-handed   Caffeine: 1-3 Coke/tea   Exercise: walking regularly    Family History  Problem Relation Age of Onset  . Breast cancer Mother   . Heart attack Mother        early 45s  . Osteoporosis Mother   . Diabetes Mother   . Heart attack Father 73  . Heart disease Sister        CABG in late 70s  . Cancer Sister        CERVICAL  . Diabetes Sister   . Diabetes Maternal Aunt        X2  . Diabetes Maternal Grandmother   . Stroke Neg Hx     Review of Systems  Constitutional: Negative for chills, fatigue and fever.  HENT: Negative for hearing loss and tinnitus.   Eyes: Negative for visual disturbance.  Respiratory: Negative for cough, shortness of breath and wheezing.   Cardiovascular: Negative for chest pain, palpitations and leg swelling.  Gastrointestinal: Negative for abdominal pain and nausea.  Skin: Negative for color change and rash.  Neurological: Negative for light-headedness and headaches.  Psychiatric/Behavioral: Negative for dysphoric mood and sleep disturbance. The patient is not nervous/anxious.        Objective:   Vitals:   06/12/18 1407  BP: 130/60  Pulse: 84  Resp: 16  Temp: 97.6 F (36.4 C)  SpO2: 97%   BP Readings from Last 3 Encounters:  06/12/18 130/60  06/11/17 134/74  01/29/17 (!) 152/76   Wt Readings from Last 3 Encounters:  06/12/18 113 lb (51.3 kg)  06/11/17 110 lb (49.9 kg)  01/29/17 110 lb (49.9 kg)   Body mass index is  21.35 kg/m.   Physical Exam    Constitutional: Appears well-developed and well-nourished. No distress.  HENT:  Head: Normocephalic and atraumatic.  Neck: Neck supple. No tracheal deviation present. No thyromegaly present.  No cervical lymphadenopathy Cardiovascular: Normal rate, regular rhythm and normal heart sounds.   No murmur heard. No carotid bruit .  No edema Pulmonary/Chest: Effort normal and breath sounds normal. No respiratory distress. No has no wheezes. No rales.  Skin: Skin is warm and dry. Not diaphoretic.  Psychiatric: Normal mood and affect. Behavior is normal.      Assessment & Plan:    Wellness Exam: Immunizations  - discussed shingles vaccine, other up to date Colonoscopy - no longer needed Mammogram  - no longer needed Dexa  Up to date - repeat next year Eye exam  Up to date Hearing loss   none Memory concerns/difficulties   none Independent of ADLs  Fully independent Stressed the importance of regular exercise - it would help her bones   Patient received copy of preventative screening tests/immunizations recommended for the next 5-10 years.   See Problem List for Assessment and Plan of chronic medical problems.

## 2018-06-12 ENCOUNTER — Other Ambulatory Visit (INDEPENDENT_AMBULATORY_CARE_PROVIDER_SITE_OTHER): Payer: Medicare Other

## 2018-06-12 ENCOUNTER — Ambulatory Visit (INDEPENDENT_AMBULATORY_CARE_PROVIDER_SITE_OTHER): Payer: Medicare Other | Admitting: Internal Medicine

## 2018-06-12 ENCOUNTER — Encounter: Payer: Self-pay | Admitting: Internal Medicine

## 2018-06-12 VITALS — BP 130/60 | HR 84 | Temp 97.6°F | Resp 16 | Wt 113.0 lb

## 2018-06-12 DIAGNOSIS — E782 Mixed hyperlipidemia: Secondary | ICD-10-CM

## 2018-06-12 DIAGNOSIS — M85851 Other specified disorders of bone density and structure, right thigh: Secondary | ICD-10-CM | POA: Diagnosis not present

## 2018-06-12 DIAGNOSIS — R7303 Prediabetes: Secondary | ICD-10-CM

## 2018-06-12 DIAGNOSIS — N3946 Mixed incontinence: Secondary | ICD-10-CM | POA: Diagnosis not present

## 2018-06-12 DIAGNOSIS — M85852 Other specified disorders of bone density and structure, left thigh: Secondary | ICD-10-CM | POA: Diagnosis not present

## 2018-06-12 DIAGNOSIS — M316 Other giant cell arteritis: Secondary | ICD-10-CM | POA: Diagnosis not present

## 2018-06-12 DIAGNOSIS — Z0000 Encounter for general adult medical examination without abnormal findings: Secondary | ICD-10-CM

## 2018-06-12 LAB — COMPREHENSIVE METABOLIC PANEL
ALT: 15 U/L (ref 0–35)
AST: 20 U/L (ref 0–37)
Albumin: 4 g/dL (ref 3.5–5.2)
Alkaline Phosphatase: 93 U/L (ref 39–117)
BUN: 28 mg/dL — ABNORMAL HIGH (ref 6–23)
CO2: 30 mEq/L (ref 19–32)
Calcium: 9.3 mg/dL (ref 8.4–10.5)
Chloride: 103 mEq/L (ref 96–112)
Creatinine, Ser: 0.68 mg/dL (ref 0.40–1.20)
GFR: 87.22 mL/min (ref >60.00)
Glucose, Bld: 94 mg/dL (ref 70–99)
Potassium: 4.5 mEq/L (ref 3.5–5.1)
Sodium: 138 mEq/L (ref 135–145)
Total Bilirubin: 0.4 mg/dL (ref 0.2–1.2)
Total Protein: 6.9 g/dL (ref 6.0–8.3)

## 2018-06-12 LAB — CBC WITH DIFFERENTIAL/PLATELET
Basophils Absolute: 0 10*3/uL (ref 0.0–0.1)
Basophils Relative: 0.8 % (ref 0.0–3.0)
Eosinophils Absolute: 0.1 10*3/uL (ref 0.0–0.7)
Eosinophils Relative: 2.6 % (ref 0.0–5.0)
HCT: 39.1 % (ref 36.0–46.0)
Hemoglobin: 13.2 g/dL (ref 12.0–15.0)
Lymphocytes Relative: 31.5 % (ref 12.0–46.0)
Lymphs Abs: 1.5 10*3/uL (ref 0.7–4.0)
MCHC: 33.8 g/dL (ref 30.0–36.0)
MCV: 89.4 fl (ref 78.0–100.0)
Monocytes Absolute: 0.6 10*3/uL (ref 0.1–1.0)
Monocytes Relative: 12.5 % — ABNORMAL HIGH (ref 3.0–12.0)
Neutro Abs: 2.5 10*3/uL (ref 1.4–7.7)
Neutrophils Relative %: 52.6 % (ref 43.0–77.0)
Platelets: 243 10*3/uL (ref 150.0–400.0)
RBC: 4.37 Mil/uL (ref 3.87–5.11)
RDW: 13 % (ref 11.5–15.5)
WBC: 4.7 10*3/uL (ref 4.0–10.5)

## 2018-06-12 LAB — LIPID PANEL
Cholesterol: 202 mg/dL — ABNORMAL HIGH (ref 0–200)
HDL: 71.8 mg/dL (ref >39.00)
LDL Cholesterol: 105 mg/dL — ABNORMAL HIGH (ref 0–99)
NonHDL: 129.7
Total CHOL/HDL Ratio: 3
Triglycerides: 123 mg/dL (ref 0.0–149.0)
VLDL: 24.6 mg/dL (ref 0.0–40.0)

## 2018-06-12 LAB — HEMOGLOBIN A1C: Hgb A1c MFr Bld: 5.8 % (ref 4.6–6.5)

## 2018-06-12 LAB — TSH: TSH: 4.18 u[IU]/mL (ref 0.35–4.50)

## 2018-06-12 NOTE — Patient Instructions (Addendum)
Ms. Danielle Mcgee , Thank you for taking time to come for your Medicare Wellness Visit. I appreciate your ongoing commitment to your health goals. Please review the following plan we discussed and let me know if I can assist you in the future.   These are the goals we discussed: Goals    None      This is a list of the screening recommended for you and due dates:  Health Maintenance  Topic Date Due  . Flu Shot  06/06/2018  . DEXA scan (bone density measurement)  07/12/2019  . Tetanus Vaccine  09/02/2025  . Pneumonia vaccines  Completed     Test(s) ordered today. Your results will be released to [facility] (or called to you) after review, usually within 72hours after test completion. If any changes need to be made, you will be notified at that same time.  All other Health Maintenance issues reviewed.   All recommended immunizations and age-appropriate screenings are up-to-date or discussed.  No immunizations administered today.   Medications reviewed and updated.  No changes recommended at this time.   Please followup in one year    Health Maintenance, Female Adopting a healthy lifestyle and getting preventive care can go a long way to promote health and wellness. Talk with your health care provider about what schedule of regular examinations is right for you. This is a good chance for you to check in with your provider about disease prevention and staying healthy. In between checkups, there are plenty of things you can do on your own. Experts have done a lot of research about which lifestyle changes and preventive measures are most likely to keep you healthy. Ask your health care provider for more information. Weight and diet Eat a healthy diet  Be sure to include plenty of vegetables, fruits, low-fat dairy products, and lean protein.  Do not eat a lot of foods high in solid fats, added sugars, or salt.  Get regular exercise. This is one of the most important things you can do for  your health. ? Most adults should exercise for at least 150 minutes each week. The exercise should increase your heart rate and make you sweat (moderate-intensity exercise). ? Most adults should also do strengthening exercises at least twice a week. This is in addition to the moderate-intensity exercise.  Maintain a healthy weight  Body mass index (BMI) is a measurement that can be used to identify possible weight problems. It estimates body fat based on height and weight. Your health care provider can help determine your BMI and help you achieve or maintain a healthy weight.  For females 53 years of age and older: ? A BMI below 18.5 is considered underweight. ? A BMI of 18.5 to 24.9 is normal. ? A BMI of 25 to 29.9 is considered overweight. ? A BMI of 30 and above is considered obese.  Watch levels of cholesterol and blood lipids  You should start having your blood tested for lipids and cholesterol at 82 years of age, then have this test every 5 years.  You may need to have your cholesterol levels checked more often if: ? Your lipid or cholesterol levels are high. ? You are older than 82 years of age. ? You are at high risk for heart disease.  Cancer screening Lung Cancer  Lung cancer screening is recommended for adults 71-40 years old who are at high risk for lung cancer because of a history of smoking.  A yearly low-dose CT scan  of the lungs is recommended for people who: ? Currently smoke. ? Have quit within the past 15 years. ? Have at least a 30-pack-year history of smoking. A pack year is smoking an average of one pack of cigarettes a day for 1 year.  Yearly screening should continue until it has been 15 years since you quit.  Yearly screening should stop if you develop a health problem that would prevent you from having lung cancer treatment.  Breast Cancer  Practice breast self-awareness. This means understanding how your breasts normally appear and feel.  It also  means doing regular breast self-exams. Let your health care provider know about any changes, no matter how small.  If you are in your 20s or 30s, you should have a clinical breast exam (CBE) by a health care provider every 1-3 years as part of a regular health exam.  If you are 71 or older, have a CBE every year. Also consider having a breast X-ray (mammogram) every year.  If you have a family history of breast cancer, talk to your health care provider about genetic screening.  If you are at high risk for breast cancer, talk to your health care provider about having an MRI and a mammogram every year.  Breast cancer gene (BRCA) assessment is recommended for women who have family members with BRCA-related cancers. BRCA-related cancers include: ? Breast. ? Ovarian. ? Tubal. ? Peritoneal cancers.  Results of the assessment will determine the need for genetic counseling and BRCA1 and BRCA2 testing.  Cervical Cancer Your health care provider may recommend that you be screened regularly for cancer of the pelvic organs (ovaries, uterus, and vagina). This screening involves a pelvic examination, including checking for microscopic changes to the surface of your cervix (Pap test). You may be encouraged to have this screening done every 3 years, beginning at age 11.  For women ages 33-65, health care providers may recommend pelvic exams and Pap testing every 3 years, or they may recommend the Pap and pelvic exam, combined with testing for human papilloma virus (HPV), every 5 years. Some types of HPV increase your risk of cervical cancer. Testing for HPV may also be done on women of any age with unclear Pap test results.  Other health care providers may not recommend any screening for nonpregnant women who are considered low risk for pelvic cancer and who do not have symptoms. Ask your health care provider if a screening pelvic exam is right for you.  If you have had past treatment for cervical cancer or  a condition that could lead to cancer, you need Pap tests and screening for cancer for at least 20 years after your treatment. If Pap tests have been discontinued, your risk factors (such as having a new sexual partner) need to be reassessed to determine if screening should resume. Some women have medical problems that increase the chance of getting cervical cancer. In these cases, your health care provider may recommend more frequent screening and Pap tests.  Colorectal Cancer  This type of cancer can be detected and often prevented.  Routine colorectal cancer screening usually begins at 82 years of age and continues through 82 years of age.  Your health care provider may recommend screening at an earlier age if you have risk factors for colon cancer.  Your health care provider may also recommend using home test kits to check for hidden blood in the stool.  A small camera at the end of a tube can be  used to examine your colon directly (sigmoidoscopy or colonoscopy). This is done to check for the earliest forms of colorectal cancer.  Routine screening usually begins at age 3.  Direct examination of the colon should be repeated every 5-10 years through 82 years of age. However, you may need to be screened more often if early forms of precancerous polyps or small growths are found.  Skin Cancer  Check your skin from head to toe regularly.  Tell your health care provider about any new moles or changes in moles, especially if there is a change in a mole's shape or color.  Also tell your health care provider if you have a mole that is larger than the size of a pencil eraser.  Always use sunscreen. Apply sunscreen liberally and repeatedly throughout the day.  Protect yourself by wearing long sleeves, pants, a wide-brimmed hat, and sunglasses whenever you are outside.  Heart disease, diabetes, and high blood pressure  High blood pressure causes heart disease and increases the risk of  stroke. High blood pressure is more likely to develop in: ? People who have blood pressure in the high end of the normal range (130-139/85-89 mm Hg). ? People who are overweight or obese. ? People who are African American.  If you are 74-80 years of age, have your blood pressure checked every 3-5 years. If you are 35 years of age or older, have your blood pressure checked every year. You should have your blood pressure measured twice-once when you are at a hospital or clinic, and once when you are not at a hospital or clinic. Record the average of the two measurements. To check your blood pressure when you are not at a hospital or clinic, you can use: ? An automated blood pressure machine at a pharmacy. ? A home blood pressure monitor.  If you are between 73 years and 30 years old, ask your health care provider if you should take aspirin to prevent strokes.  Have regular diabetes screenings. This involves taking a blood sample to check your fasting blood sugar level. ? If you are at a normal weight and have a low risk for diabetes, have this test once every three years after 82 years of age. ? If you are overweight and have a high risk for diabetes, consider being tested at a younger age or more often. Preventing infection Hepatitis B  If you have a higher risk for hepatitis B, you should be screened for this virus. You are considered at high risk for hepatitis B if: ? You were born in a country where hepatitis B is common. Ask your health care provider which countries are considered high risk. ? Your parents were born in a high-risk country, and you have not been immunized against hepatitis B (hepatitis B vaccine). ? You have HIV or AIDS. ? You use needles to inject street drugs. ? You live with someone who has hepatitis B. ? You have had sex with someone who has hepatitis B. ? You get hemodialysis treatment. ? You take certain medicines for conditions, including cancer, organ  transplantation, and autoimmune conditions.  Hepatitis C  Blood testing is recommended for: ? Everyone born from [birth year] through 1963. ? Anyone with known risk factors for hepatitis C.  Sexually transmitted infections (STIs)  You should be screened for sexually transmitted infections (STIs) including gonorrhea and chlamydia if: ? You are sexually active and are younger than 82 years of age. ? You are older than 82 years of  age and your health care provider tells you that you are at risk for this type of infection. ? Your sexual activity has changed since you were last screened and you are at an increased risk for chlamydia or gonorrhea. Ask your health care provider if you are at risk.  If you do not have HIV, but are at risk, it may be recommended that you take a prescription medicine daily to prevent HIV infection. This is called pre-exposure prophylaxis (PrEP). You are considered at risk if: ? You are sexually active and do not regularly use condoms or know the HIV status of your partner(s). ? You take drugs by injection. ? You are sexually active with a partner who has HIV.  Talk with your health care provider about whether you are at high risk of being infected with HIV. If you choose to begin PrEP, you should first be tested for HIV. You should then be tested every 3 months for as long as you are taking PrEP. Pregnancy  If you are premenopausal and you may become pregnant, ask your health care provider about preconception counseling.  If you may become pregnant, take 400 to 800 micrograms (mcg) of folic acid every day.  If you want to prevent pregnancy, talk to your health care provider about birth control (contraception). Osteoporosis and menopause  Osteoporosis is a disease in which the bones lose minerals and strength with aging. This can result in serious bone fractures. Your risk for osteoporosis can be identified using a bone density scan.  If you are 80 years of age or  older, or if you are at risk for osteoporosis and fractures, ask your health care provider if you should be screened.  Ask your health care provider whether you should take a calcium or vitamin D supplement to lower your risk for osteoporosis.  Menopause may have certain physical symptoms and risks.  Hormone replacement therapy may reduce some of these symptoms and risks. Talk to your health care provider about whether hormone replacement therapy is right for you. Follow these instructions at home:  Schedule regular health, dental, and eye exams.  Stay current with your immunizations.  Do not use any tobacco products including cigarettes, chewing tobacco, or electronic cigarettes.  If you are pregnant, do not drink alcohol.  If you are breastfeeding, limit how much and how often you drink alcohol.  Limit alcohol intake to no more than 1 drink per day for nonpregnant women. One drink equals 12 ounces of beer, 5 ounces of wine, or 1 ounces of hard liquor.  Do not use street drugs.  Do not share needles.  Ask your health care provider for help if you need support or information about quitting drugs.  Tell your health care provider if you often feel depressed.  Tell your health care provider if you have ever been abused or do not feel safe at home. This information is not intended to replace advice given to you by your health care provider. Make sure you discuss any questions you have with your health care provider. Document Released: 05/08/2011 Document Revised: 03/30/2016 Document Reviewed: 07/27/2015 Elsevier Interactive Patient Education  [employer].

## 2018-06-12 NOTE — Assessment & Plan Note (Signed)
Check a1c Low sugar / carb diet Stressed regular exercise   

## 2018-06-12 NOTE — Assessment & Plan Note (Signed)
Severe osteopenia with high frax Taking cal/vitamin d Very active, no regular exercise Deferred any treatment dexa due next year

## 2018-06-12 NOTE — Assessment & Plan Note (Signed)
Check lipid, tsh, cmp Not on medication Controlled with lifestyle

## 2018-06-12 NOTE — Assessment & Plan Note (Signed)
Not on medication Wears pads daily Deferred medication

## 2018-06-12 NOTE — Assessment & Plan Note (Signed)
No evidence of recurrence 

## 2018-07-02 DIAGNOSIS — Z23 Encounter for immunization: Secondary | ICD-10-CM | POA: Diagnosis not present

## 2018-07-13 DIAGNOSIS — L03113 Cellulitis of right upper limb: Secondary | ICD-10-CM | POA: Diagnosis not present

## 2018-08-13 DIAGNOSIS — S66317A Strain of extensor muscle, fascia and tendon of left little finger at wrist and hand level, initial encounter: Secondary | ICD-10-CM | POA: Diagnosis not present

## 2018-08-20 DIAGNOSIS — M79642 Pain in left hand: Secondary | ICD-10-CM | POA: Diagnosis not present

## 2018-08-21 DIAGNOSIS — S66317A Strain of extensor muscle, fascia and tendon of left little finger at wrist and hand level, initial encounter: Secondary | ICD-10-CM | POA: Diagnosis not present

## 2018-08-23 DIAGNOSIS — M66242 Spontaneous rupture of extensor tendons, left hand: Secondary | ICD-10-CM | POA: Diagnosis not present

## 2018-08-23 DIAGNOSIS — M66232 Spontaneous rupture of extensor tendons, left forearm: Secondary | ICD-10-CM | POA: Diagnosis not present

## 2018-08-29 DIAGNOSIS — M66242 Spontaneous rupture of extensor tendons, left hand: Secondary | ICD-10-CM | POA: Diagnosis not present

## 2018-09-05 DIAGNOSIS — S66317A Strain of extensor muscle, fascia and tendon of left little finger at wrist and hand level, initial encounter: Secondary | ICD-10-CM | POA: Diagnosis not present

## 2018-09-09 DIAGNOSIS — M66242 Spontaneous rupture of extensor tendons, left hand: Secondary | ICD-10-CM | POA: Diagnosis not present

## 2018-09-12 DIAGNOSIS — M66242 Spontaneous rupture of extensor tendons, left hand: Secondary | ICD-10-CM | POA: Diagnosis not present

## 2018-09-19 DIAGNOSIS — M66242 Spontaneous rupture of extensor tendons, left hand: Secondary | ICD-10-CM | POA: Diagnosis not present

## 2018-09-24 DIAGNOSIS — M66242 Spontaneous rupture of extensor tendons, left hand: Secondary | ICD-10-CM | POA: Diagnosis not present

## 2018-09-26 DIAGNOSIS — M66242 Spontaneous rupture of extensor tendons, left hand: Secondary | ICD-10-CM | POA: Diagnosis not present

## 2018-09-30 DIAGNOSIS — M66242 Spontaneous rupture of extensor tendons, left hand: Secondary | ICD-10-CM | POA: Diagnosis not present

## 2018-10-07 DIAGNOSIS — M66242 Spontaneous rupture of extensor tendons, left hand: Secondary | ICD-10-CM | POA: Diagnosis not present

## 2018-10-08 DIAGNOSIS — S66317A Strain of extensor muscle, fascia and tendon of left little finger at wrist and hand level, initial encounter: Secondary | ICD-10-CM | POA: Diagnosis not present

## 2018-10-16 DIAGNOSIS — M66242 Spontaneous rupture of extensor tendons, left hand: Secondary | ICD-10-CM | POA: Diagnosis not present

## 2018-10-21 DIAGNOSIS — M66242 Spontaneous rupture of extensor tendons, left hand: Secondary | ICD-10-CM | POA: Diagnosis not present

## 2018-12-10 DIAGNOSIS — S66317D Strain of extensor muscle, fascia and tendon of left little finger at wrist and hand level, subsequent encounter: Secondary | ICD-10-CM | POA: Diagnosis not present

## 2018-12-27 DIAGNOSIS — H26493 Other secondary cataract, bilateral: Secondary | ICD-10-CM | POA: Diagnosis not present

## 2019-04-01 DIAGNOSIS — D692 Other nonthrombocytopenic purpura: Secondary | ICD-10-CM | POA: Diagnosis not present

## 2019-04-01 DIAGNOSIS — L821 Other seborrheic keratosis: Secondary | ICD-10-CM | POA: Diagnosis not present

## 2019-04-01 DIAGNOSIS — Z85828 Personal history of other malignant neoplasm of skin: Secondary | ICD-10-CM | POA: Diagnosis not present

## 2019-04-01 DIAGNOSIS — D1801 Hemangioma of skin and subcutaneous tissue: Secondary | ICD-10-CM | POA: Diagnosis not present

## 2019-04-18 ENCOUNTER — Other Ambulatory Visit: Payer: Self-pay | Admitting: Internal Medicine

## 2019-04-18 ENCOUNTER — Other Ambulatory Visit: Payer: Self-pay

## 2019-04-18 DIAGNOSIS — Z1231 Encounter for screening mammogram for malignant neoplasm of breast: Secondary | ICD-10-CM

## 2019-04-30 ENCOUNTER — Telehealth: Payer: Self-pay | Admitting: *Deleted

## 2019-04-30 NOTE — Telephone Encounter (Signed)
Called patient in response to Danielle Mcgee received that she would like to schedule an AWV. Nurse will call patient back at a later date to schedule.

## 2019-05-14 NOTE — Telephone Encounter (Signed)
Called and LVM regarding scheduling an AWV. Nurse requested that the patient call her back to schedule and nurse's contact number was provided.

## 2019-06-06 ENCOUNTER — Ambulatory Visit
Admission: RE | Admit: 2019-06-06 | Discharge: 2019-06-06 | Disposition: A | Discharge status: Home / Self Care | Payer: Medicare Other | Source: Ambulatory Visit | Attending: Internal Medicine | Admitting: Internal Medicine

## 2019-06-06 ENCOUNTER — Other Ambulatory Visit: Payer: Self-pay

## 2019-06-06 DIAGNOSIS — Z1231 Encounter for screening mammogram for malignant neoplasm of breast: Secondary | ICD-10-CM

## 2019-06-14 NOTE — Progress Notes (Signed)
Subjective:    Patient ID: Danielle Mcgee, female    DOB: 12/21/1929, 85 y.o.   MRN: [id number]  HPI The patient is here for follow up.  Overall she is doing well and denies any changes over the last year.  She has no concerns.  She feels well.  She is exercising regularly - walks a couple of miles weekly.    Prediabetes:  She is compliant with a low sugar/carbohydrate diet.  She is exercising regularly.  Osteopenia:  Her dexa is up to date, but due next month.  She is exercising-walks daily.  She takes calcium and vitamin d daily.    Hyperlipidemia:  She is controlling her cholesterol with lifestyle.  She is compliant with a low fat/cholesterol diet.    Toenail onychomycosis: She has toenail fungus and wonders if there is anything that can help.  Her left first toenail fell off and her right first toenail is loose and looks like it may fall off.    Medications and allergies reviewed with patient and updated if appropriate.  Patient Active Problem List   Diagnosis Date Noted  . Mixed stress and urge urinary incontinence 06/11/2017  . Prediabetes 06/10/2017  . Paresthesia and pain of right extremity 06/16/2016  . Mild cardiomegaly 09/04/2015  . Chronic left maxillary sinusitis 07/08/2015  . Perennial allergic rhinitis 03/13/2014  . Temporal arteritis ([place]) 09/20/2011  . Hyperlipidemia 04/19/2009  . Osteopenia 04/19/2009    Current Outpatient Medications on File Prior to Visit  Medication Sig Dispense Refill  . aspirin EC 81 MG tablet Take 81 mg by mouth daily.    . Calcium Carbonate-Vitamin D (CALCIUM + D PO) Take 1,200 mg by mouth daily.      . Multiple Vitamins-Minerals (CENTRUM SILVER PO) Take 1 tablet by mouth daily.       No current facility-administered medications on file prior to visit.     Past Medical History:  Diagnosis Date  . Benign breast disease 2000  . DJD (degenerative joint disease)   . Generalized headaches   . Heart murmur   . Hyperlipidemia    . Mitral click-murmur syndrome    nomitral  reguritation  . Osteopenia   . Temporal arteritis ([place])    2013-14    Past Surgical History:  Procedure Laterality Date  . APPENDECTOMY  1962  . ARTERY BIOPSY  09/20/2011   Procedure: BIOPSY TEMPORAL ARTERY;  Surgeon: Adin Hector, MD;  Location: [facility];  Service: General;  Laterality: Left;  . BREAST BIOPSY  2000   Atypical Lobular Hyperplasia  . CATARACT EXTRACTION     bilaterally  . COLONOSCOPY  2002 & 2013   Dr Olevia Perches  . ELBOW SURGERY  2009   lt fx  . ENDOSCOPIC TURBINATE REDUCTION Bilateral 10/06/2015   Procedure: ENDOSCOPIC TURBINATE REDUCTION;  Surgeon: Jerrell Belfast, MD;  Location: [facility];  Service: ENT;  Laterality: Bilateral;  . EYE SURGERY    . FRACTURE SURGERY Bilateral    wrist  . JOINT REPLACEMENT  2001   both-knees  . SINUS ENDO W/FUSION Left 10/06/2015   Procedure: LEFT ENDOSCOPIC SINUS SURGERY WITH MAXILLARY ANTROSTOMY AND REMOVAL OF DISEASED TISSUE ;  Surgeon: Jerrell Belfast, MD;  Location: [facility];  Service: ENT;  Laterality: Left;  . TOTAL KNEE ARTHROPLASTY     bilaterally    Social History   Socioeconomic History  . Marital status: Widowed    Spouse name: Not on file  . Number of children: 4  .  Years of education: College  . Highest education level: Not on file  Occupational History  . Occupation: Retired  Scientific laboratory technician  . Financial resource strain: Not on file  . Food insecurity    Worry: Not on file    Inability: Not on file  . Transportation needs    Medical: Not on file    Non-medical: Not on file  Tobacco Use  . Smoking status: Never Smoker  . Smokeless tobacco: Never Used  Substance and Sexual Activity  . Alcohol use: Yes    Alcohol/week: 7.0 standard drinks    Types: 7 Glasses of wine per week    Comment: Wine at dinner  . Drug use: No  . Sexual activity: Not on file  Lifestyle  . Physical activity    Days per week: Not on file    Minutes per session: Not on file  . Stress:  Not on file  Relationships  . Social Herbalist on phone: Not on file    Gets together: Not on file    Attends religious service: Not on file    Active member of club or organization: Not on file    Attends meetings of clubs or organizations: Not on file    Relationship status: Not on file  Other Topics Concern  . Not on file  Social History Narrative   Lives at home alone   Right-handed   Caffeine: 1-3 Coke/tea   Exercise: walking regularly    Family History  Problem Relation Age of Onset  . Breast cancer Mother   . Heart attack Mother        early 30s  . Osteoporosis Mother   . Diabetes Mother   . Heart attack Father 65  . Heart disease Sister        CABG in late 75s  . Cancer Sister        CERVICAL  . Diabetes Sister   . Diabetes Maternal Aunt        X2  . Diabetes Maternal Grandmother   . Stroke Neg Hx     Review of Systems  Constitutional: Negative for chills, fatigue and fever.  Respiratory: Negative for cough, shortness of breath and wheezing.   Cardiovascular: Negative for chest pain, palpitations and leg swelling.  Gastrointestinal: Negative for abdominal pain, blood in stool, constipation, diarrhea and nausea.  Neurological: Negative for light-headedness and headaches.       Objective:   Vitals:   06/16/19 1323  BP: 134/62  Pulse: 96  Resp: 16  Temp: 97.9 F (36.6 C)  SpO2: 98%   BP Readings from Last 3 Encounters:  06/16/19 134/62  06/12/18 130/60  06/11/17 134/74   Wt Readings from Last 3 Encounters:  06/16/19 110 lb (49.9 kg)  06/12/18 113 lb (51.3 kg)  06/11/17 110 lb (49.9 kg)   Body mass index is 20.78 kg/m.   Physical Exam    Constitutional: Appears well-developed and well-nourished. No distress.  HENT:  Head: Normocephalic and atraumatic.  Neck: Neck supple. No tracheal deviation present. No thyromegaly present.  No cervical lymphadenopathy Cardiovascular: Normal rate, regular rhythm and normal heart sounds.   No  murmur heard. No carotid bruit .  No edema Pulmonary/Chest: Effort normal and breath sounds normal. No respiratory distress. No has no wheezes. No rales. Abdomen: Soft, nontender, nondistended Skin: Skin is warm and dry. Not diaphoretic.  Psychiatric: Normal mood and affect. Behavior is normal.      Assessment & Plan:  Follow-up in 1 year, sooner if needed  See Problem List for Assessment and Plan of chronic medical problems.

## 2019-06-14 NOTE — Patient Instructions (Addendum)
  Tests ordered today. Your results will be released to [facility] (or called to you) after review.  If any changes need to be made, you will be notified at that same time.  A bone density test was ordered.   Medications reviewed and updated.  Changes include :   Topical toe nail fungus gel   Please followup in 1 year, sooner if needed

## 2019-06-16 ENCOUNTER — Encounter: Payer: Self-pay | Admitting: Internal Medicine

## 2019-06-16 ENCOUNTER — Other Ambulatory Visit: Payer: Self-pay

## 2019-06-16 ENCOUNTER — Inpatient Hospital Stay: Admission: RE | Admit: 2019-06-16 | Payer: Medicare Other | Source: Ambulatory Visit

## 2019-06-16 ENCOUNTER — Ambulatory Visit (INDEPENDENT_AMBULATORY_CARE_PROVIDER_SITE_OTHER): Payer: Medicare Other | Admitting: Internal Medicine

## 2019-06-16 ENCOUNTER — Other Ambulatory Visit (INDEPENDENT_AMBULATORY_CARE_PROVIDER_SITE_OTHER): Payer: Medicare Other

## 2019-06-16 VITALS — BP 134/62 | HR 96 | Temp 97.9°F | Resp 16 | Ht 61.0 in | Wt 110.0 lb

## 2019-06-16 DIAGNOSIS — E782 Mixed hyperlipidemia: Secondary | ICD-10-CM

## 2019-06-16 DIAGNOSIS — M85852 Other specified disorders of bone density and structure, left thigh: Secondary | ICD-10-CM

## 2019-06-16 DIAGNOSIS — R7303 Prediabetes: Secondary | ICD-10-CM | POA: Diagnosis not present

## 2019-06-16 DIAGNOSIS — M85851 Other specified disorders of bone density and structure, right thigh: Secondary | ICD-10-CM

## 2019-06-16 DIAGNOSIS — B351 Tinea unguium: Secondary | ICD-10-CM | POA: Diagnosis not present

## 2019-06-16 DIAGNOSIS — Z23 Encounter for immunization: Secondary | ICD-10-CM | POA: Diagnosis not present

## 2019-06-16 LAB — CBC WITH DIFFERENTIAL/PLATELET
Basophils Absolute: 0 10*3/uL (ref 0.0–0.1)
Basophils Relative: 0.8 % (ref 0.0–3.0)
Eosinophils Absolute: 0.1 10*3/uL (ref 0.0–0.7)
Eosinophils Relative: 1.4 % (ref 0.0–5.0)
HCT: 38.5 % (ref 36.0–46.0)
Hemoglobin: 12.8 g/dL (ref 12.0–15.0)
Lymphocytes Relative: 30.6 % (ref 12.0–46.0)
Lymphs Abs: 1.6 10*3/uL (ref 0.7–4.0)
MCHC: 33.3 g/dL (ref 30.0–36.0)
MCV: 90.7 fl (ref 78.0–100.0)
Monocytes Absolute: 0.5 10*3/uL (ref 0.1–1.0)
Monocytes Relative: 9.6 % (ref 3.0–12.0)
Neutro Abs: 2.9 10*3/uL (ref 1.4–7.7)
Neutrophils Relative %: 57.6 % (ref 43.0–77.0)
Platelets: 192 10*3/uL (ref 150.0–400.0)
RBC: 4.25 Mil/uL (ref 3.87–5.11)
RDW: 13.3 % (ref 11.5–15.5)
WBC: 5.1 10*3/uL (ref 4.0–10.5)

## 2019-06-16 LAB — COMPREHENSIVE METABOLIC PANEL
ALT: 18 U/L (ref 0–35)
AST: 24 U/L (ref 0–37)
Albumin: 4.2 g/dL (ref 3.5–5.2)
Alkaline Phosphatase: 88 U/L (ref 39–117)
BUN: 29 mg/dL — ABNORMAL HIGH (ref 6–23)
CO2: 26 mEq/L (ref 19–32)
Calcium: 9.5 mg/dL (ref 8.4–10.5)
Chloride: 103 mEq/L (ref 96–112)
Creatinine, Ser: 0.73 mg/dL (ref 0.40–1.20)
GFR: 75.43 mL/min (ref >60.00)
Glucose, Bld: 123 mg/dL — ABNORMAL HIGH (ref 70–99)
Potassium: 4.5 mEq/L (ref 3.5–5.1)
Sodium: 138 mEq/L (ref 135–145)
Total Bilirubin: 0.4 mg/dL (ref 0.2–1.2)
Total Protein: 6.7 g/dL (ref 6.0–8.3)

## 2019-06-16 LAB — LIPID PANEL
Cholesterol: 211 mg/dL — ABNORMAL HIGH (ref 0–200)
HDL: 72.5 mg/dL (ref >39.00)
LDL Cholesterol: 122 mg/dL — ABNORMAL HIGH (ref 0–99)
NonHDL: 138.69
Total CHOL/HDL Ratio: 3
Triglycerides: 82 mg/dL (ref 0.0–149.0)
VLDL: 16.4 mg/dL (ref 0.0–40.0)

## 2019-06-16 LAB — VITAMIN D 25 HYDROXY (VIT D DEFICIENCY, FRACTURES): VITD: 55.37 ng/mL (ref 30.00–100.00)

## 2019-06-16 LAB — TSH: TSH: 4.63 u[IU]/mL — ABNORMAL HIGH (ref 0.35–4.50)

## 2019-06-16 MED ORDER — CICLOPIROX 8 % EX SOLN: Freq: Every day | CUTANEOUS | 5 refills | Status: DC

## 2019-06-16 NOTE — Assessment & Plan Note (Signed)
Bilateral onychomycosis of toenails Trial of ciclopirox topical gel

## 2019-06-16 NOTE — Assessment & Plan Note (Signed)
Diet controlled Check lipid panel, cmp, tsh Regular exercise and healthy diet encouraged

## 2019-06-16 NOTE — Assessment & Plan Note (Signed)
Check a1c Low sugar / carb diet Stressed regular exercise   

## 2019-06-16 NOTE — Assessment & Plan Note (Addendum)
dexa due - ordered Taking calcium and vitamin d daily Walking daily

## 2019-07-02 ENCOUNTER — Telehealth: Payer: Self-pay | Admitting: Internal Medicine

## 2019-07-02 NOTE — Telephone Encounter (Signed)
Spoke with pt about her AVS and she stated that it said prediabetes was discussed at her visit. She said "nothing has ever been discussed about prediabetes ever and I would like that taken off my chart" I advised pt that she has been considered prediabetic the last 5 years she has had her A1c checked. She still stated that it has never been discussed with her and she wanted it off her chart. I told her I would relay the message.

## 2019-07-02 NOTE — Telephone Encounter (Signed)
Spoke with Danielle Mcgee today. She has some questions about her AVS she received on 06/16/19. She would like for you to give her a call.

## 2019-07-02 NOTE — Telephone Encounter (Signed)
Patient declined AWV at this time. SF °

## 2019-07-03 ENCOUNTER — Encounter: Payer: Self-pay | Admitting: Internal Medicine

## 2019-07-21 ENCOUNTER — Ambulatory Visit (INDEPENDENT_AMBULATORY_CARE_PROVIDER_SITE_OTHER)
Admission: RE | Admit: 2019-07-21 | Discharge: 2019-07-21 | Disposition: A | Discharge status: Home / Self Care | Payer: Medicare Other | Source: Ambulatory Visit | Attending: Internal Medicine | Admitting: Internal Medicine

## 2019-07-21 ENCOUNTER — Other Ambulatory Visit: Payer: Self-pay

## 2019-07-21 DIAGNOSIS — M85852 Other specified disorders of bone density and structure, left thigh: Secondary | ICD-10-CM

## 2019-07-21 DIAGNOSIS — M85851 Other specified disorders of bone density and structure, right thigh: Secondary | ICD-10-CM

## 2019-07-24 ENCOUNTER — Encounter: Payer: Self-pay | Admitting: Internal Medicine

## 2019-07-27 NOTE — Progress Notes (Signed)
Subjective:    Patient ID: Danielle Mcgee, female    DOB: 1929/12/28, 85 y.o.   MRN: [id number]  HPI The patient is here for follow up of her recent dexa that showed osteoporosis.    Her dexa was done 07/21/19 :  Results:  Lumbar spine L1-L4 Femoral neck (FN)  T-score   -2.3 RFN: -2.2 LFN: - 2.4  Change in BMD from previous DXA test (%)  2018 Down 3.2* Down 3.1*  (*) statistically significant  Assessment: Patient has a CLINICAL Diagnosis of  OSTEOPOROSIS ( Based on Hx of fragility fracture)   FRAX 10-year fracture risk calculator: 20% for any major fracture and 7.2 % for hip fracture.   She is taking calcium and vitamin d daily.  She walks a couple of miles 5 days a week.  She states her balance is good.  She has been on Boniva in the past for ? 5 years.  She did not have any side effects.     She really does not want to take medication but wanted to review her last dexa and see what my recommendations were.    Medications and allergies reviewed with patient and updated if appropriate.  Patient Active Problem List   Diagnosis Date Noted  . Onychomycosis of toenail 06/16/2019  . Mixed stress and urge urinary incontinence 06/11/2017  . Hyperglycemia 06/10/2017  . Paresthesia and pain of right extremity 06/16/2016  . Mild cardiomegaly 09/04/2015  . Chronic left maxillary sinusitis 07/08/2015  . Perennial allergic rhinitis 03/13/2014  . Temporal arteritis ([place]) 09/20/2011  . Hyperlipidemia 04/19/2009  . Osteoporosis 04/19/2009    Current Outpatient Medications on File Prior to Visit  Medication Sig Dispense Refill  . aspirin EC 81 MG tablet Take 81 mg by mouth daily.    . Calcium Carbonate-Vitamin D (CALCIUM + D PO) Take 1,200 mg by mouth daily.      . ciclopirox (PENLAC) 8 % solution Apply topically at bedtime. Apply over nail and surrounding skin. Apply daily over previous coat. After seven (7) days, may remove with alcohol and continue cycle. 6.6 mL 5  .  Multiple Vitamins-Minerals (CENTRUM SILVER PO) Take 1 tablet by mouth daily.       No current facility-administered medications on file prior to visit.     Past Medical History:  Diagnosis Date  . Benign breast disease 2000  . DJD (degenerative joint disease)   . Generalized headaches   . Heart murmur   . Hyperlipidemia   . Mitral click-murmur syndrome    nomitral  reguritation  . Osteopenia   . Temporal arteritis ([place])    2013-14    Past Surgical History:  Procedure Laterality Date  . APPENDECTOMY  1962  . ARTERY BIOPSY  09/20/2011   Procedure: BIOPSY TEMPORAL ARTERY;  Surgeon: Adin Hector, MD;  Location: [facility];  Service: General;  Laterality: Left;  . BREAST BIOPSY  2000   Atypical Lobular Hyperplasia  . CATARACT EXTRACTION     bilaterally  . COLONOSCOPY  2002 & 2013   Dr Olevia Perches  . ELBOW SURGERY  2009   lt fx  . ENDOSCOPIC TURBINATE REDUCTION Bilateral 10/06/2015   Procedure: ENDOSCOPIC TURBINATE REDUCTION;  Surgeon: Jerrell Belfast, MD;  Location: [facility];  Service: ENT;  Laterality: Bilateral;  . EYE SURGERY    . FRACTURE SURGERY Bilateral    wrist  . JOINT REPLACEMENT  2001   both-knees  . SINUS ENDO W/FUSION Left 10/06/2015   Procedure:  LEFT ENDOSCOPIC SINUS SURGERY WITH MAXILLARY ANTROSTOMY AND REMOVAL OF DISEASED TISSUE ;  Surgeon: Jerrell Belfast, MD;  Location: [facility];  Service: ENT;  Laterality: Left;  . TOTAL KNEE ARTHROPLASTY     bilaterally    Social History   Socioeconomic History  . Marital status: Widowed    Spouse name: Not on file  . Number of children: 4  . Years of education: College  . Highest education level: Not on file  Occupational History  . Occupation: Retired  Scientific laboratory technician  . Financial resource strain: Not on file  . Food insecurity    Worry: Not on file    Inability: Not on file  . Transportation needs    Medical: Not on file    Non-medical: Not on file  Tobacco Use  . Smoking status: Never Smoker  . Smokeless tobacco:  Never Used  Substance and Sexual Activity  . Alcohol use: Yes    Alcohol/week: 7.0 standard drinks    Types: 7 Glasses of wine per week    Comment: Wine at dinner  . Drug use: No  . Sexual activity: Not on file  Lifestyle  . Physical activity    Days per week: Not on file    Minutes per session: Not on file  . Stress: Not on file  Relationships  . Social Herbalist on phone: Not on file    Gets together: Not on file    Attends religious service: Not on file    Active member of club or organization: Not on file    Attends meetings of clubs or organizations: Not on file    Relationship status: Not on file  Other Topics Concern  . Not on file  Social History Narrative   Lives at home alone   Right-handed   Caffeine: 1-3 Coke/tea   Exercise: walking regularly    Family History  Problem Relation Age of Onset  . Breast cancer Mother   . Heart attack Mother        early 50s  . Osteoporosis Mother   . Diabetes Mother   . Heart attack Father 6  . Heart disease Sister        CABG in late 38s  . Cancer Sister        CERVICAL  . Diabetes Sister   . Diabetes Maternal Aunt        X2  . Diabetes Maternal Grandmother   . Stroke Neg Hx     Review of Systems     Objective:   Vitals:   07/28/19 1253  BP: 134/72  Pulse: 64  Resp: 16  Temp: 97.6 F (36.4 C)  SpO2: 99%   BP Readings from Last 3 Encounters:  07/28/19 134/72  06/16/19 134/62  06/12/18 130/60   Wt Readings from Last 3 Encounters:  07/28/19 108 lb (49 kg)  06/16/19 110 lb (49.9 kg)  06/12/18 113 lb (51.3 kg)   Body mass index is 20.41 kg/m.   Physical Exam Constitutional:      Appearance: Normal appearance.  Skin:    General: Skin is warm and dry.  Neurological:     Mental Status: She is alert.  Psychiatric:        Mood and Affect: Mood normal.        Behavior: Behavior normal.            Assessment & Plan:    15 minutes were spent face-to-face with the patient, over  50% of which was spent counseling regarding her severe osteopenia with a high FRAX, including causes of decreased bone density and risk of fracture.  We discussed treatment with calcium/vitamin d, exercise and medication options including fosamax, prolia, reclast and forteo.     See Problem List for Assessment and Plan of chronic medical problems.

## 2019-07-27 NOTE — Patient Instructions (Addendum)
You should continue the calcium and vitamin d daily.  1200 mg of calcium, 1000-2000 units of vitamin d daily.    Continue regular walking. Continue to work on your balance and try to prevent falls.     You can consider taking medication for your bones at any time.     Osteoporosis  Osteoporosis is thinning and loss of density in your bones. Osteoporosis makes bones more brittle and fragile and more likely to break (fracture). Over time, osteoporosis can cause your bones to become so weak that they fracture after a minor fall. Bones in the hip, wrist, and spine are most likely to fracture due to osteoporosis. What are the causes? The exact cause of this condition is not known. What increases the risk? You may be at greater risk for osteoporosis if you:  Have a family history of the condition.  Have poor nutrition.  Use steroid medicines, such as prednisone.  Are female.  Are age 83 or older.  Smoke or have a history of smoking.  Are not physically active (are sedentary).  Are white (Caucasian) or of Asian descent.  Have a small body frame.  Take certain medicines, such as antiseizure medicines. What are the signs or symptoms? A fracture might be the first sign of osteoporosis, especially if the fracture results from a fall or injury that usually would not cause a bone to break. Other signs and symptoms include:  Pain in the neck or low back.  Stooped posture.  Loss of height. How is this diagnosed? This condition may be diagnosed based on:  Your medical history.  A physical exam.  A bone mineral density test, also called a DXA or DEXA test (dual-energy X-ray absorptiometry test). This test uses X-rays to measure the amount of minerals in your bones. How is this treated? The goal of treatment is to strengthen your bones and lower your risk for a fracture. Treatment may involve:  Making lifestyle changes, such as: ? Including foods with more calcium and vitamin D  in your diet. ? Doing weight-bearing and muscle-strengthening exercises. ? Stopping tobacco use. ? Limiting alcohol intake.  Taking medicine to slow the process of bone loss or to increase bone density.  Taking daily supplements of calcium and vitamin D.  Taking hormone replacement medicines, such as estrogen for women and testosterone for men.  Monitoring your levels of calcium and vitamin D. Follow these instructions at home:  Activity  Exercise as told by your health care provider. Ask your health care provider what exercises and activities are safe for you. You should do: ? Exercises that make you work against gravity (weight-bearing exercises), such as tai chi, yoga, or walking. ? Exercises to strengthen muscles, such as lifting weights. Lifestyle  Limit alcohol intake to no more than 1 drink a day for nonpregnant women and 2 drinks a day for men. One drink equals 12 oz of beer, 5 oz of wine, or 1 oz of hard liquor.  Do not use any products that contain nicotine or tobacco, such as cigarettes and e-cigarettes. If you need help quitting, ask your health care provider. Preventing falls  Use devices to help you move around (mobility aids) as needed, such as canes, walkers, scooters, or crutches.  Keep rooms well-lit and clutter-free.  Remove tripping hazards from walkways, including cords and throw rugs.  Install grab bars in bathrooms and safety rails on stairs.  Use rubber mats in the bathroom and other areas that are often wet or  slippery.  Wear closed-toe shoes that fit well and support your feet. Wear shoes that have rubber soles or low heels.  Review your medicines with your health care provider. Some medicines can cause dizziness or changes in blood pressure, which can increase your risk of falling. General instructions  Include calcium and vitamin D in your diet. Calcium is important for bone health, and vitamin D helps your body to absorb calcium. Good sources of  calcium and vitamin D include: ? Certain fatty fish, such as salmon and tuna. ? Products that have calcium and vitamin D added to them (fortified products), such as fortified cereals. ? Egg yolks. ? Cheese. ? Liver.  Take over-the-counter and prescription medicines only as told by your health care provider.  Keep all follow-up visits as told by your health care provider. This is important. Contact a health care provider if:  You have never been screened for osteoporosis and you are: ? A woman who is age 46 or older. ? A man who is age 35 or older. Get help right away if:  You fall or injure yourself. Summary  Osteoporosis is thinning and loss of density in your bones. This makes bones more brittle and fragile and more likely to break (fracture),even with minor falls.  The goal of treatment is to strengthen your bones and reduce your risk for a fracture.  Include calcium and vitamin D in your diet. Calcium is important for bone health, and vitamin D helps your body to absorb calcium.  Talk with your health care provider about screening for osteoporosis if you are a woman who is age 87 or older, or a man who is age 3 or older. This information is not intended to replace advice given to you by your health care provider. Make sure you discuss any questions you have with your health care provider. Document Released: 08/02/2005 Document Revised: 10/05/2017 Document Reviewed: 08/17/2017 Elsevier Patient Education  2020 [employer].

## 2019-07-28 ENCOUNTER — Other Ambulatory Visit: Payer: Self-pay

## 2019-07-28 ENCOUNTER — Ambulatory Visit (INDEPENDENT_AMBULATORY_CARE_PROVIDER_SITE_OTHER): Payer: Medicare Other | Admitting: Internal Medicine

## 2019-07-28 ENCOUNTER — Encounter: Payer: Self-pay | Admitting: Internal Medicine

## 2019-07-28 VITALS — BP 134/72 | HR 64 | Temp 97.6°F | Resp 16 | Ht 61.0 in | Wt 108.0 lb

## 2019-07-28 DIAGNOSIS — M810 Age-related osteoporosis without current pathological fracture: Secondary | ICD-10-CM | POA: Diagnosis not present

## 2019-07-28 NOTE — Assessment & Plan Note (Signed)
Reviewed dexa  Discussed calcium and vitamin d, exercise, balance Discussed medication options  She is not interested in medication at this time vitamin d level last month very good  Will recheck dexa in 2 yrs

## 2019-09-27 IMAGING — MG DIGITAL SCREENING BILATERAL MAMMOGRAM WITH TOMO AND CAD
8 series · 9 of 24 positions shown · non-contrast
Comparison: Previous exam(s).

CLINICAL DATA: Screening.

EXAM:
DIGITAL SCREENING BILATERAL MAMMOGRAM WITH TOMO AND CAD

[L CC synth-2D]
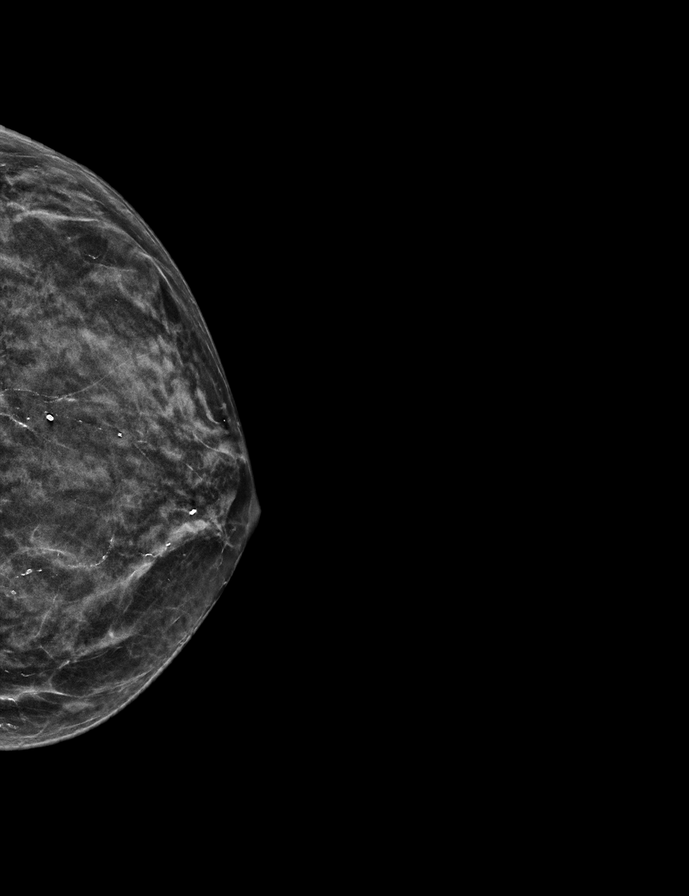

[R MLO synth-2D]
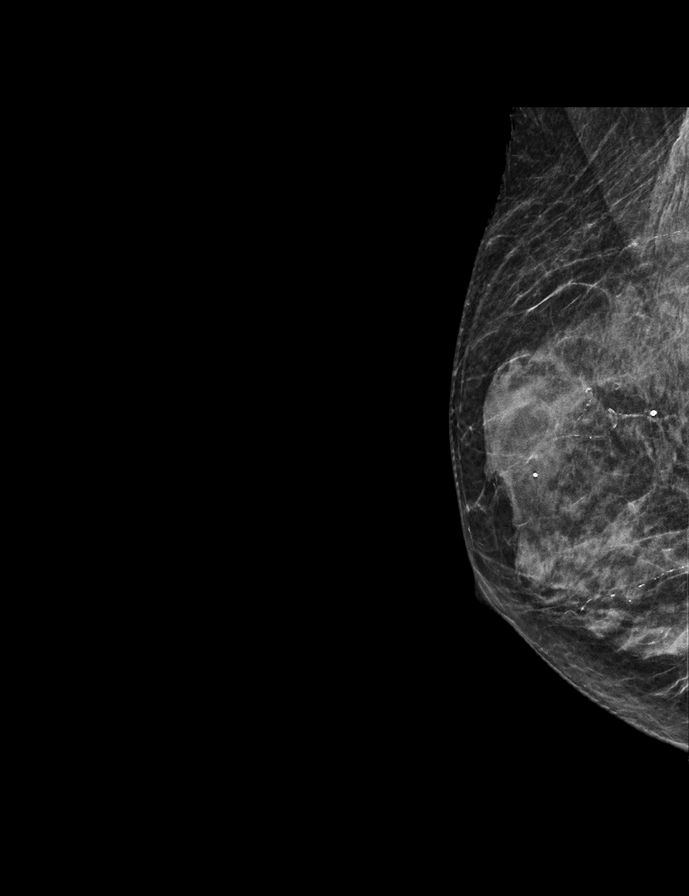

[R CC synth-2D]
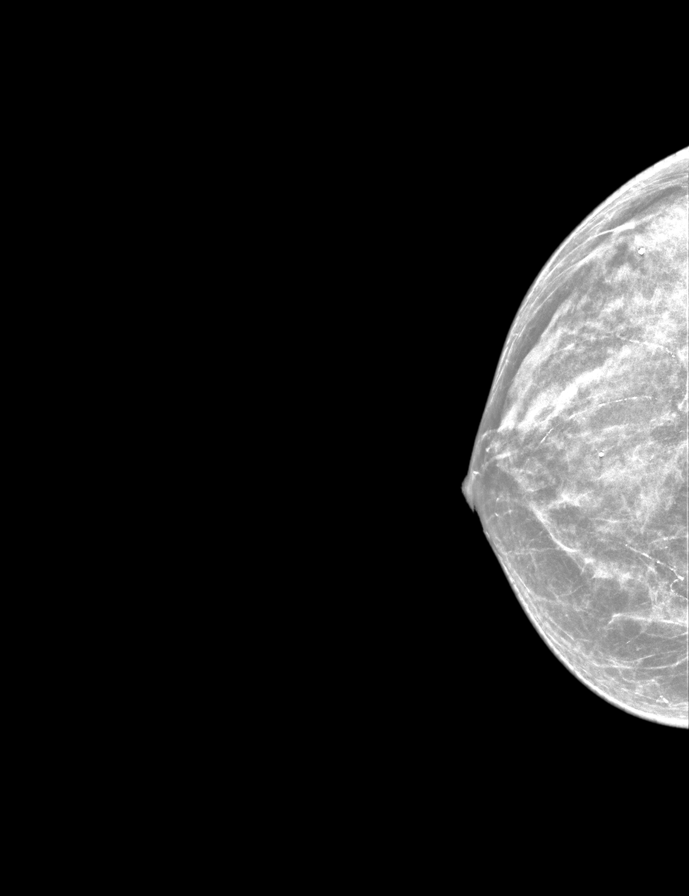

[L MLO synth-2D]
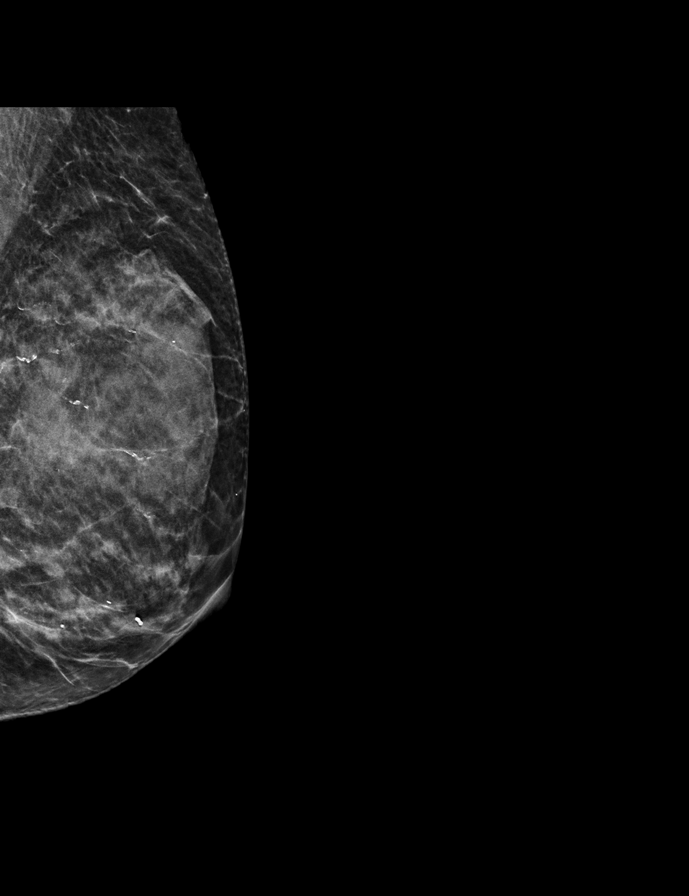

[L MLO tomo · 2 of 45 frames shown]
[frame 15/45]
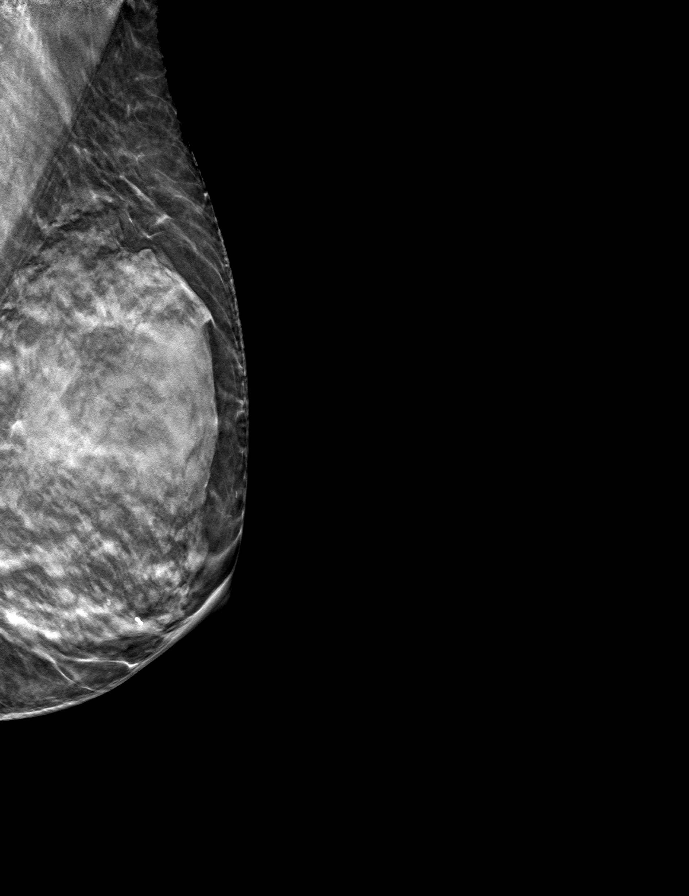
[frame 23/45]
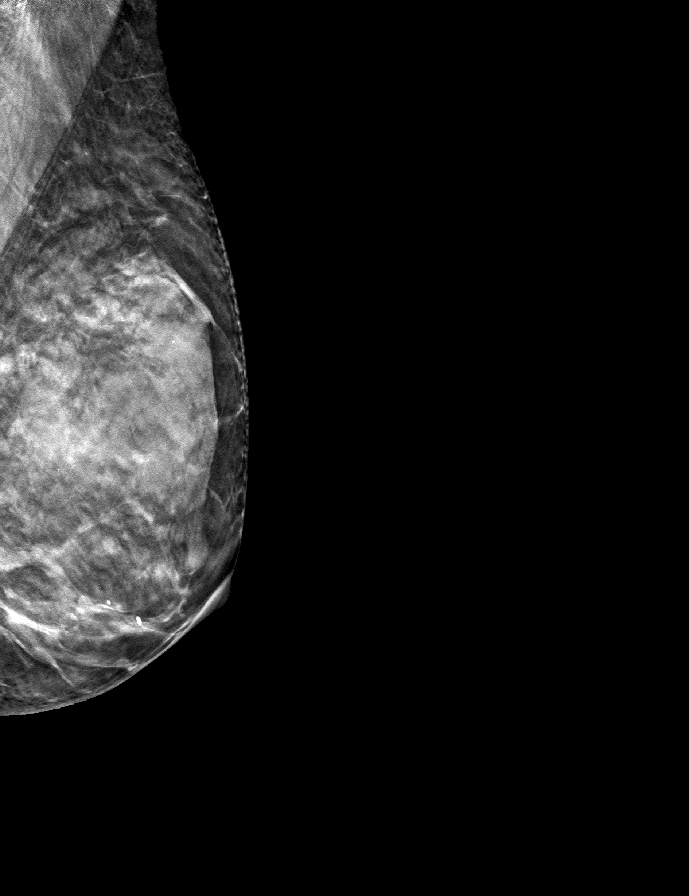

[L CC tomo · tomo slice 22/43.0]
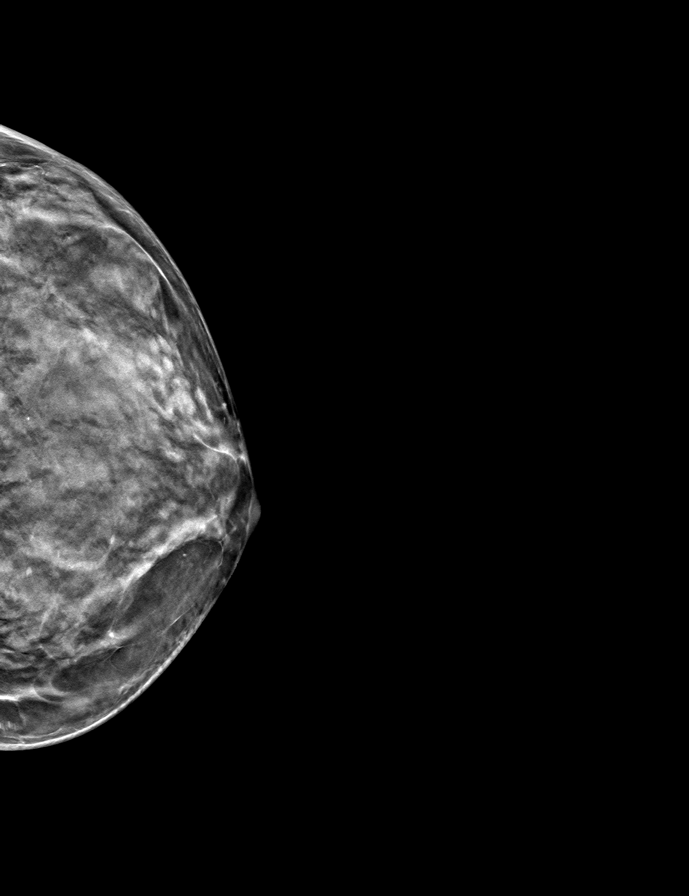

[R MLO tomo · tomo slice 22/43.0]
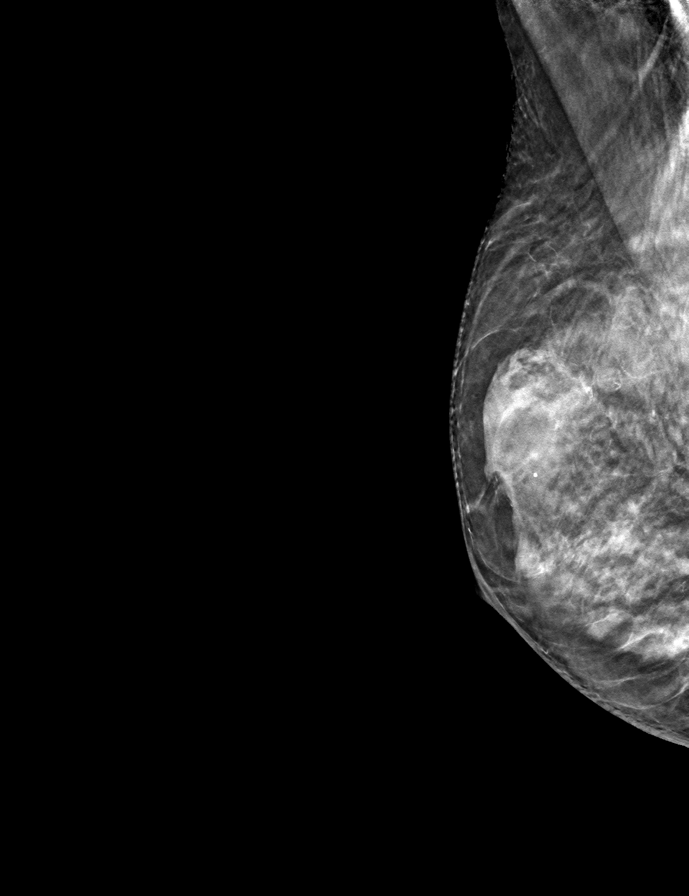

[R CC tomo · tomo slice 22/43.0]
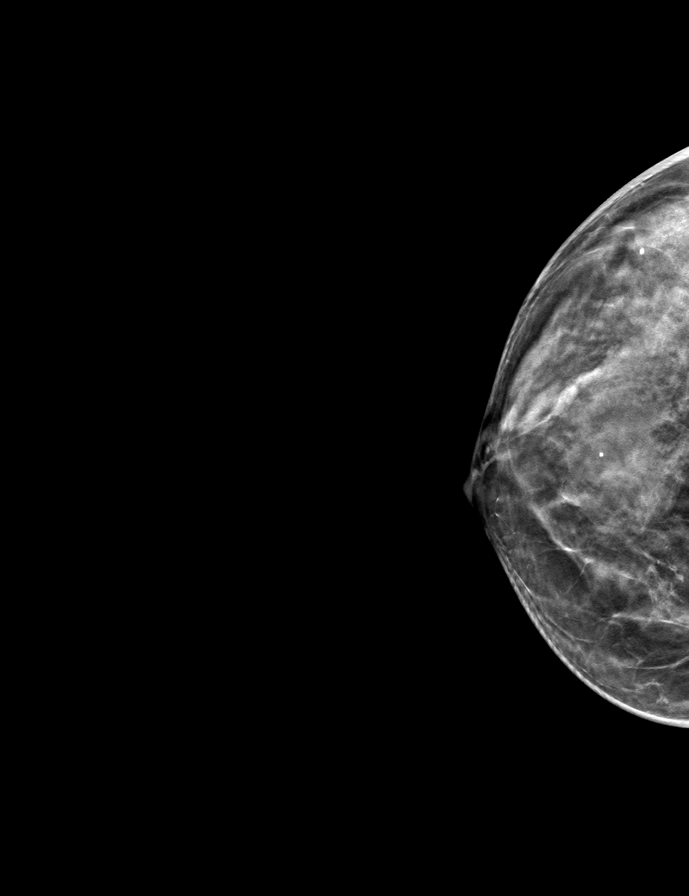

[9 of 24 positions shown; findings below may reference images not displayed]

ACR Breast Density Category c: The breast tissue is heterogeneously
dense, which may obscure small masses.
FINDINGS: There are no findings suspicious for malignancy. Images were
processed with CAD.
IMPRESSION: No mammographic evidence of malignancy. A result letter of this
screening mammogram will be mailed directly to the patient.

RECOMMENDATION:
Screening mammogram in one year. (Code:FT-U-LHB)

BI-RADS CATEGORY  1: Negative.

## 2019-11-26 ENCOUNTER — Ambulatory Visit: Payer: Medicare Other | Attending: Internal Medicine

## 2019-11-26 DIAGNOSIS — Z23 Encounter for immunization: Secondary | ICD-10-CM | POA: Insufficient documentation

## 2019-11-26 NOTE — Progress Notes (Signed)
   Covid-19 Vaccination Clinic  Name:  Railee Kesecker    MRN: [id number] DOB: [birth year]  11/26/2019  Ms. Schwisow was observed post Covid-19 immunization for 15 minutes without incidence. She was provided with Vaccine Information Sheet and instruction to access the V-Safe system.   Ms. Hornbuckle was instructed to call 911 with any severe reactions post vaccine: Marland Kitchen Difficulty breathing  . Swelling of your face and throat  . A fast heartbeat  . A bad rash all over your body  . Dizziness and weakness    Immunizations Administered    Name Date Dose VIS Date Route   Pfizer COVID-19 Vaccine 11/26/2019  8:56 AM 0.3 mL 10/17/2019 Intramuscular   Manufacturer: [place]   Lot: [id number]   [place]: [id number]

## 2019-12-17 ENCOUNTER — Ambulatory Visit: Payer: Medicare Other | Attending: Internal Medicine

## 2019-12-17 DIAGNOSIS — Z23 Encounter for immunization: Secondary | ICD-10-CM

## 2019-12-17 NOTE — Progress Notes (Signed)
   Covid-19 Vaccination Clinic  Name:  Danielle Mcgee    MRN: [id number] DOB: 04-03-1930  12/17/2019  Danielle Mcgee was observed post Covid-19 immunization for 15 minutes without incidence. She was provided with Vaccine Information Sheet and instruction to access the V-Safe system.   Danielle Mcgee was instructed to call 911 with any severe reactions post vaccine: Marland Kitchen Difficulty breathing  . Swelling of your face and throat  . A fast heartbeat  . A bad rash all over your body  . Dizziness and weakness    Immunizations Administered    Name Date Dose VIS Date Route   Pfizer COVID-19 Vaccine 12/17/2019 11:05 AM 0.3 mL 10/17/2019 Intramuscular   Manufacturer: [place]   Lot: [id number]   [place]: [id number]

## 2020-03-16 ENCOUNTER — Encounter: Payer: Self-pay | Admitting: Internal Medicine

## 2020-04-29 ENCOUNTER — Encounter: Payer: Self-pay | Admitting: Internal Medicine

## 2020-05-13 ENCOUNTER — Other Ambulatory Visit: Payer: Self-pay | Admitting: Internal Medicine

## 2020-05-13 DIAGNOSIS — Z1231 Encounter for screening mammogram for malignant neoplasm of breast: Secondary | ICD-10-CM

## 2020-06-15 ENCOUNTER — Telehealth (INDEPENDENT_AMBULATORY_CARE_PROVIDER_SITE_OTHER): Payer: Medicare Other | Admitting: Family

## 2020-06-15 DIAGNOSIS — J209 Acute bronchitis, unspecified: Secondary | ICD-10-CM | POA: Diagnosis not present

## 2020-06-15 MED ORDER — AZITHROMYCIN 250 MG PO TABS: ORAL_TABLET | ORAL | 0 refills | Status: DC

## 2020-06-15 MED ORDER — BENZONATATE 100 MG PO CAPS
100.0000 mg | ORAL_CAPSULE | Freq: Three times a day (TID) | ORAL | 0 refills | Status: DC | PRN
Start: 2020-06-15 — End: 2021-01-12

## 2020-06-15 NOTE — Progress Notes (Signed)
Danielle Mcgee is a 86 y.o. female with the following history as recorded in [employer]:  Patient Active Problem List   Diagnosis Date Noted  . Onychomycosis of toenail 06/16/2019  . Mixed stress and urge urinary incontinence 06/11/2017  . Hyperglycemia 06/10/2017  . Paresthesia and pain of right extremity 06/16/2016  . Mild cardiomegaly 09/04/2015  . Chronic left maxillary sinusitis 07/08/2015  . Perennial allergic rhinitis 03/13/2014  . Temporal arteritis ([place]) 09/20/2011  . Hyperlipidemia 04/19/2009  . Osteoporosis 04/19/2009    Current Outpatient Medications  Medication Sig Dispense Refill  . aspirin EC 81 MG tablet Take 81 mg by mouth daily.    Marland Kitchen azithromycin (ZITHROMAX) 250 MG tablet 2 tabs po qd x 1 day; 1 tablet per day x 4 days; 6 tablet 0  . benzonatate (TESSALON) 100 MG capsule Take 1 capsule (100 mg total) by mouth 3 (three) times daily as needed. 20 capsule 0  . Calcium Carbonate-Vitamin D (CALCIUM + D PO) Take 1,200 mg by mouth daily.      . Multiple Vitamins-Minerals (CENTRUM SILVER PO) Take 1 tablet by mouth daily.       No current facility-administered medications for this visit.    Allergies: Codeine  Past Medical History:  Diagnosis Date  . Benign breast disease 2000  . DJD (degenerative joint disease)   . Generalized headaches   . Heart murmur   . Hyperlipidemia   . Mitral click-murmur syndrome    nomitral  reguritation  . Osteopenia   . Temporal arteritis ([place])    2013-14    Past Surgical History:  Procedure Laterality Date  . APPENDECTOMY  1962  . ARTERY BIOPSY  09/20/2011   Procedure: BIOPSY TEMPORAL ARTERY;  Surgeon: Adin Hector, MD;  Location: [facility];  Service: General;  Laterality: Left;  . BREAST BIOPSY  2000   Atypical Lobular Hyperplasia  . CATARACT EXTRACTION     bilaterally  . COLONOSCOPY  2002 & 2013   Dr Olevia Perches  . ELBOW SURGERY  2009   lt fx  . ENDOSCOPIC TURBINATE REDUCTION Bilateral 10/06/2015   Procedure: ENDOSCOPIC  TURBINATE REDUCTION;  Surgeon: Jerrell Belfast, MD;  Location: [facility];  Service: ENT;  Laterality: Bilateral;  . EYE SURGERY    . FRACTURE SURGERY Bilateral    wrist  . JOINT REPLACEMENT  2001   both-knees  . SINUS ENDO W/FUSION Left 10/06/2015   Procedure: LEFT ENDOSCOPIC SINUS SURGERY WITH MAXILLARY ANTROSTOMY AND REMOVAL OF DISEASED TISSUE ;  Surgeon: Jerrell Belfast, MD;  Location: [place];  Service: ENT;  Laterality: Left;  . TOTAL KNEE ARTHROPLASTY     bilaterally    Family History  Problem Relation Age of Onset  . Breast cancer Mother   . Heart attack Mother        early 42s  . Osteoporosis Mother   . Diabetes Mother   . Heart attack Father 84  . Heart disease Sister        CABG in late 82s  . Cancer Sister        CERVICAL  . Diabetes Sister   . Diabetes Maternal Aunt        X2  . Diabetes Maternal Grandmother   . Stroke Neg Hx     Social History   Tobacco Use  . Smoking status: Never Smoker  . Smokeless tobacco: Never Used  Substance Use Topics  . Alcohol use: Yes    Alcohol/week: 7.0 standard drinks    Types: 7  Glasses of wine per week    Comment: Wine at dinner    Subjective:   I connected with Danielle Mcgee on 06/15/20 at 11:20 AM EDT by a telephone call and verified that I am speaking with the correct person using two identifiers.   I discussed the limitations of evaluation and management by telemedicine and the availability of in person appointments. The patient expressed understanding and agreed to proceed.  Cough x  2 weeks; was exposed to RSV at a family reunion; no shortness of breath or difficulty breathing; is fully vaccinated against COVID; concerned about lingering symptoms; her husband was tested for COVID this morning and was negative- being treated for similar symptoms;    Objective:  There were no vitals filed for this visit.  Lungs: Respirations unlabored;  Neurologic: Alert and oriented; speech intact;    Assessment:  1. Acute  bronchitis, unspecified organism     Plan:  Rx for Z-pak, Tessalon Perles; increase fluids, rest and follow up worse, no better; she will need to get COVID test if symptoms persist and she agrees;  Time spent 11 minutes  No follow-ups on file.  No orders of the defined types were placed in this encounter.   Requested Prescriptions   Signed Prescriptions Disp Refills  . azithromycin (ZITHROMAX) 250 MG tablet 6 tablet 0    Sig: 2 tabs po qd x 1 day; 1 tablet per day x 4 days;  . benzonatate (TESSALON) 100 MG capsule 20 capsule 0    Sig: Take 1 capsule (100 mg total) by mouth 3 (three) times daily as needed.

## 2020-06-22 ENCOUNTER — Other Ambulatory Visit: Payer: Self-pay

## 2020-06-22 ENCOUNTER — Ambulatory Visit
Admission: RE | Admit: 2020-06-22 | Discharge: 2020-06-22 | Disposition: A | Discharge status: Home / Self Care | Payer: Medicare Other | Source: Ambulatory Visit | Attending: Internal Medicine | Admitting: Internal Medicine

## 2020-06-22 DIAGNOSIS — Z1231 Encounter for screening mammogram for malignant neoplasm of breast: Secondary | ICD-10-CM

## 2020-07-26 DIAGNOSIS — Z23 Encounter for immunization: Secondary | ICD-10-CM | POA: Diagnosis not present

## 2020-08-13 DIAGNOSIS — M545 Low back pain, unspecified: Secondary | ICD-10-CM | POA: Diagnosis not present

## 2020-11-08 ENCOUNTER — Ambulatory Visit: Payer: Medicare Other | Admitting: Podiatry

## 2020-11-10 ENCOUNTER — Ambulatory Visit (INDEPENDENT_AMBULATORY_CARE_PROVIDER_SITE_OTHER): Payer: Medicare Other

## 2020-11-10 ENCOUNTER — Encounter: Payer: Self-pay | Admitting: Podiatry

## 2020-11-10 ENCOUNTER — Ambulatory Visit (INDEPENDENT_AMBULATORY_CARE_PROVIDER_SITE_OTHER): Payer: Medicare Other | Admitting: Podiatry

## 2020-11-10 ENCOUNTER — Other Ambulatory Visit: Payer: Self-pay

## 2020-11-10 DIAGNOSIS — M79672 Pain in left foot: Secondary | ICD-10-CM

## 2020-11-10 DIAGNOSIS — M2041 Other hammer toe(s) (acquired), right foot: Secondary | ICD-10-CM

## 2020-11-10 DIAGNOSIS — M79671 Pain in right foot: Secondary | ICD-10-CM

## 2020-11-11 NOTE — Progress Notes (Signed)
Subjective:   Patient ID: Danielle Mcgee, female   DOB: 87 y.o.   MRN: [id number]   HPI Patient presents stating her second toe on her right foot has really been bothering her and she should have had surgery a number of years ago but she like to try to do something to help as she has a lot of pain when she tries to wear shoe gear.  Patient does not smoke he is in good health and is well oriented and likes to be active   Review of Systems  All other systems reviewed and are negative.       Objective:  Physical Exam Vitals and nursing note reviewed.  Constitutional:      Appearance: She is well-developed and well-nourished.  Cardiovascular:     Pulses: Intact distal pulses.  Pulmonary:     Effort: Pulmonary effort is normal.  Musculoskeletal:        General: Normal range of motion.  Skin:    General: Skin is warm.  Neurological:     Mental Status: She is alert.     Neurovascular status found to be intact muscle strength is found to be adequate range of motion is adequate subtalar midtarsal joint.  I did note that the second toe right is rigidly contracted and there is tightness at the MPJ and it appears the extensor tendon is a part of the pathological process present with a keratotic lesion on the head of the proximal phalanx digit to     Assessment:  Digital deformity digit to right creating pressure with shoes and irritation of her skin     Plan:  H&P reviewed condition.  We discussed treatment options and we we will use padding but she is tried this before without relief and she is looking for something a little bit more long-term.  We are going to try tenotomy with release of the MPJ and lower the toe in the office with the consideration long-term for digital fusion or removal of bone if symptoms persist.  Patient wants this surgery and signed consent form after review understanding risk and understands this may or may not solve her problem.  Encouraged to call with  questions concerns which may arise  X-rays indicate significant elevation digit to right with rigid contracture at the MPJ

## 2020-11-15 ENCOUNTER — Encounter: Payer: Self-pay | Admitting: Podiatry

## 2020-11-15 ENCOUNTER — Ambulatory Visit (INDEPENDENT_AMBULATORY_CARE_PROVIDER_SITE_OTHER): Payer: Medicare Other | Admitting: Podiatry

## 2020-11-15 ENCOUNTER — Other Ambulatory Visit: Payer: Self-pay

## 2020-11-15 DIAGNOSIS — M2041 Other hammer toe(s) (acquired), right foot: Secondary | ICD-10-CM | POA: Diagnosis not present

## 2020-11-16 NOTE — Progress Notes (Signed)
Subjective:   Patient ID: Danielle Mcgee, female   DOB: 87 y.o.   MRN: [id number]   HPI Patient presents stating that his second toe right foot is really bothering her and she wants the procedure to try to lower the toe without bone surgery   ROS      Objective:  Physical Exam  Neurovascular status intact with elevated second digit right with MPJ involvement and tightening of the joint and the tendon itself     Assessment:  Chronic hammertoe deformity second right pain with elevation of the toe      Plan:  Reviewed condition and recommended correction of deformity. I went ahead today I anesthetized the right forefoot sterile prep applied to the toe and using sterile sharp instrumentation I penetrated the skin and released the extensor tendon to the second toe along with the MPJ. The toe came down adequately I sutured with 5-0 nylon after flushing the area and applied sterile dressing. I gave instructions on lowering the toe and to continue to do this for the next 2 weeks and will be seen back to recheck

## 2020-12-01 ENCOUNTER — Ambulatory Visit (INDEPENDENT_AMBULATORY_CARE_PROVIDER_SITE_OTHER): Payer: Medicare Other | Admitting: Podiatry

## 2020-12-01 ENCOUNTER — Other Ambulatory Visit: Payer: Self-pay

## 2020-12-01 ENCOUNTER — Encounter: Payer: Self-pay | Admitting: Podiatry

## 2020-12-01 DIAGNOSIS — M2041 Other hammer toe(s) (acquired), right foot: Secondary | ICD-10-CM

## 2020-12-01 DIAGNOSIS — M79672 Pain in left foot: Secondary | ICD-10-CM

## 2020-12-01 DIAGNOSIS — M79671 Pain in right foot: Secondary | ICD-10-CM

## 2020-12-05 NOTE — Progress Notes (Signed)
Subjective:   Patient ID: Danielle Mcgee, female   DOB: 87 y.o.   MRN: [id number]   HPI Patient states doing well with only mild discomfort now only with stitch encapsulating digit.  Coapted with lowering of the second digit   ROS      Objective:  Physical Exam  Neurovascular status intact with second digit right in much better condition lowered significantly from preoperatively     Assessment:  Doing well post digital release at the MPJ     Plan:  Stitch removed wound edges coapted well dispensed brace that we will provide for compression and also will provide for lowering of the second toe with encouragement to plantarflex the second digit.  Patient will be seen back as needed  X-ray indicates there is lowering of the second digit right foot at the current time signed this

## 2021-01-11 DIAGNOSIS — R7989 Other specified abnormal findings of blood chemistry: Secondary | ICD-10-CM | POA: Insufficient documentation

## 2021-01-11 NOTE — Progress Notes (Signed)
Subjective:    Patient ID: Danielle Mcgee, female    DOB: [birth year], 87 y.o.   MRN: [id number]   This visit occurred during the SARS-CoV-2 public health emergency.  Safety protocols were in place, including screening questions prior to the visit, additional usage of staff PPE, and extensive cleaning of exam room while observing appropriate contact time as indicated for disinfecting solutions.    HPI She is here for follow up of her chronic medical conditions - osteoporosis, hyperlipidemia, hyperglycemia and elevated tsh..   She has a persistent runny nose and PND.  She has been taested for allergies int he past and it was negative.   She is very active.  She typically walks regularly.     Medications and allergies reviewed with patient and updated if appropriate.  Patient Active Problem List   Diagnosis Date Noted  . Elevated TSH 01/11/2021  . Onychomycosis of toenail 06/16/2019  . Mixed stress and urge urinary incontinence 06/11/2017  . Hyperglycemia 06/10/2017  . Paresthesia and pain of right extremity 06/16/2016  . Mild cardiomegaly 09/04/2015  . Chronic left maxillary sinusitis 07/08/2015  . Perennial allergic rhinitis 03/13/2014  . Temporal arteritis ([place]) 09/20/2011  . Hyperlipidemia 04/19/2009  . Osteoporosis 04/19/2009    Current Outpatient Medications on File Prior to Visit  Medication Sig Dispense Refill  . aspirin EC 81 MG tablet Take 81 mg by mouth daily.    . Calcium Carbonate-Vitamin D (CALCIUM + D PO) Take 1,200 mg by mouth daily.    . Multiple Vitamins-Minerals (CENTRUM SILVER PO) Take 1 tablet by mouth daily.     No current facility-administered medications on file prior to visit.    Past Medical History:  Diagnosis Date  . Benign breast disease 2000  . DJD (degenerative joint disease)   . Generalized headaches   . Heart murmur   . Hyperlipidemia   . Mitral click-murmur syndrome    nomitral  reguritation  . Osteopenia   . Temporal arteritis  ([place])    2013-14    Past Surgical History:  Procedure Laterality Date  . APPENDECTOMY  1962  . ARTERY BIOPSY  09/20/2011   Procedure: BIOPSY TEMPORAL ARTERY;  Surgeon: Adin Hector, MD;  Location: [facility];  Service: General;  Laterality: Left;  . BREAST BIOPSY  2000   Atypical Lobular Hyperplasia  . CATARACT EXTRACTION     bilaterally  . COLONOSCOPY  2002 & 2013   Dr Olevia Perches  . ELBOW SURGERY  2009   lt fx  . ENDOSCOPIC TURBINATE REDUCTION Bilateral 10/06/2015   Procedure: ENDOSCOPIC TURBINATE REDUCTION;  Surgeon: Jerrell Belfast, MD;  Location: [facility];  Service: ENT;  Laterality: Bilateral;  . EYE SURGERY    . FRACTURE SURGERY Bilateral    wrist  . JOINT REPLACEMENT  2001   both-knees  . SINUS ENDO W/FUSION Left 10/06/2015   Procedure: LEFT ENDOSCOPIC SINUS SURGERY WITH MAXILLARY ANTROSTOMY AND REMOVAL OF DISEASED TISSUE ;  Surgeon: Jerrell Belfast, MD;  Location: [facility];  Service: ENT;  Laterality: Left;  . TOTAL KNEE ARTHROPLASTY     bilaterally    Social History   Socioeconomic History  . Marital status: Widowed    Spouse name: Not on file  . Number of children: 4  . Years of education: College  . Highest education level: Not on file  Occupational History  . Occupation: Retired  Tobacco Use  . Smoking status: Never Smoker  . Smokeless tobacco: Never Used  Substance and  Sexual Activity  . Alcohol use: Yes    Alcohol/week: 7.0 standard drinks    Types: 7 Glasses of wine per week    Comment: Wine at dinner  . Drug use: No  . Sexual activity: Not on file  Other Topics Concern  . Not on file  Social History Narrative   Lives at home alone   Right-handed   Caffeine: 1-3 Coke/tea   Exercise: walking regularly   Social Determinants of Health   Financial Resource Strain: Not on file  Food Insecurity: Not on file  Transportation Needs: Not on file  Physical Activity: Not on file  Stress: Not on file  Social Connections: Not on file    Family History   Problem Relation Age of Onset  . Breast cancer Mother   . Heart attack Mother        early 7s  . Osteoporosis Mother   . Diabetes Mother   . Heart attack Father 79  . Heart disease Sister        CABG in late 9s  . Cancer Sister        CERVICAL  . Diabetes Sister   . Diabetes Maternal Aunt        X2  . Diabetes Maternal Grandmother   . Stroke Neg Hx     Review of Systems  Constitutional: Negative for chills and fever.  HENT: Positive for postnasal drip and rhinorrhea. Negative for congestion and sinus pain.   Eyes: Negative for visual disturbance.  Respiratory: Negative for cough, shortness of breath and wheezing.   Cardiovascular: Negative for chest pain, palpitations and leg swelling.  Gastrointestinal: Negative for abdominal pain, blood in stool, constipation, diarrhea and nausea.       No gerd  Genitourinary: Negative for dysuria.  Musculoskeletal: Negative for arthralgias and back pain.  Skin: Negative for color change and rash.  Neurological: Negative for dizziness, light-headedness and headaches.  Psychiatric/Behavioral: Negative for dysphoric mood and sleep disturbance. The patient is not nervous/anxious.        Objective:   Vitals:   01/12/21 1323  BP: 118/70  Pulse: (!) 101  Temp: 97.9 F (36.6 C)  SpO2: 96%   Filed Weights   01/12/21 1323  Weight: 101 lb (45.8 kg)   Body mass index is 19.08 kg/m.  BP Readings from Last 3 Encounters:  01/12/21 118/70  07/28/19 134/72  06/16/19 134/62    Wt Readings from Last 3 Encounters:  01/12/21 101 lb (45.8 kg)  07/28/19 108 lb (49 kg)  06/16/19 110 lb (49.9 kg)     Physical Exam Constitutional: She appears well-developed and well-nourished. No distress.  HENT:  Head: Normocephalic and atraumatic.  Right Ear: External ear normal. Normal ear canal and TM Left Ear: External ear normal.  Normal ear canal and TM Mouth/Throat: Oropharynx is clear and moist.  Eyes: Conjunctivae and EOM are normal.   Neck: Neck supple. No tracheal deviation present. No thyromegaly present.  No carotid bruit  Cardiovascular: Normal rate, regular rhythm and normal heart sounds.   No murmur heard.  No edema. Pulmonary/Chest: Effort normal and breath sounds normal. No respiratory distress. She has no wheezes. She has no rales.  Abdominal: Soft. She exhibits no distension. There is no tenderness.  Lymphadenopathy: She has no cervical adenopathy.  Skin: Skin is warm and dry. She is not diaphoretic.  Psychiatric: She has a normal mood and affect. Her behavior is normal.        Assessment & Plan:  See Problem List for Assessment and Plan of chronic medical problems.

## 2021-01-11 NOTE — Patient Instructions (Addendum)
  Blood work was ordered.     You had an EKG today - this showed Afib.    Medications changes include :   Ipratropium nasal spray daily, stop the aspirin and start eliquis 2.5 mg twice a day.     Your prescription(s) have been submitted to your pharmacy. Please take as directed and contact our office if you believe you are having problem(s) with the medication(s).   A referral was ordered for cardiology - they will call you.    Please followup in 1 year

## 2021-01-12 ENCOUNTER — Other Ambulatory Visit: Payer: Self-pay

## 2021-01-12 ENCOUNTER — Encounter: Payer: Self-pay | Admitting: Internal Medicine

## 2021-01-12 ENCOUNTER — Ambulatory Visit (INDEPENDENT_AMBULATORY_CARE_PROVIDER_SITE_OTHER): Payer: Medicare Other | Admitting: Internal Medicine

## 2021-01-12 VITALS — BP 118/70 | HR 101 | Temp 97.9°F | Ht 61.0 in | Wt 101.0 lb

## 2021-01-12 DIAGNOSIS — I4891 Unspecified atrial fibrillation: Secondary | ICD-10-CM | POA: Diagnosis not present

## 2021-01-12 DIAGNOSIS — I499 Cardiac arrhythmia, unspecified: Secondary | ICD-10-CM | POA: Diagnosis not present

## 2021-01-12 DIAGNOSIS — E782 Mixed hyperlipidemia: Secondary | ICD-10-CM | POA: Diagnosis not present

## 2021-01-12 DIAGNOSIS — J3489 Other specified disorders of nose and nasal sinuses: Secondary | ICD-10-CM

## 2021-01-12 DIAGNOSIS — R739 Hyperglycemia, unspecified: Secondary | ICD-10-CM

## 2021-01-12 DIAGNOSIS — R7989 Other specified abnormal findings of blood chemistry: Secondary | ICD-10-CM | POA: Diagnosis not present

## 2021-01-12 DIAGNOSIS — M810 Age-related osteoporosis without current pathological fracture: Secondary | ICD-10-CM | POA: Diagnosis not present

## 2021-01-12 LAB — COMPREHENSIVE METABOLIC PANEL
ALT: 29 U/L (ref 0–35)
AST: 32 U/L (ref 0–37)
Albumin: 4 g/dL (ref 3.5–5.2)
Alkaline Phosphatase: 104 U/L (ref 39–117)
BUN: 27 mg/dL — ABNORMAL HIGH (ref 6–23)
CO2: 27 mEq/L (ref 19–32)
Calcium: 9.8 mg/dL (ref 8.4–10.5)
Chloride: 103 mEq/L (ref 96–112)
Creatinine, Ser: 0.7 mg/dL (ref 0.40–1.20)
GFR: 77.18 mL/min (ref >60.00)
Glucose, Bld: 80 mg/dL (ref 70–99)
Potassium: 4.4 mEq/L (ref 3.5–5.1)
Sodium: 139 mEq/L (ref 135–145)
Total Bilirubin: 0.6 mg/dL (ref 0.2–1.2)
Total Protein: 7 g/dL (ref 6.0–8.3)

## 2021-01-12 LAB — CBC WITH DIFFERENTIAL/PLATELET
Basophils Absolute: 0 10*3/uL (ref 0.0–0.1)
Basophils Relative: 1 % (ref 0.0–3.0)
Eosinophils Absolute: 0.1 10*3/uL (ref 0.0–0.7)
Eosinophils Relative: 1.3 % (ref 0.0–5.0)
HCT: 45.3 % (ref 36.0–46.0)
Hemoglobin: 15.1 g/dL — ABNORMAL HIGH (ref 12.0–15.0)
Lymphocytes Relative: 33.5 % (ref 12.0–46.0)
Lymphs Abs: 1.6 10*3/uL (ref 0.7–4.0)
MCHC: 33.3 g/dL (ref 30.0–36.0)
MCV: 91.9 fl (ref 78.0–100.0)
Monocytes Absolute: 0.5 10*3/uL (ref 0.1–1.0)
Monocytes Relative: 11.4 % (ref 3.0–12.0)
Neutro Abs: 2.5 10*3/uL (ref 1.4–7.7)
Neutrophils Relative %: 52.8 % (ref 43.0–77.0)
Platelets: 182 10*3/uL (ref 150.0–400.0)
RBC: 4.92 Mil/uL (ref 3.87–5.11)
RDW: 13.6 % (ref 11.5–15.5)
WBC: 4.8 10*3/uL (ref 4.0–10.5)

## 2021-01-12 LAB — LIPID PANEL
Cholesterol: 200 mg/dL (ref 0–200)
HDL: 84.1 mg/dL (ref >39.00)
LDL Cholesterol: 104 mg/dL — ABNORMAL HIGH (ref 0–99)
NonHDL: 116.38
Total CHOL/HDL Ratio: 2
Triglycerides: 61 mg/dL (ref 0.0–149.0)
VLDL: 12.2 mg/dL (ref 0.0–40.0)

## 2021-01-12 MED ORDER — IPRATROPIUM BROMIDE 0.06 % NA SOLN: 2.0000 | Freq: Every day | NASAL | 12 refills | Status: DC

## 2021-01-12 MED ORDER — APIXABAN 2.5 MG PO TABS: 2.5000 mg | ORAL_TABLET | Freq: Two times a day (BID) | ORAL | 5 refills | Status: DC

## 2021-01-12 NOTE — Assessment & Plan Note (Signed)
New EKG today Cbc, tsh, cmp

## 2021-01-12 NOTE — Assessment & Plan Note (Signed)
Chronic Has tried multiple things in the past - no allergies - was tested years ago It bothers her, especially when singing Will try ipratropium nasal spray daily

## 2021-01-12 NOTE — Assessment & Plan Note (Signed)
Chronic Low risk cmp

## 2021-01-12 NOTE — Assessment & Plan Note (Signed)
chronic dexa up to date Active, but has not been walking regularly - stressed getting back to regular walking Continue vitamins

## 2021-01-12 NOTE — Addendum Note (Signed)
Addended by: Marcina Millard on: 01/12/2021 02:41 PM   Modules accepted: Orders

## 2021-01-12 NOTE — Assessment & Plan Note (Signed)
New Irregular heart rhythm on exam, asymptomatic EKG Afib with RVR at 108 bmp, RBBB, Afib is new compared to EKG from 09/2015 Given BP - no BB or CCB Start eliquis 2.5 mg BID given age and weight Cbc, cmp, tsh Will refer to cardiology

## 2021-01-12 NOTE — Assessment & Plan Note (Signed)
Chronic Lipids, cmp, tsh For years has been controlled with lifesytle Stressed regular exercise

## 2021-01-12 NOTE — Assessment & Plan Note (Signed)
H/o elevated tsh tsh today

## 2021-01-13 LAB — TSH: TSH: 4.56 u[IU]/mL — ABNORMAL HIGH (ref 0.35–4.50)

## 2021-01-18 ENCOUNTER — Encounter: Payer: Self-pay | Admitting: Cardiology

## 2021-01-18 ENCOUNTER — Ambulatory Visit (INDEPENDENT_AMBULATORY_CARE_PROVIDER_SITE_OTHER): Payer: Medicare Other | Admitting: Cardiology

## 2021-01-18 ENCOUNTER — Other Ambulatory Visit: Payer: Self-pay

## 2021-01-18 VITALS — BP 120/76 | HR 105 | Ht 61.0 in | Wt 103.0 lb

## 2021-01-18 DIAGNOSIS — I4819 Other persistent atrial fibrillation: Secondary | ICD-10-CM

## 2021-01-18 DIAGNOSIS — Z7189 Other specified counseling: Secondary | ICD-10-CM | POA: Diagnosis not present

## 2021-01-18 MED ORDER — DILTIAZEM HCL ER COATED BEADS 120 MG PO CP24: 120.0000 mg | ORAL_CAPSULE | Freq: Every day | ORAL | 3 refills | Status: DC

## 2021-01-18 NOTE — Progress Notes (Signed)
Cardiology Office Note:    Date:  01/18/2021   ID:  Danielle Mcgee, DOB 15-Jul-1930, MRN [id number]  PCP:  Binnie Rail, MD  Cardiologist:  Buford Dresser, MD  Referring MD: Binnie Rail, MD   CC: new patient evaluation for atrial fibrillation  History of Present Illness:    Danielle Mcgee is a 87 y.o. female with a hx of osteoporosis who is seen as a new consult at the request of Burns, Claudina Lick, MD for the evaluation and management of atrial fibrillation.  Note from 01/12/21 from Dr. Quay Burow reviewed. Told she had afib at that visit, started on apixaban and referred to cardiology.  She was shocked when she got the cost of apixaban, about $500 for her first script. Not on any medications prior, hasn't had to pay for meds in the past.   Has zero symptoms, cannot tell that she is in atrial fibrillation. Stays very active, sings in the choir, no limitations.   Her husband (now deceased 38 years) had afib, and she is familiar with it. We discussed cardioversion, metoprolol, and diltiazem today. Also showed her kardiaMobile today.  Drinks a glass of wine at dinner nightly, does not want to change this. Discussed risk of bleeding. She has not had any issues.  Has not missed a dose since starting apixaban.  Denies chest pain, shortness of breath at rest or with normal exertion. No PND, orthopnea, LE edema or unexpected weight gain. No syncope or palpitations.  Past Medical History:  Diagnosis Date  . Benign breast disease 2000  . DJD (degenerative joint disease)   . Generalized headaches   . Heart murmur   . Hyperlipidemia   . Mitral click-murmur syndrome    nomitral  reguritation  . Osteopenia   . Temporal arteritis ([place])    2013-14    Past Surgical History:  Procedure Laterality Date  . APPENDECTOMY  1962  . ARTERY BIOPSY  09/20/2011   Procedure: BIOPSY TEMPORAL ARTERY;  Surgeon: Adin Hector, MD;  Location: [facility];  Service: General;  Laterality: Left;  .  BREAST BIOPSY  2000   Atypical Lobular Hyperplasia  . CATARACT EXTRACTION     bilaterally  . COLONOSCOPY  2002 & 2013   Dr Olevia Perches  . ELBOW SURGERY  2009   lt fx  . ENDOSCOPIC TURBINATE REDUCTION Bilateral 10/06/2015   Procedure: ENDOSCOPIC TURBINATE REDUCTION;  Surgeon: Jerrell Belfast, MD;  Location: [facility];  Service: ENT;  Laterality: Bilateral;  . EYE SURGERY    . FRACTURE SURGERY Bilateral    wrist  . JOINT REPLACEMENT  2001   both-knees  . SINUS ENDO W/FUSION Left 10/06/2015   Procedure: LEFT ENDOSCOPIC SINUS SURGERY WITH MAXILLARY ANTROSTOMY AND REMOVAL OF DISEASED TISSUE ;  Surgeon: Jerrell Belfast, MD;  Location: [facility];  Service: ENT;  Laterality: Left;  . TOTAL KNEE ARTHROPLASTY     bilaterally    Current Medications: Current Outpatient Medications on File Prior to Visit  Medication Sig  . apixaban (ELIQUIS) 2.5 MG TABS tablet Take 1 tablet (2.5 mg total) by mouth 2 (two) times daily.  . Calcium Carbonate-Vitamin D (CALCIUM + D PO) Take 1,200 mg by mouth daily.  Marland Kitchen ipratropium (ATROVENT) 0.06 % nasal spray Place 2 sprays into both nostrils daily.  . Multiple Vitamins-Minerals (CENTRUM SILVER PO) Take 1 tablet by mouth daily.   No current facility-administered medications on file prior to visit.     Allergies:   Codeine   Social  History   Tobacco Use  . Smoking status: Never Smoker  . Smokeless tobacco: Never Used  Substance Use Topics  . Alcohol use: Yes    Alcohol/week: 7.0 standard drinks    Types: 7 Glasses of wine per week    Comment: Wine at dinner  . Drug use: No    Family History: family history includes Breast cancer in her mother; Cancer in her sister; Diabetes in her maternal aunt, maternal grandmother, mother, and sister; Heart attack in her mother; Heart attack (age of onset: 14) in her father; Heart disease in her sister; Osteoporosis in her mother. There is no history of Stroke.  ROS:   Please see the history of present illness.  Additional  pertinent ROS: Constitutional: Negative for chills, fever, night sweats, unintentional weight loss  HENT: Negative for ear pain and hearing loss.   Eyes: Negative for loss of vision and eye pain.  Respiratory: Negative for cough, sputum, wheezing.   Cardiovascular: See HPI. Gastrointestinal: Negative for abdominal pain, melena, and hematochezia.  Genitourinary: Negative for dysuria and hematuria.  Musculoskeletal: Negative for falls and myalgias.  Skin: Negative for itching and rash.  Neurological: Negative for focal weakness, focal sensory changes and loss of consciousness.  Endo/Heme/Allergies: Does not bruise/bleed easily.     EKGs/Labs/Other Studies Reviewed:    The following studies were reviewed today: No prior cardiac studies  EKG:  EKG is personally reviewed.  The ekg ordered today demonstrates atrial fibrillation with RVR, rate 105 bpm, RBBB  Recent Labs: 01/12/2021: ALT 29; BUN 27; Creatinine, Ser 0.70; Hemoglobin 15.1; Platelets 182.0; Potassium 4.4; Sodium 139; TSH 4.56  Recent Lipid Panel    Component Value Date/Time   CHOL 200 01/12/2021 1437   TRIG 61.0 01/12/2021 1437   HDL 84.10 01/12/2021 1437   CHOLHDL 2 01/12/2021 1437   VLDL 12.2 01/12/2021 1437   LDLCALC 104 (H) 01/12/2021 1437   LDLDIRECT 113.4 06/28/2011 1401    Physical Exam:    VS:  BP 120/76   Pulse (!) 105   Ht 5\' 1"  (1.549 m)   Wt 103 lb (46.7 kg)   SpO2 96%   BMI 19.46 kg/m     Wt Readings from Last 3 Encounters:  01/18/21 103 lb (46.7 kg)  01/12/21 101 lb (45.8 kg)  07/28/19 108 lb (49 kg)    GEN: Well nourished, well developed in no acute distress HEENT: Normal, moist mucous membranes NECK: No JVD CARDIAC: tachycardic, irregularly irregular rhythm, normal S1 and S2, no rubs or gallops. No murmur. VASCULAR: Radial and DP pulses 2+ bilaterally. No carotid bruits RESPIRATORY:  Clear to auscultation without rales, wheezing or rhonchi  ABDOMEN: Soft, non-tender,  non-distended MUSCULOSKELETAL:  Ambulates independently SKIN: Warm and dry, no edema NEUROLOGIC:  Alert and oriented x 3. No focal neuro deficits noted. PSYCHIATRIC:  Normal affect    ASSESSMENT:    1. Persistent atrial fibrillation ([place])   2. Encounter for anticoagulation discussion and counseling   3. Cardiac risk counseling   4. Counseling on health promotion and disease prevention    PLAN:    Atrial fibrillation, new diagnosis -ordered echocardiogram -we discussed options for management at length. After shared decision making, will proceed with cardioversion. Will schedule with me 4/8. Given recent labs, I do not need these repeated prior to cardioversion Shared Decision Making/Informed Consent The risks (stroke, cardiac arrhythmias rarely resulting in the need for a temporary or permanent pacemaker, skin irritation or burns and complications associated with conscious sedation including  aspiration, arrhythmia, respiratory failure and death), benefits (restoration of normal sinus rhythm) and alternatives of a direct current cardioversion were explained in detail to Ms. Zenia Resides and she agrees to proceed.  -we discussed interim rate control with metoprolol vs diltiazem. She wishes to avoid beta blocker if possible. Will start with low dose long acting dilitazem.  -we discussed that we may be able to drop diltiazem if she maintains sinus rhythm, but the anticoagulation will be long term -CHA2DS2/VAS Stroke Risk Points= 3 -she agrees with continuation of apixaban, despite cost. We did discuss alternatives (coumadin) vs DOACs, and she and I both prefer DOAC -follow up closely post cardioversion -if she feels no different in sinus rhythm, then in the future would consider rate control over rhythm control, depending on echo results  Cardiac risk counseling and prevention recommendations: -recommend heart healthy/Mediterranean diet, with whole grains, fruits, vegetable, fish, lean meats, nuts,  and olive oil. Limit salt. -recommend moderate walking, 3-5 times/week for 30-50 minutes each session. Aim for at least 150 minutes.week. Goal should be pace of 3 miles/hours, or walking 1.5 miles in 30 minutes -recommend avoidance of tobacco products. Avoid excess alcohol. -ASCVD risk score: The ASCVD Risk score Mikey Bussing DC Jr., et al., 2013) failed to calculate for the following reasons:   The 2013 ASCVD risk score is only valid for ages 1 to 79    Plan for follow up: 6 weeks  Buford Dresser, MD, PhD, [facility]    Medication Adjustments/Labs and Tests Ordered: Current medicines are reviewed at length with the patient today.  Concerns regarding medicines are outlined above.  Orders Placed This Encounter  Procedures  . EKG 12-Lead  . ECHOCARDIOGRAM COMPLETE   Meds ordered this encounter  Medications  . diltiazem (CARDIZEM CD) 120 MG 24 hr capsule    Sig: Take 1 capsule (120 mg total) by mouth daily.    Dispense:  90 capsule    Refill:  3    Patient Instructions  Medication Instructions:  Start Diltiazem 120 mg daily  *If you need a refill on your cardiac medications before your next appointment, please call your pharmacy*   Lab Work: None ordered   Testing/Procedures: Your physician has requested that you have an echocardiogram. Echocardiography is a painless test that uses sound waves to create images of your heart. It provides your doctor with information about the size and shape of your heart and how well your heart's chambers and valves are working. This procedure takes approximately one hour. There are no restrictions for this procedure. [place] [address]  Cardioversion ( see below instructions) [place] will need to have the coronavirus test completed prior to your procedure. An appointment has been made at 9:10 am on Tuesday 02/08/21. This is a Drive Up Visit at [address], [place], [place]  [address]. Please tell them that you are there for procedure testing. Stay in your car and someone will be with you shortly. Please make sure to have all other labs completed before this test because you will need to stay quarantined until your procedure.   Follow-Up: At [facility], you and your health needs are our priority.  As part of our continuing mission to provide you with exceptional heart care, we have created designated Provider Care Teams.  These Care Teams include your primary Cardiologist (physician) and Advanced Practice Providers (APPs -  Physician Assistants and Nurse Practitioners) who all work together to  provide you with the care you need, when you need it.  We recommend signing up for the patient portal called "MyChart".  Sign up information is provided on this After Visit Summary.  MyChart is used to connect with patients for Virtual Visits (Telemedicine).  Patients are able to view lab/test results, encounter notes, upcoming appointments, etc.  Non-urgent messages can be sent to your provider as well.   To learn more about what you can do with MyChart, go to [email or web address].    Your next appointment:   6 week(s)  The format for your next appointment:   In Person  Provider:   Buford Dresser, MD   You are scheduled for a Cardioversion with Dr. Harrell Gave.  Please arrive at the [facility] (Main Entrance A) at [facility]: [address] [place], [place] [address] on February 11, 2021 at 8 am.   DIET: Nothing to eat or drink after midnight except a sip of water with medications (see medication instructions below)  Medication Instructions:  Continue your anticoagulant: Eliquis You will need to continue your anticoagulant after your procedure until you are told by your  Provider that it is safe to stop   Labs: Per Dr. Harrell Gave, labs completed on 3/9     You must have a responsible person to drive you home and stay in the waiting area during  your procedure. Failure to do so could result in cancellation.  Bring your insurance cards.  *Special Note: Every effort is made to have your procedure done on time. Occasionally there are emergencies that occur at the hospital that may cause delays. Please be patient if a delay does occur.        Signed, Buford Dresser, MD PhD 01/18/2021  [facility]

## 2021-01-18 NOTE — Patient Instructions (Addendum)
Medication Instructions:  Start Diltiazem 120 mg daily  *If you need a refill on your cardiac medications before your next appointment, please call your pharmacy*   Lab Work: None ordered   Testing/Procedures: Your physician has requested that you have an echocardiogram. Echocardiography is a painless test that uses sound waves to create images of your heart. It provides your doctor with information about the size and shape of your heart and how well your heart's chambers and valves are working. This procedure takes approximately one hour. There are no restrictions for this procedure. [place] [address]  Cardioversion ( see below instructions) [place] will need to have the coronavirus test completed prior to your procedure. An appointment has been made at 9:10 am on Tuesday 02/08/21. This is a Drive Up Visit at [address], [place], [place] [address]. Please tell them that you are there for procedure testing. Stay in your car and someone will be with you shortly. Please make sure to have all other labs completed before this test because you will need to stay quarantined until your procedure.   Follow-Up: At [facility], you and your health needs are our priority.  As part of our continuing mission to provide you with exceptional heart care, we have created designated Provider Care Teams.  These Care Teams include your primary Cardiologist (physician) and Advanced Practice Providers (APPs -  Physician Assistants and Nurse Practitioners) who all work together to provide you with the care you need, when you need it.  We recommend signing up for the patient portal called "MyChart".  Sign up information is provided on this After Visit Summary.  MyChart is used to connect with patients for Virtual Visits (Telemedicine).  Patients are able to view lab/test results, encounter notes, upcoming appointments, etc.  Non-urgent messages can be sent to your provider as  well.   To learn more about what you can do with MyChart, go to [email or web address].    Your next appointment:   6 week(s)  The format for your next appointment:   In Person  Provider:   Buford Dresser, MD   You are scheduled for a Cardioversion with Dr. Harrell Gave.  Please arrive at the [facility] (Main Entrance A) at [facility]: [address] [place], [place] [address] on February 11, 2021 at 8 am.   DIET: Nothing to eat or drink after midnight except a sip of water with medications (see medication instructions below)  Medication Instructions:  Continue your anticoagulant: Eliquis You will need to continue your anticoagulant after your procedure until you are told by your  Provider that it is safe to stop   Labs: Per Dr. Harrell Gave, labs completed on 3/9     You must have a responsible person to drive you home and stay in the waiting area during your procedure. Failure to do so could result in cancellation.  Bring your insurance cards.  *Special Note: Every effort is made to have your procedure done on time. Occasionally there are emergencies that occur at the hospital that may cause delays. Please be patient if a delay does occur.

## 2021-01-18 NOTE — H&P (View-Only) (Signed)
Cardiology Office Note:    Date:  01/18/2021   ID:  Danielle Mcgee, DOB [birth year], MRN [id number]  PCP:  Binnie Rail, MD  Cardiologist:  Buford Dresser, MD  Referring MD: Binnie Rail, MD   CC: new patient evaluation for atrial fibrillation  History of Present Illness:    Danielle Mcgee is a 87 y.o. female with a hx of osteoporosis who is seen as a new consult at the request of Burns, Claudina Lick, MD for the evaluation and management of atrial fibrillation.  Note from 01/12/21 from Dr. Quay Burow reviewed. Told she had afib at that visit, started on apixaban and referred to cardiology.  She was shocked when she got the cost of apixaban, about $500 for her first script. Not on any medications prior, hasn't had to pay for meds in the past.   Has zero symptoms, cannot tell that she is in atrial fibrillation. Stays very active, sings in the choir, no limitations.   Her husband (now deceased 48 years) had afib, and she is familiar with it. We discussed cardioversion, metoprolol, and diltiazem today. Also showed her kardiaMobile today.  Drinks a glass of wine at dinner nightly, does not want to change this. Discussed risk of bleeding. She has not had any issues.  Has not missed a dose since starting apixaban.  Denies chest pain, shortness of breath at rest or with normal exertion. No PND, orthopnea, LE edema or unexpected weight gain. No syncope or palpitations.  Past Medical History:  Diagnosis Date  . Benign breast disease 2000  . DJD (degenerative joint disease)   . Generalized headaches   . Heart murmur   . Hyperlipidemia   . Mitral click-murmur syndrome    nomitral  reguritation  . Osteopenia   . Temporal arteritis ([place])    2013-14    Past Surgical History:  Procedure Laterality Date  . APPENDECTOMY  1962  . ARTERY BIOPSY  09/20/2011   Procedure: BIOPSY TEMPORAL ARTERY;  Surgeon: Adin Hector, MD;  Location: [place];  Service: General;  Laterality: Left;  .  BREAST BIOPSY  2000   Atypical Lobular Hyperplasia  . CATARACT EXTRACTION     bilaterally  . COLONOSCOPY  2002 & 2013   Dr Olevia Perches  . ELBOW SURGERY  2009   lt fx  . ENDOSCOPIC TURBINATE REDUCTION Bilateral 10/06/2015   Procedure: ENDOSCOPIC TURBINATE REDUCTION;  Surgeon: Jerrell Belfast, MD;  Location: [facility];  Service: ENT;  Laterality: Bilateral;  . EYE SURGERY    . FRACTURE SURGERY Bilateral    wrist  . JOINT REPLACEMENT  2001   both-knees  . SINUS ENDO W/FUSION Left 10/06/2015   Procedure: LEFT ENDOSCOPIC SINUS SURGERY WITH MAXILLARY ANTROSTOMY AND REMOVAL OF DISEASED TISSUE ;  Surgeon: Jerrell Belfast, MD;  Location: [facility];  Service: ENT;  Laterality: Left;  . TOTAL KNEE ARTHROPLASTY     bilaterally    Current Medications: Current Outpatient Medications on File Prior to Visit  Medication Sig  . apixaban (ELIQUIS) 2.5 MG TABS tablet Take 1 tablet (2.5 mg total) by mouth 2 (two) times daily.  . Calcium Carbonate-Vitamin D (CALCIUM + D PO) Take 1,200 mg by mouth daily.  Marland Kitchen ipratropium (ATROVENT) 0.06 % nasal spray Place 2 sprays into both nostrils daily.  . Multiple Vitamins-Minerals (CENTRUM SILVER PO) Take 1 tablet by mouth daily.   No current facility-administered medications on file prior to visit.     Allergies:   Codeine   Social  History   Tobacco Use  . Smoking status: Never Smoker  . Smokeless tobacco: Never Used  Substance Use Topics  . Alcohol use: Yes    Alcohol/week: 7.0 standard drinks    Types: 7 Glasses of wine per week    Comment: Wine at dinner  . Drug use: No    Family History: family history includes Breast cancer in her mother; Cancer in her sister; Diabetes in her maternal aunt, maternal grandmother, mother, and sister; Heart attack in her mother; Heart attack (age of onset: 65) in her father; Heart disease in her sister; Osteoporosis in her mother. There is no history of Stroke.  ROS:   Please see the history of present illness.  Additional  pertinent ROS: Constitutional: Negative for chills, fever, night sweats, unintentional weight loss  HENT: Negative for ear pain and hearing loss.   Eyes: Negative for loss of vision and eye pain.  Respiratory: Negative for cough, sputum, wheezing.   Cardiovascular: See HPI. Gastrointestinal: Negative for abdominal pain, melena, and hematochezia.  Genitourinary: Negative for dysuria and hematuria.  Musculoskeletal: Negative for falls and myalgias.  Skin: Negative for itching and rash.  Neurological: Negative for focal weakness, focal sensory changes and loss of consciousness.  Endo/Heme/Allergies: Does not bruise/bleed easily.     EKGs/Labs/Other Studies Reviewed:    The following studies were reviewed today: No prior cardiac studies  EKG:  EKG is personally reviewed.  The ekg ordered today demonstrates atrial fibrillation with RVR, rate 105 bpm, RBBB  Recent Labs: 01/12/2021: ALT 29; BUN 27; Creatinine, Ser 0.70; Hemoglobin 15.1; Platelets 182.0; Potassium 4.4; Sodium 139; TSH 4.56  Recent Lipid Panel    Component Value Date/Time   CHOL 200 01/12/2021 1437   TRIG 61.0 01/12/2021 1437   HDL 84.10 01/12/2021 1437   CHOLHDL 2 01/12/2021 1437   VLDL 12.2 01/12/2021 1437   LDLCALC 104 (H) 01/12/2021 1437   LDLDIRECT 113.4 06/28/2011 1401    Physical Exam:    VS:  BP 120/76   Pulse (!) 105   Ht 5\' 1"  (1.549 m)   Wt 103 lb (46.7 kg)   SpO2 96%   BMI 19.46 kg/m     Wt Readings from Last 3 Encounters:  01/18/21 103 lb (46.7 kg)  01/12/21 101 lb (45.8 kg)  07/28/19 108 lb (49 kg)    GEN: Well nourished, well developed in no acute distress HEENT: Normal, moist mucous membranes NECK: No JVD CARDIAC: tachycardic, irregularly irregular rhythm, normal S1 and S2, no rubs or gallops. No murmur. VASCULAR: Radial and DP pulses 2+ bilaterally. No carotid bruits RESPIRATORY:  Clear to auscultation without rales, wheezing or rhonchi  ABDOMEN: Soft, non-tender,  non-distended MUSCULOSKELETAL:  Ambulates independently SKIN: Warm and dry, no edema NEUROLOGIC:  Alert and oriented x 3. No focal neuro deficits noted. PSYCHIATRIC:  Normal affect    ASSESSMENT:    1. Persistent atrial fibrillation ([place])   2. Encounter for anticoagulation discussion and counseling   3. Cardiac risk counseling   4. Counseling on health promotion and disease prevention    PLAN:    Atrial fibrillation, new diagnosis -ordered echocardiogram -we discussed options for management at length. After shared decision making, will proceed with cardioversion. Will schedule with me 4/8. Given recent labs, I do not need these repeated prior to cardioversion Shared Decision Making/Informed Consent The risks (stroke, cardiac arrhythmias rarely resulting in the need for a temporary or permanent pacemaker, skin irritation or burns and complications associated with conscious sedation including  aspiration, arrhythmia, respiratory failure and death), benefits (restoration of normal sinus rhythm) and alternatives of a direct current cardioversion were explained in detail to Ms. Zenia Resides and she agrees to proceed.  -we discussed interim rate control with metoprolol vs diltiazem. She wishes to avoid beta blocker if possible. Will start with low dose long acting dilitazem.  -we discussed that we may be able to drop diltiazem if she maintains sinus rhythm, but the anticoagulation will be long term -CHA2DS2/VAS Stroke Risk Points= 3 -she agrees with continuation of apixaban, despite cost. We did discuss alternatives (coumadin) vs DOACs, and she and I both prefer DOAC -follow up closely post cardioversion -if she feels no different in sinus rhythm, then in the future would consider rate control over rhythm control, depending on echo results  Cardiac risk counseling and prevention recommendations: -recommend heart healthy/Mediterranean diet, with whole grains, fruits, vegetable, fish, lean meats, nuts,  and olive oil. Limit salt. -recommend moderate walking, 3-5 times/week for 30-50 minutes each session. Aim for at least 150 minutes.week. Goal should be pace of 3 miles/hours, or walking 1.5 miles in 30 minutes -recommend avoidance of tobacco products. Avoid excess alcohol. -ASCVD risk score: The ASCVD Risk score Mikey Bussing DC Jr., et al., 2013) failed to calculate for the following reasons:   The 2013 ASCVD risk score is only valid for ages 76 to 44    Plan for follow up: 6 weeks  Buford Dresser, MD, PhD, [facility]    Medication Adjustments/Labs and Tests Ordered: Current medicines are reviewed at length with the patient today.  Concerns regarding medicines are outlined above.  Orders Placed This Encounter  Procedures  . EKG 12-Lead  . ECHOCARDIOGRAM COMPLETE   Meds ordered this encounter  Medications  . diltiazem (CARDIZEM CD) 120 MG 24 hr capsule    Sig: Take 1 capsule (120 mg total) by mouth daily.    Dispense:  90 capsule    Refill:  3    Patient Instructions  Medication Instructions:  Start Diltiazem 120 mg daily  *If you need a refill on your cardiac medications before your next appointment, please call your pharmacy*   Lab Work: None ordered   Testing/Procedures: Your physician has requested that you have an echocardiogram. Echocardiography is a painless test that uses sound waves to create images of your heart. It provides your doctor with information about the size and shape of your heart and how well your heart's chambers and valves are working. This procedure takes approximately one hour. There are no restrictions for this procedure. [place] [address]  Cardioversion ( see below instructions) [place] will need to have the coronavirus test completed prior to your procedure. An appointment has been made at 9:10 am on Tuesday 02/08/21. This is a Drive Up Visit at [address], [place], [place]  [address]. Please tell them that you are there for procedure testing. Stay in your car and someone will be with you shortly. Please make sure to have all other labs completed before this test because you will need to stay quarantined until your procedure.   Follow-Up: At [facility], you and your health needs are our priority.  As part of our continuing mission to provide you with exceptional heart care, we have created designated Provider Care Teams.  These Care Teams include your primary Cardiologist (physician) and Advanced Practice Providers (APPs -  Physician Assistants and Nurse Practitioners) who all work together to  provide you with the care you need, when you need it.  We recommend signing up for the patient portal called "MyChart".  Sign up information is provided on this After Visit Summary.  MyChart is used to connect with patients for Virtual Visits (Telemedicine).  Patients are able to view lab/test results, encounter notes, upcoming appointments, etc.  Non-urgent messages can be sent to your provider as well.   To learn more about what you can do with MyChart, go to [email or web address].    Your next appointment:   6 week(s)  The format for your next appointment:   In Person  Provider:   Buford Dresser, MD   You are scheduled for a Cardioversion with Dr. Harrell Gave.  Please arrive at the [facility] (Main Entrance A) at [facility]: [address] [place], [place] [address] on February 11, 2021 at 8 am.   DIET: Nothing to eat or drink after midnight except a sip of water with medications (see medication instructions below)  Medication Instructions:  Continue your anticoagulant: Eliquis You will need to continue your anticoagulant after your procedure until you are told by your  Provider that it is safe to stop   Labs: Per Dr. Harrell Gave, labs completed on 3/9     You must have a responsible person to drive you home and stay in the waiting area during  your procedure. Failure to do so could result in cancellation.  Bring your insurance cards.  *Special Note: Every effort is made to have your procedure done on time. Occasionally there are emergencies that occur at the hospital that may cause delays. Please be patient if a delay does occur.        Signed, Buford Dresser, MD PhD 01/18/2021  [facility]

## 2021-01-19 ENCOUNTER — Ambulatory Visit (HOSPITAL_COMMUNITY)
Admission: RE | Admit: 2021-01-19 | Discharge: 2021-01-19 | Disposition: A | Discharge status: Home / Self Care | Payer: Medicare Other | Source: Ambulatory Visit | Attending: Cardiology | Admitting: Cardiology

## 2021-01-19 DIAGNOSIS — I082 Rheumatic disorders of both aortic and tricuspid valves: Secondary | ICD-10-CM | POA: Insufficient documentation

## 2021-01-19 DIAGNOSIS — I4819 Other persistent atrial fibrillation: Secondary | ICD-10-CM

## 2021-01-19 DIAGNOSIS — I4891 Unspecified atrial fibrillation: Secondary | ICD-10-CM | POA: Diagnosis not present

## 2021-01-19 LAB — ECHOCARDIOGRAM COMPLETE
Calc EF: 40.7 %
P 1/2 time: 455 msec
S' Lateral: 3.3 cm
Single Plane A2C EF: 43.8 %
Single Plane A4C EF: 36.5 %

## 2021-01-19 NOTE — Progress Notes (Signed)
  Echocardiogram 2D Echocardiogram has been performed.  Danielle Mcgee 01/19/2021, 3:44 PM

## 2021-02-03 DIAGNOSIS — Z23 Encounter for immunization: Secondary | ICD-10-CM | POA: Diagnosis not present

## 2021-02-08 ENCOUNTER — Other Ambulatory Visit (HOSPITAL_COMMUNITY)
Admission: RE | Admit: 2021-02-08 | Discharge: 2021-02-08 | Disposition: A | Discharge status: Home / Self Care | Payer: Medicare Other | Source: Ambulatory Visit | Attending: Cardiology | Admitting: Cardiology

## 2021-02-08 ENCOUNTER — Other Ambulatory Visit (HOSPITAL_COMMUNITY): Payer: Medicare Other

## 2021-02-08 DIAGNOSIS — Z20822 Contact with and (suspected) exposure to covid-19: Secondary | ICD-10-CM | POA: Diagnosis not present

## 2021-02-08 DIAGNOSIS — Z01812 Encounter for preprocedural laboratory examination: Secondary | ICD-10-CM | POA: Insufficient documentation

## 2021-02-08 LAB — SARS CORONAVIRUS 2 (TAT 6-24 HRS): SARS Coronavirus 2: NEGATIVE

## 2021-02-10 NOTE — Anesthesia Preprocedure Evaluation (Addendum)
Anesthesia Evaluation  Patient identified by MRN, date of birth, ID band Patient awake    Reviewed: Allergy & Precautions, Patient's Chart, lab work & pertinent test results  Airway Mallampati: II  TM Distance: >3 FB Neck ROM: Full    Dental no notable dental hx. (+) Teeth Intact, Dental Advisory Given   Pulmonary neg pulmonary ROS,    Pulmonary exam normal breath sounds clear to auscultation       Cardiovascular negative cardio ROS Normal cardiovascular exam+ dysrhythmias Atrial Fibrillation  Rhythm:Irregular Rate:Abnormal     Neuro/Psych  Headaches, negative psych ROS   GI/Hepatic Neg liver ROS,   Endo/Other  negative endocrine ROS  Renal/GU negative Renal ROS     Musculoskeletal  (+) Arthritis ,   Abdominal   Peds  Hematology   Anesthesia Other Findings   Reproductive/Obstetrics                            Anesthesia Physical Anesthesia Plan  ASA: III  Anesthesia Plan: General   Post-op Pain Management:    Induction: Intravenous  PONV Risk Score and Plan: Treatment may vary due to age or medical condition and Ondansetron  Airway Management Planned: Natural Airway  Additional Equipment: None  Intra-op Plan:   Post-operative Plan:   Informed Consent: I have reviewed the patients History and Physical, chart, labs and discussed the procedure including the risks, benefits and alternatives for the proposed anesthesia with the patient or authorized representative who has indicated his/her understanding and acceptance.     Dental advisory given  Plan Discussed with: CRNA and Anesthesiologist  Anesthesia Plan Comments:        Anesthesia Quick Evaluation

## 2021-02-11 ENCOUNTER — Ambulatory Visit (HOSPITAL_COMMUNITY)
Admission: RE | Admit: 2021-02-11 | Discharge: 2021-02-11 | Disposition: A | Discharge status: Home / Self Care | Payer: Medicare Other | Source: Ambulatory Visit | Attending: Cardiology | Admitting: Cardiology

## 2021-02-11 ENCOUNTER — Other Ambulatory Visit: Payer: Self-pay

## 2021-02-11 ENCOUNTER — Ambulatory Visit (HOSPITAL_COMMUNITY): Payer: Medicare Other | Admitting: Anesthesiology

## 2021-02-11 ENCOUNTER — Encounter (HOSPITAL_COMMUNITY)
Admission: RE | Disposition: A | Discharge status: Home / Self Care | Payer: Self-pay | Source: Ambulatory Visit | Attending: Cardiology

## 2021-02-11 ENCOUNTER — Encounter (HOSPITAL_COMMUNITY): Payer: Self-pay | Admitting: Cardiology

## 2021-02-11 DIAGNOSIS — Z79899 Other long term (current) drug therapy: Secondary | ICD-10-CM | POA: Diagnosis not present

## 2021-02-11 DIAGNOSIS — I517 Cardiomegaly: Secondary | ICD-10-CM | POA: Diagnosis not present

## 2021-02-11 DIAGNOSIS — Z8249 Family history of ischemic heart disease and other diseases of the circulatory system: Secondary | ICD-10-CM | POA: Diagnosis not present

## 2021-02-11 DIAGNOSIS — I4819 Other persistent atrial fibrillation: Secondary | ICD-10-CM

## 2021-02-11 DIAGNOSIS — Z885 Allergy status to narcotic agent status: Secondary | ICD-10-CM | POA: Diagnosis not present

## 2021-02-11 DIAGNOSIS — N3946 Mixed incontinence: Secondary | ICD-10-CM | POA: Diagnosis not present

## 2021-02-11 DIAGNOSIS — E785 Hyperlipidemia, unspecified: Secondary | ICD-10-CM | POA: Diagnosis not present

## 2021-02-11 DIAGNOSIS — Z96653 Presence of artificial knee joint, bilateral: Secondary | ICD-10-CM | POA: Insufficient documentation

## 2021-02-11 DIAGNOSIS — Z7901 Long term (current) use of anticoagulants: Secondary | ICD-10-CM | POA: Insufficient documentation

## 2021-02-11 DIAGNOSIS — Z803 Family history of malignant neoplasm of breast: Secondary | ICD-10-CM | POA: Insufficient documentation

## 2021-02-11 DIAGNOSIS — I4891 Unspecified atrial fibrillation: Secondary | ICD-10-CM | POA: Diagnosis not present

## 2021-02-11 HISTORY — PX: CARDIOVERSION: SHX1299

## 2021-02-11 SURGERY — CARDIOVERSION
Anesthesia: General

## 2021-02-11 MED ORDER — PROPOFOL 10 MG/ML IV BOLUS
INTRAVENOUS | Status: DC | PRN
  Administered 2021-02-11: 40 mg via INTRAVENOUS

## 2021-02-11 MED ORDER — LIDOCAINE 2% (20 MG/ML) 5 ML SYRINGE
INTRAMUSCULAR | Status: DC | PRN
  Administered 2021-02-11: 40 mg via INTRAVENOUS

## 2021-02-11 MED ORDER — APIXABAN 2.5 MG PO TABS: 2.5000 mg | ORAL_TABLET | Freq: Two times a day (BID) | ORAL | 3 refills | Status: DC

## 2021-02-11 MED ORDER — SODIUM CHLORIDE 0.9 % IV SOLN: INTRAVENOUS | Status: DC

## 2021-02-11 NOTE — Transfer of Care (Signed)
Immediate Anesthesia Transfer of Care Note  Patient: Danielle Mcgee  Procedure(s) Performed: CARDIOVERSION (N/A )  Patient Location: PACU and Endoscopy Unit  Anesthesia Type:General  Level of Consciousness: awake, alert , oriented and patient cooperative  Airway & Oxygen Therapy: Patient Spontanous Breathing and Patient connected to nasal cannula oxygen  Post-op Assessment: Report given to RN and Post -op Vital signs reviewed and stable  Post vital signs: Reviewed and stable  Last Vitals:  Vitals Value Taken Time  BP    Temp    Pulse    Resp    SpO2      Last Pain:  Vitals:   02/11/21 0800  TempSrc: Oral  PainSc: 0-No pain         Complications: No complications documented.

## 2021-02-11 NOTE — Anesthesia Postprocedure Evaluation (Signed)
Anesthesia Post Note  Patient: Danielle Mcgee  Procedure(s) Performed: CARDIOVERSION (N/A )     Patient location during evaluation: Endoscopy Anesthesia Type: General Level of consciousness: awake and alert Pain management: pain level controlled Vital Signs Assessment: post-procedure vital signs reviewed and stable Respiratory status: spontaneous breathing, nonlabored ventilation, respiratory function stable and patient connected to nasal cannula oxygen Cardiovascular status: blood pressure returned to baseline and stable Postop Assessment: no apparent nausea or vomiting Anesthetic complications: no   No complications documented.  Last Vitals:  Vitals:   02/11/21 0948 02/11/21 1001  BP: 118/71 (!) 134/59  Pulse: 94 80  Resp: 17 15  Temp: 36.5 C   SpO2: 100% 100%    Last Pain:  Vitals:   02/11/21 1001  TempSrc:   PainSc: 0-No pain                 Barnet Glasgow

## 2021-02-11 NOTE — Discharge Instructions (Signed)

## 2021-02-11 NOTE — Interval H&P Note (Signed)
History and Physical Interval Note:  02/11/2021 8:05 AM  Danielle Mcgee  has presented today for surgery, with the diagnosis of AFIB.  The various methods of treatment have been discussed with the patient and family. After consideration of risks, benefits and other options for treatment, the patient has consented to  Procedure(s): CARDIOVERSION (N/A) as a surgical intervention.  The patient's history has been reviewed, patient examined, no change in status, stable for surgery.  I have reviewed the patient's chart and labs.  Questions were answered to the patient's satisfaction.     Curt Oatis Harrell Gave

## 2021-02-11 NOTE — CV Procedure (Signed)
Procedure:   DCCV  Indication:  Symptomatic atrial fibrillation  Procedure Note:  The patient signed informed consent.  They have had had therapeutic anticoagulation with apixaban greater than 3 weeks.  Anesthesia was administered by Dr. Valma Cava.  Patient received 40 mg IV lidocaine and 40 mg IV propofol.Adequate airway was maintained throughout and vital followed per protocol.  They were cardioverted x 3 with 120, 150, 200J of biphasic synchronized energy with anterior pressure.  She did not convert to sinus rhythm and remained in atrial fibrillation.  There were no apparent complications.  The patient had normal neuro status and respiratory status post procedure with vitals stable as recorded elsewhere.    Follow up:  They will continue on current medical therapy and follow up with cardiology as scheduled.  Buford Dresser, MD PhD 02/11/2021 10:03 AM

## 2021-02-11 NOTE — Anesthesia Procedure Notes (Signed)
Procedure Name: General with mask airway Date/Time: 02/11/2021 9:42 AM Performed by: Lowella Dell, CRNA Pre-anesthesia Checklist: Patient identified, Emergency Drugs available, Suction available, Patient being monitored and Timeout performed Patient Re-evaluated:Patient Re-evaluated prior to induction Oxygen Delivery Method: Ambu bag Induction Type: IV induction Ventilation: Mask ventilation without difficulty Placement Confirmation: positive ETCO2 Dental Injury: Teeth and Oropharynx as per pre-operative assessment

## 2021-02-14 ENCOUNTER — Encounter (HOSPITAL_COMMUNITY): Payer: Self-pay | Admitting: Cardiology

## 2021-03-07 ENCOUNTER — Ambulatory Visit: Payer: Medicare Other | Admitting: Cardiology

## 2021-03-13 NOTE — Progress Notes (Deleted)
Cardiology Office Note:    Date:  03/13/2021   ID:  Danielle Mcgee, DOB 1929/12/14, MRN [id number]  PCP:  Binnie Rail, MD  Cardiologist:  Buford Dresser, MD  Electrophysiologist:  None   Referring MD: Binnie Rail, MD   Chief Complaint: ***  History of Present Illness:    Danielle Mcgee is a 87 y.o. female with a history of persistent atrial fibrillation s/p Eliquis hyperlipidemia,   Past Medical History:  Diagnosis Date  . Benign breast disease 2000  . DJD (degenerative joint disease)   . Generalized headaches   . Heart murmur   . Hyperlipidemia   . Mitral click-murmur syndrome    nomitral  reguritation  . Osteopenia   . Temporal arteritis ([place])    2013-14    Past Surgical History:  Procedure Laterality Date  . APPENDECTOMY  1962  . ARTERY BIOPSY  09/20/2011   Procedure: BIOPSY TEMPORAL ARTERY;  Surgeon: Adin Hector, MD;  Location: [place];  Service: General;  Laterality: Left;  . BREAST BIOPSY  2000   Atypical Lobular Hyperplasia  . CARDIOVERSION N/A 02/11/2021   Procedure: CARDIOVERSION;  Surgeon: Buford Dresser, MD;  Location: [facility] ENDOSCOPY;  Service: Cardiovascular;  Laterality: N/A;  . CATARACT EXTRACTION     bilaterally  . COLONOSCOPY  2002 & 2013   Dr Olevia Perches  . ELBOW SURGERY  2009   lt fx  . ENDOSCOPIC TURBINATE REDUCTION Bilateral 10/06/2015   Procedure: ENDOSCOPIC TURBINATE REDUCTION;  Surgeon: Jerrell Belfast, MD;  Location: [place];  Service: ENT;  Laterality: Bilateral;  . EYE SURGERY    . FRACTURE SURGERY Bilateral    wrist  . JOINT REPLACEMENT  2001   both-knees  . SINUS ENDO W/FUSION Left 10/06/2015   Procedure: LEFT ENDOSCOPIC SINUS SURGERY WITH MAXILLARY ANTROSTOMY AND REMOVAL OF DISEASED TISSUE ;  Surgeon: Jerrell Belfast, MD;  Location: [place];  Service: ENT;  Laterality: Left;  . TOTAL KNEE ARTHROPLASTY     bilaterally    Current Medications: No outpatient medications have been marked as taking for the 03/17/21  encounter (Appointment) with Darreld Mclean, PA-C.     Allergies:   Codeine   Social History   Socioeconomic History  . Marital status: Widowed    Spouse name: Not on file  . Number of children: 4  . Years of education: College  . Highest education level: Not on file  Occupational History  . Occupation: Retired  Tobacco Use  . Smoking status: Never Smoker  . Smokeless tobacco: Never Used  Substance and Sexual Activity  . Alcohol use: Yes    Alcohol/week: 7.0 standard drinks    Types: 7 Glasses of wine per week    Comment: Wine at dinner  . Drug use: No  . Sexual activity: Not on file  Other Topics Concern  . Not on file  Social History Narrative   Lives at home alone   Right-handed   Caffeine: 1-3 Coke/tea   Exercise: walking regularly   Social Determinants of Health   Financial Resource Strain: Not on file  Food Insecurity: Not on file  Transportation Needs: Not on file  Physical Activity: Not on file  Stress: Not on file  Social Connections: Not on file     Family History: The patient's ***family history includes Breast cancer in her mother; Cancer in her sister; Diabetes in her maternal aunt, maternal grandmother, mother, and sister; Heart attack in her mother; Heart attack (age of onset: 34)  in her father; Heart disease in her sister; Osteoporosis in her mother. There is no history of Stroke.  ROS:   Please see the history of present illness.    *** All other systems reviewed and are negative.  EKGs/Labs/Other Studies Reviewed:    The following studies were reviewed today: ***  EKG:  EKG *** ordered today. EKG personally reviewed and demonstrates ***.  Recent Labs: 01/12/2021: ALT 29; BUN 27; Creatinine, Ser 0.70; Hemoglobin 15.1; Platelets 182.0; Potassium 4.4; Sodium 139; TSH 4.56  Recent Lipid Panel    Component Value Date/Time   CHOL 200 01/12/2021 1437   TRIG 61.0 01/12/2021 1437   HDL 84.10 01/12/2021 1437   CHOLHDL 2 01/12/2021 1437    VLDL 12.2 01/12/2021 1437   LDLCALC 104 (H) 01/12/2021 1437   LDLDIRECT 113.4 06/28/2011 1401    Physical Exam:    Vital Signs: There were no vitals taken for this visit.    Wt Readings from Last 3 Encounters:  02/11/21 100 lb (45.4 kg)  01/18/21 103 lb (46.7 kg)  01/12/21 101 lb (45.8 kg)     General: 87 y.o. female in no acute distress. HEENT: Normocephalic and atraumatic. Sclera clear. EOMs intact. Neck: Supple. No carotid bruits. No JVD. Heart: *** RRR. Distinct S1 and S2. No murmurs, gallops, or rubs. Radial and distal pedal pulses 2+ and equal bilaterally. Lungs: No increased work of breathing. Clear to ausculation bilaterally. No wheezes, rhonchi, or rales.  Abdomen: Soft, non-distended, and non-tender to palpation. Bowel sounds present in all 4 quadrants.  MSK: Normal strength and tone for age. *** Extremities: No lower extremity edema.    Skin: Warm and dry. Neuro: Alert and oriented x3. No focal deficits. Psych: Normal affect. Responds appropriately.   Assessment:    No diagnosis found.  Plan:     Disposition: Follow up in ***   Medication Adjustments/Labs and Tests Ordered: Current medicines are reviewed at length with the patient today.  Concerns regarding medicines are outlined above.  No orders of the defined types were placed in this encounter.  No orders of the defined types were placed in this encounter.   There are no Patient Instructions on file for this visit.   Signed, Darreld Mclean, PA-C  03/13/2021 8:17 PM    [facility]

## 2021-03-15 DIAGNOSIS — H26493 Other secondary cataract, bilateral: Secondary | ICD-10-CM | POA: Diagnosis not present

## 2021-03-15 DIAGNOSIS — Z961 Presence of intraocular lens: Secondary | ICD-10-CM | POA: Diagnosis not present

## 2021-03-15 DIAGNOSIS — H5203 Hypermetropia, bilateral: Secondary | ICD-10-CM | POA: Diagnosis not present

## 2021-03-17 ENCOUNTER — Ambulatory Visit: Payer: Medicare Other | Admitting: Student

## 2021-03-17 ENCOUNTER — Telehealth: Payer: Self-pay | Admitting: Cardiology

## 2021-03-17 NOTE — Telephone Encounter (Signed)
Patient returning call for echo results. 

## 2021-03-17 NOTE — Telephone Encounter (Signed)
Pt updated with ECHO results and verbalized understanding.  

## 2021-04-07 ENCOUNTER — Ambulatory Visit (INDEPENDENT_AMBULATORY_CARE_PROVIDER_SITE_OTHER): Payer: Medicare Other | Admitting: Internal Medicine

## 2021-04-07 ENCOUNTER — Other Ambulatory Visit: Payer: Self-pay

## 2021-04-07 ENCOUNTER — Encounter: Payer: Self-pay | Admitting: Cardiology

## 2021-04-07 VITALS — BP 108/60 | HR 86 | Ht 61.0 in | Wt 103.0 lb

## 2021-04-07 DIAGNOSIS — Z7189 Other specified counseling: Secondary | ICD-10-CM

## 2021-04-07 DIAGNOSIS — I4821 Permanent atrial fibrillation: Secondary | ICD-10-CM | POA: Diagnosis not present

## 2021-04-07 DIAGNOSIS — Z7901 Long term (current) use of anticoagulants: Secondary | ICD-10-CM | POA: Diagnosis not present

## 2021-04-07 DIAGNOSIS — I4819 Other persistent atrial fibrillation: Secondary | ICD-10-CM

## 2021-04-07 NOTE — Patient Instructions (Signed)
Medication Instructions:  Your Physician recommend you continue on your current medication as directed.    *If you need a refill on your cardiac medications before your next appointment, please call your pharmacy*   Lab Work: None ordered today   Testing/Procedures: None ordered today   Follow-Up: At [facility], you and your health needs are our priority.  As part of our continuing mission to provide you with exceptional heart care, we have created designated Provider Care Teams.  These Care Teams include your primary Cardiologist (physician) and Advanced Practice Providers (APPs -  Physician Assistants and Nurse Practitioners) who all work together to provide you with the care you need, when you need it.  We recommend signing up for the patient portal called "MyChart".  Sign up information is provided on this After Visit Summary.  MyChart is used to connect with patients for Virtual Visits (Telemedicine).  Patients are able to view lab/test results, encounter notes, upcoming appointments, etc.  Non-urgent messages can be sent to your provider as well.   To learn more about what you can do with MyChart, go to [email or web address].    Your next appointment:   1 year(s) @ [address] [place], [place] [address]   The format for your next appointment:   In Person  Provider:   Buford Dresser, MD

## 2021-04-07 NOTE — Progress Notes (Signed)
Cardiology Office Note:    Date:  04/07/2021   ID:  Danielle Mcgee, DOB 1930/06/01, MRN [id number]  PCP:  Binnie Rail, MD  Cardiologist:  Buford Dresser, MD  Referring MD: Binnie Rail, MD   CC: follow up  History of Present Illness:    Danielle Mcgee is a 87 y.o. female with a hx of osteoporosis who is seen for follow up today. I initially met her as a new consult on 01/18/21 at the request of Burns, Claudina Lick, MD for the evaluation and management of atrial fibrillation.  Pertinent history: Diagnosed with afib 01/2021. Denies symptoms. Attempted cardioversion, unable to convert. Her husband (now deceased 1 years) had afib, and she is familiar with it.   Today: Reviewed attempt at cardioversion 02/11/21, remained in afib. Feels fine. Can run up/down stairs, walk 3-4 miles/day without issue. Reviewed echo. Tolerating diltiazem. Discussed anticoagulants at length today. We discussed options, see below.  Denies chest pain, shortness of breath at rest or with normal exertion. No PND, orthopnea, LE edema or unexpected weight gain. No syncope or palpitations.  Past Medical History:  Diagnosis Date  . Benign breast disease 2000  . DJD (degenerative joint disease)   . Generalized headaches   . Heart murmur   . Hyperlipidemia   . Mitral click-murmur syndrome    nomitral  reguritation  . Osteopenia   . Temporal arteritis ([place])    2013-14    Past Surgical History:  Procedure Laterality Date  . APPENDECTOMY  1962  . ARTERY BIOPSY  09/20/2011   Procedure: BIOPSY TEMPORAL ARTERY;  Surgeon: Adin Hector, MD;  Location: [place];  Service: General;  Laterality: Left;  . BREAST BIOPSY  2000   Atypical Lobular Hyperplasia  . CARDIOVERSION N/A 02/11/2021   Procedure: CARDIOVERSION;  Surgeon: Buford Dresser, MD;  Location[facility] ENDOSCOPY;  Service: Cardiovascular;  Laterality: N/A;  . CATARACT EXTRACTION     bilaterally  . COLONOSCOPY  2002 & 2013   Dr Olevia Perches  . ELBOW  SURGERY  2009   lt fx  . ENDOSCOPIC TURBINATE REDUCTION Bilateral 10/06/2015   Procedure: ENDOSCOPIC TURBINATE REDUCTION;  Surgeon: Jerrell Belfast, MD;  Location: [place];  Service: ENT;  Laterality: Bilateral;  . EYE SURGERY    . FRACTURE SURGERY Bilateral    wrist  . JOINT REPLACEMENT  2001   both-knees  . SINUS ENDO W/FUSION Left 10/06/2015   Procedure: LEFT ENDOSCOPIC SINUS SURGERY WITH MAXILLARY ANTROSTOMY AND REMOVAL OF DISEASED TISSUE ;  Surgeon: Jerrell Belfast, MD;  Location: [place];  Service: ENT;  Laterality: Left;  . TOTAL KNEE ARTHROPLASTY     bilaterally    Current Medications: Current Outpatient Medications on File Prior to Visit  Medication Sig  . apixaban (ELIQUIS) 2.5 MG TABS tablet Take 1 tablet (2.5 mg total) by mouth 2 (two) times daily.  . Calcium Carbonate-Vitamin D (CALCIUM + D PO) Take 1,200 mg by mouth daily.  [place] Kitchen diltiazem (CARDIZEM CD) 120 MG 24 hr capsule Take 1 capsule (120 mg total) by mouth daily.  [place] Kitchen ipratropium (ATROVENT) 0.06 % nasal spray Place 2 sprays into both nostrils daily.  . Multiple Vitamins-Minerals (CENTRUM SILVER PO) Take 1 tablet by mouth daily.   No current facility-administered medications on file prior to visit.     Allergies:   Codeine   Social History   Tobacco Use  . Smoking status: Never Smoker  . Smokeless tobacco: Never Used  Substance Use Topics  . Alcohol  use: Yes    Alcohol/week: 7.0 standard drinks    Types: 7 Glasses of wine per week    Comment: Wine at dinner  . Drug use: No    Family History: family history includes Breast cancer in her mother; Cancer in her sister; Diabetes in her maternal aunt, maternal grandmother, mother, and sister; Heart attack in her mother; Heart attack (age of onset: 68) in her father; Heart disease in her sister; Osteoporosis in her mother. There is no history of Stroke.  ROS:   Please see the history of present illness.  Additional pertinent ROS otherwise unremarkable.     EKGs/Labs/Other Studies Reviewed:    The following studies were reviewed today: Echo 01/19/21 1. Left ventricular ejection fraction, by estimation, is 50 to 55%. The  left ventricle has normal function. The left ventricle has no regional  wall motion abnormalities. Diastolic function is indeterminant due to  atrial fibrillation.  2. Right ventricular systolic function is normal. The right ventricular  size is normal. There is normal pulmonary artery systolic pressure. The  estimated right ventricular systolic pressure is 06.2 mmHg.  3. Left atrial size was moderately dilated.  4. Right atrial size was severely dilated.  5. The mitral valve is degenerative. Trivial mitral valve regurgitation.  6. There is moderate, functional tricuspid regurgitation.  7. The aortic valve is tricuspid. Aortic valve regurgitation is mild.  8. The inferior vena cava is normal in size with greater than 50%  respiratory variability, suggesting right atrial pressure of 3 mmHg.   EKG:  EKG is personally reviewed.  The ekg ordered today demonstrates atrial fibrillation at 86 bpm, RBBB  Recent Labs: 01/12/2021: ALT 29; BUN 27; Creatinine, Ser 0.70; Hemoglobin 15.1; Platelets 182.0; Potassium 4.4; Sodium 139; TSH 4.56  Recent Lipid Panel    Component Value Date/Time   CHOL 200 01/12/2021 1437   TRIG 61.0 01/12/2021 1437   HDL 84.10 01/12/2021 1437   CHOLHDL 2 01/12/2021 1437   VLDL 12.2 01/12/2021 1437   LDLCALC 104 (H) 01/12/2021 1437   LDLDIRECT 113.4 06/28/2011 1401    Physical Exam:    VS:  BP 108/60 (BP Location: Left Arm, Patient Position: Sitting, Cuff Size: Normal)   Pulse 86   Ht 5' 1" (1.549 m)   Wt 103 lb (46.7 kg)   BMI 19.46 kg/m     Wt Readings from Last 3 Encounters:  04/07/21 103 lb (46.7 kg)  02/11/21 100 lb (45.4 kg)  01/18/21 103 lb (46.7 kg)    GEN: Well nourished, well developed in no acute distress HEENT: Normal, moist mucous membranes NECK: No JVD CARDIAC:  irregularly irregular rhythm, normal S1 and S2, no rubs or gallops. No murmur. VASCULAR: Radial and DP pulses 2+ bilaterally. No carotid bruits RESPIRATORY:  Clear to auscultation without rales, wheezing or rhonchi  ABDOMEN: Soft, non-tender, non-distended MUSCULOSKELETAL:  Ambulates independently SKIN: Warm and dry, no edema NEUROLOGIC:  Alert and oriented x 3. No focal neuro deficits noted. PSYCHIATRIC:  Normal affect   ASSESSMENT:    1. Permanent atrial fibrillation (HCC)   2. Cardiac risk counseling   3. Counseling on health promotion and disease prevention   4. Current use of long term anticoagulation    PLAN:    Atrial fibrillation, permanent -echo as above. Low normal EF, biatrial enlargement. -did not convert with cardioversion. She does not wish to attempt rhythm control, either with change in medications, reattempted cardioversion, or ablation. Thus, she is permanent atrial fibrillation -tolerating low dose  long acting dilitazem.  -CHA2DS2/VAS Stroke Risk Points= 3 -she agrees with continuation of apixaban, despite cost. We did discuss alternatives (coumadin) vs DOACs, and she and I both prefer DOAC -she will contact me if she develops symptoms/limitations.  Cardiac risk counseling and prevention recommendations: -recommend heart healthy/Mediterranean diet, with whole grains, fruits, vegetable, fish, lean meats, nuts, and olive oil. Limit salt. -recommend moderate walking, 3-5 times/week for 30-50 minutes each session. Aim for at least 150 minutes.week. Goal should be pace of 3 miles/hours, or walking 1.5 miles in 30 minutes -recommend avoidance of tobacco products. Avoid excess alcohol.  Plan for follow up: 1 year or sooner if needed  Buford Dresser, MD, PhD, [facility]    Medication Adjustments/Labs and Tests Ordered: Current medicines are reviewed at length with the patient today.  Concerns regarding medicines are outlined above.   Orders Placed This Encounter  Procedures  . EKG 12-Lead   No orders of the defined types were placed in this encounter.   Patient Instructions  Medication Instructions:  Your Physician recommend you continue on your current medication as directed.    *If you need a refill on your cardiac medications before your next appointment, please call your pharmacy*   Lab Work: None ordered today   Testing/Procedures: None ordered today   Follow-Up: At [facility], you and your health needs are our priority.  As part of our continuing mission to provide you with exceptional heart care, we have created designated Provider Care Teams.  These Care Teams include your primary Cardiologist (physician) and Advanced Practice Providers (APPs -  Physician Assistants and Nurse Practitioners) who all work together to provide you with the care you need, when you need it.  We recommend signing up for the patient portal called "MyChart".  Sign up information is provided on this After Visit Summary.  MyChart is used to connect with patients for Virtual Visits (Telemedicine).  Patients are able to view lab/test results, encounter notes, upcoming appointments, etc.  Non-urgent messages can be sent to your provider as well.   To learn more about what you can do with MyChart, go to [email or web address].    Your next appointment:   1 year(s) @ [address] [place], [place] [address]   The format for your next appointment:   In Person  Provider:   Buford Dresser, MD       Signed, Buford Dresser, MD PhD 04/07/2021  [place]

## 2021-06-06 ENCOUNTER — Other Ambulatory Visit: Payer: Self-pay | Admitting: Internal Medicine

## 2021-06-06 DIAGNOSIS — Z1231 Encounter for screening mammogram for malignant neoplasm of breast: Secondary | ICD-10-CM

## 2021-07-18 DIAGNOSIS — Z23 Encounter for immunization: Secondary | ICD-10-CM | POA: Diagnosis not present

## 2021-07-26 ENCOUNTER — Other Ambulatory Visit: Payer: Self-pay

## 2021-07-26 ENCOUNTER — Ambulatory Visit
Admission: RE | Admit: 2021-07-26 | Discharge: 2021-07-26 | Disposition: A | Discharge status: Home / Self Care | Payer: Medicare Other | Source: Ambulatory Visit | Attending: Internal Medicine | Admitting: Internal Medicine

## 2021-07-26 DIAGNOSIS — Z1231 Encounter for screening mammogram for malignant neoplasm of breast: Secondary | ICD-10-CM | POA: Diagnosis not present

## 2021-09-23 ENCOUNTER — Ambulatory Visit: Payer: Medicare Other | Admitting: Nurse Practitioner

## 2021-10-12 ENCOUNTER — Ambulatory Visit: Payer: Medicare Other | Admitting: Emergency Medicine

## 2021-10-16 NOTE — Progress Notes (Signed)
Subjective:    Patient ID: Danielle Mcgee, female    DOB: [birth year], 87 y.o.   MRN: [id number]  This visit occurred during the SARS-CoV-2 public health emergency.  Safety protocols were in place, including screening questions prior to the visit, additional usage of staff PPE, and extensive cleaning of exam room while observing appropriate contact time as indicated for disinfecting solutions.    HPI The patient is here for an acute visit.  Symptoms started mid October.  Her symptoms are better, but she still has PND and she is concerned she may have a persistent infection.  She has intermittent throat irritation and hoarseness.  She sings at church and that has been more difficult because of the drainage and symptoms.  She tends to have some chronic sinus issues/allergies, but nothing that has lasted this long and then to this degree.  She has tried everything and nothing seems to be working.  She has done sinus rinses, flonase, gargled, cough drops, anti-histamines.    Medications and allergies reviewed with patient and updated if appropriate.  Patient Active Problem List   Diagnosis Date Noted   Chronic sinusitis 10/17/2021   Persistent atrial fibrillation ([place])    Irregular heart rhythm 01/12/2021   Rhinorrhea 01/12/2021   New onset a-fib ([place]) 01/12/2021   Elevated TSH 01/11/2021   Onychomycosis of toenail 06/16/2019   Mixed stress and urge urinary incontinence 06/11/2017   Hyperglycemia 06/10/2017   Paresthesia and pain of right extremity 06/16/2016   Mild cardiomegaly 09/04/2015   Perennial allergic rhinitis 03/13/2014   Temporal arteritis ([place]) 09/20/2011   Hyperlipidemia 04/19/2009   Osteoporosis 04/19/2009    Current Outpatient Medications on File Prior to Visit  Medication Sig Dispense Refill   apixaban (ELIQUIS) 2.5 MG TABS tablet Take 1 tablet (2.5 mg total) by mouth 2 (two) times daily. 180 tablet 3   Calcium Carbonate-Vitamin D (CALCIUM + D PO) Take 1,200  mg by mouth daily.     diltiazem (CARDIZEM CD) 120 MG 24 hr capsule Take 1 capsule (120 mg total) by mouth daily. 90 capsule 3   ipratropium (ATROVENT) 0.06 % nasal spray Place 2 sprays into both nostrils daily. 15 mL 12   Multiple Vitamins-Minerals (CENTRUM SILVER PO) Take 1 tablet by mouth daily.     No current facility-administered medications on file prior to visit.    Past Medical History:  Diagnosis Date   Benign breast disease 2000   DJD (degenerative joint disease)    Generalized headaches    Heart murmur    Hyperlipidemia    Mitral click-murmur syndrome    nomitral  reguritation   Osteopenia    Temporal arteritis ([place])    2013-14    Past Surgical History:  Procedure Laterality Date   APPENDECTOMY  1962   ARTERY BIOPSY  09/20/2011   Procedure: BIOPSY TEMPORAL ARTERY;  Surgeon: Adin Hector, MD;  Location: [place];  Service: General;  Laterality: Left;   BREAST BIOPSY  2000   Atypical Lobular Hyperplasia   CARDIOVERSION N/A 02/11/2021   Procedure: CARDIOVERSION;  Surgeon: Buford Dresser, MD;  Location: [place];  Service: Cardiovascular;  Laterality: N/A;   CATARACT EXTRACTION     bilaterally   COLONOSCOPY  2002 & 2013   Dr Olevia Perches   ELBOW SURGERY  2009   lt fx   ENDOSCOPIC TURBINATE REDUCTION Bilateral 10/06/2015   Procedure: ENDOSCOPIC TURBINATE REDUCTION;  Surgeon: Jerrell Belfast, MD;  Location: [place];  Service: ENT;  Laterality:  Bilateral;   EYE SURGERY     FRACTURE SURGERY Bilateral    wrist   JOINT REPLACEMENT  2001   both-knees   SINUS ENDO W/FUSION Left 10/06/2015   Procedure: LEFT ENDOSCOPIC SINUS SURGERY WITH MAXILLARY ANTROSTOMY AND REMOVAL OF DISEASED TISSUE ;  Surgeon: Jerrell Belfast, MD;  Location: [place];  Service: ENT;  Laterality: Left;   TOTAL KNEE ARTHROPLASTY     bilaterally    Social History   Socioeconomic History   Marital status: Widowed    Spouse name: Not on file   Number of children: 4   Years of education: College    Highest education level: Not on file  Occupational History   Occupation: Retired  Tobacco Use   Smoking status: Never   Smokeless tobacco: Never  Substance and Sexual Activity   Alcohol use: Yes    Alcohol/week: 7.0 standard drinks    Types: 7 Glasses of wine per week    Comment: Wine at dinner   Drug use: No   Sexual activity: Not on file  Other Topics Concern   Not on file  Social History Narrative   Lives at home alone   Right-handed   Caffeine: 1-3 Coke/tea   Exercise: walking regularly   Social Determinants of Health   Financial Resource Strain: Not on file  Food Insecurity: Not on file  Transportation Needs: Not on file  Physical Activity: Not on file  Stress: Not on file  Social Connections: Not on file    Family History  Problem Relation Age of Onset   Breast cancer Mother    Heart attack Mother        early 31s   Osteoporosis Mother    Diabetes Mother    Heart attack Father 51   Heart disease Sister        CABG in late 65s   Cancer Sister        CERVICAL   Diabetes Sister    Diabetes Maternal Aunt        X2   Diabetes Maternal Grandmother    Stroke Neg Hx     Review of Systems  Constitutional:  Negative for chills and fever.  HENT:  Positive for postnasal drip and voice change. Negative for congestion, ear pain, sinus pressure, sinus pain and sore throat.   Respiratory:  Negative for cough, shortness of breath and wheezing.   Neurological:  Negative for dizziness and headaches.      Objective:   Vitals:   10/17/21 1042  BP: 104/76  Pulse: 80  Temp: 97.8 F (36.6 C)  SpO2: 94%   BP Readings from Last 3 Encounters:  10/17/21 104/76  04/07/21 108/60  02/11/21 (!) 134/59   Wt Readings from Last 3 Encounters:  10/17/21 105 lb (47.6 kg)  04/07/21 103 lb (46.7 kg)  02/11/21 100 lb (45.4 kg)   Body mass index is 19.84 kg/m.   Physical Exam    GENERAL APPEARANCE: Appears stated age, well appearing, NAD EYES: conjunctiva clear, no  icterus HENT: bilateral tympanic membranes and ear canals normal, oropharynx with no erythema or exudates, trachea midline, no cervical or supraclavicular lymphadenopathy LUNGS: Unlabored breathing, good air entry bilaterally, clear to auscultation without wheeze or crackles CARDIOVASCULAR: Normal S1,S2 , no edema SKIN: Warm, dry      Assessment & Plan:    See Problem List for Assessment and Plan of chronic medical problems.

## 2021-10-17 ENCOUNTER — Other Ambulatory Visit: Payer: Self-pay

## 2021-10-17 ENCOUNTER — Ambulatory Visit (INDEPENDENT_AMBULATORY_CARE_PROVIDER_SITE_OTHER): Payer: Medicare Other | Admitting: Internal Medicine

## 2021-10-17 ENCOUNTER — Telehealth: Payer: Self-pay

## 2021-10-17 ENCOUNTER — Encounter: Payer: Self-pay | Admitting: Internal Medicine

## 2021-10-17 VITALS — BP 104/76 | HR 80 | Temp 97.8°F | Ht 61.0 in | Wt 105.0 lb

## 2021-10-17 DIAGNOSIS — J329 Chronic sinusitis, unspecified: Secondary | ICD-10-CM

## 2021-10-17 MED ORDER — AMOXICILLIN-POT CLAVULANATE 875-125 MG PO TABS: 1.0000 | ORAL_TABLET | Freq: Two times a day (BID) | ORAL | 0 refills | Status: AC

## 2021-10-17 NOTE — Telephone Encounter (Signed)
Spoke with patient,   Explained Eliquis PAP program  - patient will gather income information / pharmacy expenses for the year to be able to see if she would qualify for program  Another option would be [employer] - this would be for Xarelto and would lower copay to $85 for 30 days / $200 for 90 days   Patient declined CCM services at this time, aware to reach out should she decide she would like to enroll in program   Patient aware to call office / mychart message for updates regarding eliquis PAP program  Tomasa Blase, PharmD Clinical Pharmacist, [facility]

## 2021-10-17 NOTE — Assessment & Plan Note (Signed)
Chronic sinusitis More than her typical allergies or sinus issues and it started getting infection so concern for mild Chronic sinus infection Will start Augmentin 875-125 mg twice daily x7 days Advised to start Claritin or other over-the-counter antihistamine and take daily

## 2021-10-17 NOTE — Patient Instructions (Addendum)
     Medications changes include :   Augmentin 875-125 mg twice daily for 7 days  Start Claritin daily for the drainage.     Your prescription(s) have been submitted to your pharmacy. Please take as directed and contact our office if you believe you are having problem(s) with the medication(s).

## 2021-12-26 NOTE — Progress Notes (Signed)
Subjective:    Patient ID: Danielle Mcgee, female    DOB: 1930-07-11, 88 y.o.   MRN: [id number]  This visit occurred during the SARS-CoV-2 public health emergency.  Safety protocols were in place, including screening questions prior to the visit, additional usage of staff PPE, and extensive cleaning of exam room while observing appropriate contact time as indicated for disinfecting solutions.    HPI She is here for an acute visit for cold symptoms.   Her symptoms started back in October.  It really started with a very bad cold and she had a scratchy throat at that point.  Her symptoms never really completely went away.  She never took anything for it and has not been taking anything over-the-counter recently.  She is experiencing hoarseness, which is the most concerning thing.  She does have some mild nasal congestion and postnasal drip.  She has seen some discolored mucus from the drainage going down her throat on occasion when she is able to spit it up.  She has a intermittent cough that is primarily dry-occasionally she can bring up some sputum.  She has not had any fevers, shortness of breath, wheezing chest tightness or sinus pain.      Medications and allergies reviewed with patient and updated if appropriate.  Patient Active Problem List   Diagnosis Date Noted   Chronic sinusitis 10/17/2021   Persistent atrial fibrillation ([place])    Irregular heart rhythm 01/12/2021   Rhinorrhea 01/12/2021   New onset a-fib ([place]) 01/12/2021   Elevated TSH 01/11/2021   Onychomycosis of toenail 06/16/2019   Mixed stress and urge urinary incontinence 06/11/2017   Hyperglycemia 06/10/2017   Paresthesia and pain of right extremity 06/16/2016   Mild cardiomegaly 09/04/2015   Perennial allergic rhinitis 03/13/2014   Temporal arteritis ([place]) 09/20/2011   Hyperlipidemia 04/19/2009   Osteoporosis 04/19/2009    Current Outpatient Medications on File Prior to Visit  Medication Sig Dispense  Refill   apixaban (ELIQUIS) 2.5 MG TABS tablet Take 1 tablet (2.5 mg total) by mouth 2 (two) times daily. 180 tablet 3   Calcium Carbonate-Vitamin D (CALCIUM + D PO) Take 1,200 mg by mouth daily.     diltiazem (CARDIZEM CD) 120 MG 24 hr capsule Take 1 capsule (120 mg total) by mouth daily. 90 capsule 3   ipratropium (ATROVENT) 0.06 % nasal spray Place 2 sprays into both nostrils daily. 15 mL 12   Multiple Vitamins-Minerals (CENTRUM SILVER PO) Take 1 tablet by mouth daily.     No current facility-administered medications on file prior to visit.    Past Medical History:  Diagnosis Date   Benign breast disease 2000   DJD (degenerative joint disease)    Generalized headaches    Heart murmur    Hyperlipidemia    Mitral click-murmur syndrome    nomitral  reguritation   Osteopenia    Temporal arteritis ([place])    2013-14    Past Surgical History:  Procedure Laterality Date   APPENDECTOMY  1962   ARTERY BIOPSY  09/20/2011   Procedure: BIOPSY TEMPORAL ARTERY;  Surgeon: Adin Hector, MD;  Location: [facility];  Service: General;  Laterality: Left;   BREAST BIOPSY  2000   Atypical Lobular Hyperplasia   CARDIOVERSION N/A 02/11/2021   Procedure: CARDIOVERSION;  Surgeon: Buford Dresser, MD;  Location: [facility];  Service: Cardiovascular;  Laterality: N/A;   CATARACT EXTRACTION     bilaterally   COLONOSCOPY  2002 & 2013   Dr  Brodie   ELBOW SURGERY  2009   lt fx   ENDOSCOPIC TURBINATE REDUCTION Bilateral 10/06/2015   Procedure: ENDOSCOPIC TURBINATE REDUCTION;  Surgeon: Jerrell Belfast, MD;  Location: [facility];  Service: ENT;  Laterality: Bilateral;   EYE SURGERY     FRACTURE SURGERY Bilateral    wrist   JOINT REPLACEMENT  2001   both-knees   SINUS ENDO W/FUSION Left 10/06/2015   Procedure: LEFT ENDOSCOPIC SINUS SURGERY WITH MAXILLARY ANTROSTOMY AND REMOVAL OF DISEASED TISSUE ;  Surgeon: Jerrell Belfast, MD;  Location: [facility];  Service: ENT;  Laterality: Left;   TOTAL KNEE  ARTHROPLASTY     bilaterally    Social History   Socioeconomic History   Marital status: Widowed    Spouse name: Not on file   Number of children: 4   Years of education: College   Highest education level: Not on file  Occupational History   Occupation: Retired  Tobacco Use   Smoking status: Never   Smokeless tobacco: Never  Substance and Sexual Activity   Alcohol use: Yes    Alcohol/week: 7.0 standard drinks    Types: 7 Glasses of wine per week    Comment: Wine at dinner   Drug use: No   Sexual activity: Not on file  Other Topics Concern   Not on file  Social History Narrative   Lives at home alone   Right-handed   Caffeine: 1-3 Coke/tea   Exercise: walking regularly   Social Determinants of Health   Financial Resource Strain: Not on file  Food Insecurity: Not on file  Transportation Needs: Not on file  Physical Activity: Not on file  Stress: Not on file  Social Connections: Not on file    Family History  Problem Relation Age of Onset   Breast cancer Mother    Heart attack Mother        early 61s   Osteoporosis Mother    Diabetes Mother    Heart attack Father 44   Heart disease Sister        CABG in late 76s   Cancer Sister        CERVICAL   Diabetes Sister    Diabetes Maternal Aunt        X2   Diabetes Maternal Grandmother    Stroke Neg Hx     Review of Systems  Constitutional:  Negative for chills and fever.  HENT:  Positive for congestion (mild), postnasal drip (occ green) and voice change. Negative for ear pain, sinus pressure, sinus pain and sore throat.   Respiratory:  Positive for cough (if she takes deep breath - occ sputum). Negative for chest tightness, shortness of breath and wheezing.   Neurological:  Negative for dizziness and headaches.      Objective:   Vitals:   12/27/21 1551  BP: 108/78  Pulse: 94  Temp: 97.8 F (36.6 C)  SpO2: 96%   BP Readings from Last 3 Encounters:  12/27/21 108/78  10/17/21 104/76  04/07/21 108/60    Wt Readings from Last 3 Encounters:  12/27/21 104 lb 12.8 oz (47.5 kg)  10/17/21 105 lb (47.6 kg)  04/07/21 103 lb (46.7 kg)   Body mass index is 19.8 kg/m.   Physical Exam Constitutional:      General: She is not in acute distress.    Appearance: Normal appearance. She is not ill-appearing.     Comments: Mild hoarseness  HENT:     Head: Normocephalic and atraumatic.  Right Ear: Tympanic membrane, ear canal and external ear normal.     Left Ear: Tympanic membrane, ear canal and external ear normal.     Mouth/Throat:     Mouth: Mucous membranes are moist.     Pharynx: No oropharyngeal exudate or posterior oropharyngeal erythema.  Eyes:     Conjunctiva/sclera: Conjunctivae normal.  Cardiovascular:     Rate and Rhythm: Normal rate and regular rhythm.  Pulmonary:     Effort: Pulmonary effort is normal. No respiratory distress.     Breath sounds: Normal breath sounds. No wheezing or rales.  Musculoskeletal:     Cervical back: Neck supple. No tenderness.  Lymphadenopathy:     Cervical: No cervical adenopathy.  Skin:    General: Skin is warm and dry.  Neurological:     Mental Status: She is alert.           Assessment & Plan:    See Problem List for Assessment and Plan of chronic medical problems.

## 2021-12-27 ENCOUNTER — Encounter: Payer: Self-pay | Admitting: Internal Medicine

## 2021-12-27 ENCOUNTER — Ambulatory Visit (INDEPENDENT_AMBULATORY_CARE_PROVIDER_SITE_OTHER): Payer: Medicare Other | Admitting: Internal Medicine

## 2021-12-27 ENCOUNTER — Other Ambulatory Visit: Payer: Self-pay

## 2021-12-27 VITALS — BP 108/78 | HR 94 | Temp 97.8°F | Ht 61.0 in | Wt 104.8 lb

## 2021-12-27 DIAGNOSIS — J320 Chronic maxillary sinusitis: Secondary | ICD-10-CM

## 2021-12-27 MED ORDER — AMOXICILLIN-POT CLAVULANATE 875-125 MG PO TABS: 1.0000 | ORAL_TABLET | Freq: Two times a day (BID) | ORAL | 0 refills | Status: DC

## 2021-12-27 NOTE — Assessment & Plan Note (Signed)
Acute Concern for chronic sinus infection causing the drainage, mild congestion, intermittent cough and hoarseness Has not felt that any of the over-the-counter medications have been effective and is no longer taking them Start Augmentin 875-125 mg twice daily for 10 days Discussed that if this does not help we can consider ENT referral for short course of steroids to see if that helps with the hoarseness and drainage-advised her to call and let me know if her symptoms are not resolving

## 2021-12-27 NOTE — Patient Instructions (Signed)
° ° °  Take the antibiotic as prescribed - complete the entire course.   ° ° °Continue over the counter cold medication, advil and tylenol.  Increase your fluids and rest.  ° ° °Call if no improvement   ° °

## 2021-12-29 ENCOUNTER — Other Ambulatory Visit: Payer: Self-pay | Admitting: Cardiology

## 2021-12-29 DIAGNOSIS — I4819 Other persistent atrial fibrillation: Secondary | ICD-10-CM

## 2021-12-29 NOTE — Telephone Encounter (Signed)
Rx(s) sent to pharmacy electronically.  

## 2022-01-12 DIAGNOSIS — Z85828 Personal history of other malignant neoplasm of skin: Secondary | ICD-10-CM | POA: Diagnosis not present

## 2022-01-12 DIAGNOSIS — L821 Other seborrheic keratosis: Secondary | ICD-10-CM | POA: Diagnosis not present

## 2022-01-12 DIAGNOSIS — L570 Actinic keratosis: Secondary | ICD-10-CM | POA: Diagnosis not present

## 2022-01-13 DIAGNOSIS — L570 Actinic keratosis: Secondary | ICD-10-CM | POA: Diagnosis not present

## 2022-01-13 DIAGNOSIS — M7981 Nontraumatic hematoma of soft tissue: Secondary | ICD-10-CM | POA: Diagnosis not present

## 2022-01-13 DIAGNOSIS — L821 Other seborrheic keratosis: Secondary | ICD-10-CM | POA: Diagnosis not present

## 2022-01-13 DIAGNOSIS — L590 Erythema ab igne [dermatitis ab igne]: Secondary | ICD-10-CM | POA: Diagnosis not present

## 2022-01-13 DIAGNOSIS — Z85828 Personal history of other malignant neoplasm of skin: Secondary | ICD-10-CM | POA: Diagnosis not present

## 2022-01-13 DIAGNOSIS — D1801 Hemangioma of skin and subcutaneous tissue: Secondary | ICD-10-CM | POA: Diagnosis not present

## 2022-02-07 ENCOUNTER — Encounter: Payer: Self-pay | Admitting: Internal Medicine

## 2022-02-07 NOTE — Progress Notes (Signed)
? ? ?Subjective:  ? ? Patient ID: Danielle Mcgee, female    DOB: 11/15/1929, 88 y.o.   MRN: [id number] ? ?This visit occurred during the SARS-CoV-2 public health emergency.  Safety protocols were in place, including screening questions prior to the visit, additional usage of staff PPE, and extensive cleaning of exam room while observing appropriate contact time as indicated for disinfecting solutions. ? ? ? ?HPI ?Danielle Mcgee is here for  ?Chief Complaint  ?Patient presents with  ? Cough  ? ? ? ?Her symptoms started about one week ago and has gotten worse ? ? ?She is experiencing nasal congestion, PND and cough that is productive of yellow sputum and violent in nature.  He is most concerned about the cough and the fact that her symptoms are not improving.   ? ? ?She has tried taking benadryl, nyquil.  She is having difficulty sleeping because of the drainage and cough. ? ? ? ? ? ?Medications and allergies reviewed with patient and updated if appropriate. ? ?Current Outpatient Medications on File Prior to Visit  ?Medication Sig Dispense Refill  ? apixaban (ELIQUIS) 2.5 MG TABS tablet Take 1 tablet (2.5 mg total) by mouth 2 (two) times daily. 180 tablet 3  ? Calcium Carbonate-Vitamin D (CALCIUM + D PO) Take 1,200 mg by mouth daily.    ? diltiazem (CARDIZEM CD) 120 MG 24 hr capsule TAKE 1 CAPSULE BY MOUTH EVERY DAY 90 capsule 1  ? ipratropium (ATROVENT) 0.06 % nasal spray Place 2 sprays into both nostrils daily. 15 mL 12  ? Multiple Vitamins-Minerals (CENTRUM SILVER PO) Take 1 tablet by mouth daily.    ? ?No current facility-administered medications on file prior to visit.  ? ? ?Review of Systems  ?Constitutional:  Negative for chills and fever.  ?HENT:  Positive for congestion (minima;) and postnasal drip. Negative for ear pain (popping), sinus pressure, sinus pain and sore throat (resolved).   ?Respiratory:  Positive for cough (hard, violent productive of yellow mucus). Negative for shortness of breath and wheezing.    ?Gastrointestinal:  Negative for abdominal pain and nausea.  ?Musculoskeletal:  Negative for myalgias.  ?Neurological:  Negative for dizziness, light-headedness and headaches.  ? ?   ?Objective:  ? ?Vitals:  ? 02/08/22 1003  ?BP: 110/74  ?Pulse: 80  ?Temp: 98.2 ?F (36.8 ?C)  ?SpO2: 96%  ? ?BP Readings from Last 3 Encounters:  ?02/08/22 110/74  ?12/27/21 108/78  ?10/17/21 104/76  ? ?Wt Readings from Last 3 Encounters:  ?02/08/22 102 lb (46.3 kg)  ?12/27/21 104 lb 12.8 oz (47.5 kg)  ?10/17/21 105 lb (47.6 kg)  ? ?Body mass index is 19.27 kg/m?. ? ?  ?Physical Exam ?Constitutional:   ?   General: She is not in acute distress. ?   Appearance: Normal appearance. She is not ill-appearing.  ?HENT:  ?   Head: Normocephalic and atraumatic.  ?   Right Ear: Tympanic membrane, ear canal and external ear normal.  ?   Left Ear: Tympanic membrane, ear canal and external ear normal.  ?   Mouth/Throat:  ?   Mouth: Mucous membranes are moist.  ?   Pharynx: No oropharyngeal exudate or posterior oropharyngeal erythema.  ?Eyes:  ?   Conjunctiva/sclera: Conjunctivae normal.  ?Cardiovascular:  ?   Rate and Rhythm: Normal rate and regular rhythm.  ?Pulmonary:  ?   Effort: Pulmonary effort is normal. No respiratory distress.  ?   Breath sounds: Normal breath sounds. No wheezing or rales.  ?  Musculoskeletal:  ?   Cervical back: Neck supple. No tenderness.  ?Lymphadenopathy:  ?   Cervical: No cervical adenopathy.  ?Skin: ?   General: Skin is warm and dry.  ?Neurological:  ?   Mental Status: She is alert.  ? ?   ? ? ? ? ? ?Assessment & Plan:  ? ? ?See Problem List for Assessment and Plan of chronic medical problems.  ? ? ? ? ?

## 2022-02-08 ENCOUNTER — Ambulatory Visit (INDEPENDENT_AMBULATORY_CARE_PROVIDER_SITE_OTHER): Payer: Medicare Other | Admitting: Internal Medicine

## 2022-02-08 DIAGNOSIS — J069 Acute upper respiratory infection, unspecified: Secondary | ICD-10-CM | POA: Diagnosis not present

## 2022-02-08 MED ORDER — HYDROCODONE BIT-HOMATROP MBR 5-1.5 MG/5ML PO SOLN: 5.0000 mL | Freq: Three times a day (TID) | ORAL | 0 refills | Status: DC | PRN

## 2022-02-08 MED ORDER — AZITHROMYCIN 250 MG PO TABS: ORAL_TABLET | ORAL | 0 refills | Status: DC

## 2022-02-08 NOTE — Patient Instructions (Addendum)
? ? ? ? ?  Medications changes include :   zpak and cough syrup for nighttime. ? ? ?Your prescription(s) have been sent to your pharmacy.  ? ? ? ? ?Return if symptoms worsen or fail to improve. ? ?

## 2022-02-08 NOTE — Assessment & Plan Note (Signed)
Acute ?Concern for possible bacterial cause ?Start Z-Pak ?Start Hycodan cough syrup 5 mils every 8 hours as needed-advised this would cause drowsiness and best to take at night ?Rest, fluids ?She will call if her symptoms or not improving or with any concerns ?

## 2022-03-02 DIAGNOSIS — Z23 Encounter for immunization: Secondary | ICD-10-CM | POA: Diagnosis not present

## 2022-03-10 ENCOUNTER — Ambulatory Visit (INDEPENDENT_AMBULATORY_CARE_PROVIDER_SITE_OTHER): Payer: Medicare Other | Admitting: Internal Medicine

## 2022-03-10 ENCOUNTER — Ambulatory Visit (INDEPENDENT_AMBULATORY_CARE_PROVIDER_SITE_OTHER): Payer: Medicare Other

## 2022-03-10 ENCOUNTER — Encounter: Payer: Self-pay | Admitting: Internal Medicine

## 2022-03-10 VITALS — BP 110/68 | HR 86 | Temp 98.2°F | Ht 61.0 in | Wt 105.4 lb

## 2022-03-10 DIAGNOSIS — R052 Subacute cough: Secondary | ICD-10-CM | POA: Diagnosis not present

## 2022-03-10 DIAGNOSIS — I4819 Other persistent atrial fibrillation: Secondary | ICD-10-CM

## 2022-03-10 DIAGNOSIS — R051 Acute cough: Secondary | ICD-10-CM | POA: Insufficient documentation

## 2022-03-10 DIAGNOSIS — R300 Dysuria: Secondary | ICD-10-CM

## 2022-03-10 DIAGNOSIS — R058 Other specified cough: Secondary | ICD-10-CM | POA: Insufficient documentation

## 2022-03-10 DIAGNOSIS — R310 Gross hematuria: Secondary | ICD-10-CM

## 2022-03-10 DIAGNOSIS — R059 Cough, unspecified: Secondary | ICD-10-CM | POA: Diagnosis not present

## 2022-03-10 LAB — POC URINALSYSI DIPSTICK (AUTOMATED)
Bilirubin, UA: NEGATIVE
Glucose, UA: NEGATIVE
Ketones, UA: NEGATIVE
Leukocytes, UA: NEGATIVE
Nitrite, UA: NEGATIVE
Protein, UA: POSITIVE — AB
Spec Grav, UA: >=1.03 — AB (ref 1.010–1.025)
Urobilinogen, UA: 0.2 E.U./dL
pH, UA: 5.5 (ref 5.0–8.0)

## 2022-03-10 MED ORDER — BENZONATATE 100 MG PO CAPS: 100.0000 mg | ORAL_CAPSULE | Freq: Three times a day (TID) | ORAL | 0 refills | Status: DC | PRN

## 2022-03-10 MED ORDER — CEPHALEXIN 500 MG PO CAPS: 500.0000 mg | ORAL_CAPSULE | Freq: Two times a day (BID) | ORAL | 0 refills | Status: DC

## 2022-03-10 NOTE — Assessment & Plan Note (Signed)
Acute ?Started this morning ?May be associated with some slight increase in urination-is difficult for her to tell because she does wear a pad for incontinence ?Urine dip done ?Possible UTI-we will start Keflex 500 mg twice daily x1 week ?Send urine for culture ?The amount of bleeding has improved so we will continue Eliquis for now unless it worsens ?We will get a KUB just to see if there could be some stones ?If hematuria persists or urine culture is negative will need imaging/further evaluation ?

## 2022-03-10 NOTE — Assessment & Plan Note (Signed)
Subacute ?Persistent since her upper respiratory infection recently ?No other concerning symptoms-just a dry cough ?Possibly related to some drainage-she has tried Benadryl ?Likely residual cough but hopefully will improve with time or not related to allergies, but will go ahead and get a chest x-ray to make sure it is clear ?Trial of Tessalon 100 mg 3 times daily as needed ?

## 2022-03-10 NOTE — Assessment & Plan Note (Signed)
Chronic ?Following with cardiology ?Currently on Eliquis 2.5 mg twice daily and diltiazem 120 mg daily ?Today she had an episode of gross hematuria and still has microscopic blood in her urine, but no longer gross hematuria ?Possible UTI ?Since bleeding is minimal at this time we will continue Eliquis at current dose, but if it worsens may need to hold temporarily ?

## 2022-03-10 NOTE — Patient Instructions (Addendum)
? ? ? ?  Have a chest xray downstairs. ? ? ?Medications changes include :   keflex for your urine infection, tessalon cough pills  ? ? ?Your prescription(s) have been sent to your pharmacy.  ? ? ?Return if symptoms worsen or fail to improve. ? ?

## 2022-03-10 NOTE — Progress Notes (Signed)
? ? ?Subjective:  ? ? Patient ID: Danielle Mcgee, female    DOB: 10/07/1930, 88 y.o.   MRN: [id number] ? ?This visit occurred during the SARS-CoV-2 public health emergency.  Safety protocols were in place, including screening questions prior to the visit, additional usage of staff PPE, and extensive cleaning of exam room while observing appropriate contact time as indicated for disinfecting solutions. ? ? ? ?HPI ?Danielle Mcgee is here for  ?Chief Complaint  ?Patient presents with  ? Urinary Tract Infection  ? ? ?? UTI:  Her symptoms started  today.  She had blood in her urine and it was on her PJ's.   She does wear pads for leakage at night.  She may have increased urinary frequency, but it is hard to tell.  She denies other urine symptoms.  She denies any abdominal pain, nausea or fevers.  Yesterday she did have a little right upper quadrant pain, but she thought that was related to work she had done out in the yard. ? ?She denies history of urinary tract infections-maybe she had 1 when she was younger.  She denies any history of renal stones..   ? ? ?She continues to have the dry cough for which she was here earlier.  She denies other symptoms-just a dry cough when she takes a big deep breath.  She may have a little bit of a tickle.  She has tried some Benadryl, but has not helped much.  Is more annoying than anything. ? ? ? ?Medications and allergies reviewed with patient and updated if appropriate. ? ?Current Outpatient Medications on File Prior to Visit  ?Medication Sig Dispense Refill  ? apixaban (ELIQUIS) 2.5 MG TABS tablet Take 1 tablet (2.5 mg total) by mouth 2 (two) times daily. 180 tablet 3  ? Calcium Carbonate-Vitamin D (CALCIUM + D PO) Take 1,200 mg by mouth daily.    ? diltiazem (CARDIZEM CD) 120 MG 24 hr capsule TAKE 1 CAPSULE BY MOUTH EVERY DAY 90 capsule 1  ? ipratropium (ATROVENT) 0.06 % nasal spray Place 2 sprays into both nostrils daily. 15 mL 12  ? Multiple Vitamins-Minerals (CENTRUM SILVER PO)  Take 1 tablet by mouth daily.    ? ?No current facility-administered medications on file prior to visit.  ? ? ?Review of Systems ? ?   ?Objective:  ? ?Vitals:  ? 03/10/22 1057  ?BP: 110/68  ?Pulse: 86  ?Temp: 98.2 ?F (36.8 ?C)  ?SpO2: 96%  ? ?BP Readings from Last 3 Encounters:  ?03/10/22 110/68  ?02/08/22 110/74  ?12/27/21 108/78  ? ?Wt Readings from Last 3 Encounters:  ?03/10/22 105 lb 6.4 oz (47.8 kg)  ?02/08/22 102 lb (46.3 kg)  ?12/27/21 104 lb 12.8 oz (47.5 kg)  ? ?Body mass index is 19.92 kg/m?. ? ?  ?Physical Exam ?Constitutional:   ?   General: She is not in acute distress. ?   Appearance: Normal appearance. She is not ill-appearing.  ?HENT:  ?   Head: Normocephalic.  ?Eyes:  ?   Conjunctiva/sclera: Conjunctivae normal.  ?Pulmonary:  ?   Effort: Pulmonary effort is normal. No respiratory distress.  ?   Breath sounds: Normal breath sounds. No wheezing or rales.  ?Abdominal:  ?   General: There is no distension.  ?   Palpations: Abdomen is soft.  ?   Tenderness: There is no abdominal tenderness. There is no right CVA tenderness, left CVA tenderness, guarding or rebound.  ?Skin: ?   General: Skin is warm  and dry.  ?Neurological:  ?   Mental Status: She is alert.  ? ?   ? ? ? ? ? ?Assessment & Plan:  ? ? ?See Problem List for Assessment and Plan of chronic medical problems.  ? ? ? ? ?

## 2022-03-15 LAB — CULTURE, URINE COMPREHENSIVE

## 2022-03-16 DIAGNOSIS — H5203 Hypermetropia, bilateral: Secondary | ICD-10-CM | POA: Diagnosis not present

## 2022-03-16 DIAGNOSIS — H26491 Other secondary cataract, right eye: Secondary | ICD-10-CM | POA: Diagnosis not present

## 2022-03-30 ENCOUNTER — Other Ambulatory Visit (HOSPITAL_BASED_OUTPATIENT_CLINIC_OR_DEPARTMENT_OTHER): Payer: Self-pay | Admitting: Cardiology

## 2022-03-31 NOTE — Telephone Encounter (Signed)
Prescription refill request for Eliquis received. Indication:Afib Last office visit:6/22 Scr:0.7 Age: 88 Weight:47.8 kg  Prescription refilled

## 2022-04-14 ENCOUNTER — Ambulatory Visit (INDEPENDENT_AMBULATORY_CARE_PROVIDER_SITE_OTHER): Payer: Medicare Other | Admitting: Cardiology

## 2022-04-14 ENCOUNTER — Encounter (HOSPITAL_BASED_OUTPATIENT_CLINIC_OR_DEPARTMENT_OTHER): Payer: Self-pay | Admitting: Cardiology

## 2022-04-14 VITALS — BP 116/74 | HR 73 | Ht 60.0 in | Wt 105.0 lb

## 2022-04-14 DIAGNOSIS — Z79899 Other long term (current) drug therapy: Secondary | ICD-10-CM

## 2022-04-14 DIAGNOSIS — D6869 Other thrombophilia: Secondary | ICD-10-CM

## 2022-04-14 DIAGNOSIS — Z7901 Long term (current) use of anticoagulants: Secondary | ICD-10-CM | POA: Diagnosis not present

## 2022-04-14 DIAGNOSIS — I4821 Permanent atrial fibrillation: Secondary | ICD-10-CM | POA: Diagnosis not present

## 2022-04-14 DIAGNOSIS — Z7189 Other specified counseling: Secondary | ICD-10-CM

## 2022-04-14 NOTE — Progress Notes (Signed)
Cardiology Office Note:    Date:  04/14/2022   ID:  Danielle Mcgee, DOB 10-17-1930, MRN [id number]  PCP:  Binnie Rail, MD  Cardiologist:  Buford Dresser, MD  Referring MD: Binnie Rail, MD   CC: follow up  History of Present Illness:    Danielle Mcgee is a 88 y.o. female with a hx of permanent atrial fibrillation on apixaban who is seen for follow up today. I initially met her as a new consult on 01/18/21 at the request of Burns, Claudina Lick, MD for the evaluation and management of atrial fibrillation.  Pertinent history: Diagnosed with afib 01/2021. Denies symptoms. Attempted cardioversion, unable to convert. Is asymptomatic, opted for rate control.  Today:  The patient doesn't have much she's worried about. She does not feel her Afib episodes.   She walks or runs up the stairs about 10 times a day. She does not feel physically limited.   She did have one instance of blood in her urine, but her primary care doctor could not find anything that would have caused it. She took an antibiotic as it was a presumed bladder infection. This has not recurred.  She denies any chest pain, shortness of breath, or peripheral edema. No lightheadedness, headaches, syncope, orthopnea, or PND.   Past Medical History:  Diagnosis Date   Benign breast disease 2000   DJD (degenerative joint disease)    Generalized headaches    Heart murmur    Hyperlipidemia    Mitral click-murmur syndrome    nomitral  reguritation   Osteopenia    Temporal arteritis ([place])    2013-14    Past Surgical History:  Procedure Laterality Date   APPENDECTOMY  1962   ARTERY BIOPSY  09/20/2011   Procedure: BIOPSY TEMPORAL ARTERY;  Surgeon: Adin Hector, MD;  Location: [place];  Service: General;  Laterality: Left;   BREAST BIOPSY  2000   Atypical Lobular Hyperplasia   CARDIOVERSION N/A 02/11/2021   Procedure: CARDIOVERSION;  Surgeon: Buford Dresser, MD;  Location: [place];  Service:  Cardiovascular;  Laterality: N/A;   CATARACT EXTRACTION     bilaterally   COLONOSCOPY  2002 & 2013   Dr Olevia Perches   ELBOW SURGERY  2009   lt fx   ENDOSCOPIC TURBINATE REDUCTION Bilateral 10/06/2015   Procedure: ENDOSCOPIC TURBINATE REDUCTION;  Surgeon: Jerrell Belfast, MD;  Location: [place];  Service: ENT;  Laterality: Bilateral;   EYE SURGERY     FRACTURE SURGERY Bilateral    wrist   JOINT REPLACEMENT  2001   both-knees   SINUS ENDO W/FUSION Left 10/06/2015   Procedure: LEFT ENDOSCOPIC SINUS SURGERY WITH MAXILLARY ANTROSTOMY AND REMOVAL OF DISEASED TISSUE ;  Surgeon: Jerrell Belfast, MD;  Location: [place];  Service: ENT;  Laterality: Left;   TOTAL KNEE ARTHROPLASTY     bilaterally    Current Medications: Current Outpatient Medications on File Prior to Visit  Medication Sig   Calcium Carbonate-Vitamin D (CALCIUM + D PO) Take 1,200 mg by mouth daily.   diltiazem (CARDIZEM CD) 120 MG 24 hr capsule TAKE 1 CAPSULE BY MOUTH EVERY DAY   ELIQUIS 2.5 MG TABS tablet TAKE 1 TABLET BY MOUTH TWICE A DAY   ipratropium (ATROVENT) 0.06 % nasal spray Place 2 sprays into both nostrils daily.   Multiple Vitamins-Minerals (CENTRUM SILVER PO) Take 1 tablet by mouth daily.   benzonatate (TESSALON) 100 MG capsule Take 1 capsule (100 mg total) by mouth 3 (three) times daily as  needed for cough. (Patient not taking: Reported on 04/14/2022)   cephALEXin (KEFLEX) 500 MG capsule Take 1 capsule (500 mg total) by mouth 2 (two) times daily. (Patient not taking: Reported on 04/14/2022)   No current facility-administered medications on file prior to visit.     Allergies:   Codeine   Social History   Tobacco Use   Smoking status: Never   Smokeless tobacco: Never  Substance Use Topics   Alcohol use: Yes    Alcohol/week: 7.0 standard drinks of alcohol    Types: 7 Glasses of wine per week    Comment: Wine at dinner   Drug use: No    Family History: family history includes Breast cancer in her mother; Cancer in  her sister; Diabetes in her maternal aunt, maternal grandmother, mother, and sister; Heart attack in her mother; Heart attack (age of onset: 44) in her father; Heart disease in her sister; Osteoporosis in her mother. There is no history of Stroke.  ROS:   Please see the history of present illness.   Additional pertinent ROS otherwise unremarkable.    EKGs/Labs/Other Studies Reviewed:    The following studies were reviewed today:  Echo 01/19/21 1. Left ventricular ejection fraction, by estimation, is 50 to 55%. The  left ventricle has normal function. The left ventricle has no regional  wall motion abnormalities. Diastolic function is indeterminant due to  atrial fibrillation.   2. Right ventricular systolic function is normal. The right ventricular  size is normal. There is normal pulmonary artery systolic pressure. The  estimated right ventricular systolic pressure is 90.3 mmHg.   3. Left atrial size was moderately dilated.   4. Right atrial size was severely dilated.   5. The mitral valve is degenerative. Trivial mitral valve regurgitation.   6. There is moderate, functional tricuspid regurgitation.   7. The aortic valve is tricuspid. Aortic valve regurgitation is mild.   8. The inferior vena cava is normal in size with greater than 50%  respiratory variability, suggesting right atrial pressure of 3 mmHg.   EKG:  EKG is personally reviewed.   04/14/22: afib at 73 bpm, RBBB 04/07/21: atrial fibrillation at 86 bpm, RBBB  Recent Labs: No results found for requested labs within last 365 days.  Recent Lipid Panel    Component Value Date/Time   CHOL 200 01/12/2021 1437   TRIG 61.0 01/12/2021 1437   HDL 84.10 01/12/2021 1437   CHOLHDL 2 01/12/2021 1437   VLDL 12.2 01/12/2021 1437   LDLCALC 104 (H) 01/12/2021 1437   LDLDIRECT 113.4 06/28/2011 1401    Physical Exam:    VS:  BP 116/74   Pulse 73   Ht 5' (1.524 m)   Wt 105 lb (47.6 kg)   SpO2 99%   BMI 20.51 kg/m     Wt  Readings from Last 3 Encounters:  04/14/22 105 lb (47.6 kg)  03/10/22 105 lb 6.4 oz (47.8 kg)  02/08/22 102 lb (46.3 kg)    GEN: Well nourished, well developed in no acute distress HEENT: Normal, moist mucous membranes NECK: No JVD CARDIAC: irregularly irregular rhythm, normal S1 and S2, no rubs or gallops. No murmur. VASCULAR: Radial and DP pulses 2+ bilaterally. No carotid bruits RESPIRATORY:  Clear to auscultation without rales, wheezing or rhonchi  ABDOMEN: Soft, non-tender, non-distended MUSCULOSKELETAL:  Ambulates independently SKIN: Warm and dry, no edema NEUROLOGIC:  Alert and oriented x 3. No focal neuro deficits noted. PSYCHIATRIC:  Normal affect   ASSESSMENT:  1. Permanent atrial fibrillation ([place])   2. Medication management   3. Secondary hypercoagulable state ([place])   4. Long term current use of anticoagulant   5. Cardiac risk counseling     PLAN:    Atrial fibrillation, permanent -echo as above. Low normal EF, biatrial enlargement. -did not convert with cardioversion. She does not wish to attempt rhythm control, either with change in medications, reattempted cardioversion, or ablation. Thus, she is permanent atrial fibrillation -tolerating low dose long acting dilitazem.  -CHA2DS2/VAS Stroke Risk Points= 3 -she agrees with continuation of apixaban, despite cost. We did discuss alternatives (coumadin) vs DOACs, and she and I both prefer DOAC -she will contact me if she develops symptoms/limitations. -due for anticoagulation monitoring labs, ordered today  Cardiac risk counseling and prevention recommendations: -recommend heart healthy/Mediterranean diet, with whole grains, fruits, vegetable, fish, lean meats, nuts, and olive oil. Limit salt. -recommend moderate walking, 3-5 times/week for 30-50 minutes each session. Aim for at least 150 minutes.week. Goal should be pace of 3 miles/hours, or walking 1.5 miles in 30 minutes -recommend avoidance of tobacco products.  Avoid excess alcohol.  Plan for follow up: 1 year or sooner if needed  Buford Dresser, MD, PhD, [facility]    Medication Adjustments/Labs and Tests Ordered: Current medicines are reviewed at length with the patient today.  Concerns regarding medicines are outlined above.  Orders Placed This Encounter  Procedures   Basic Metabolic Panel (BMET)   CBC   EKG 12-Lead   No orders of the defined types were placed in this encounter.   Patient Instructions  Medication Instructions:  Continue current medications  *If you need a refill on your cardiac medications before your next appointment, please call your pharmacy*   Lab Work: CBC and BMP  If you have labs (blood work) drawn today and your tests are completely normal, you will receive your results only by: [place] (if you have MyChart) OR A paper copy in the mail If you have any lab test that is abnormal or we need to change your treatment, we will call you to review the results.   Testing/Procedures: None Ordered   Follow-Up: At [facility], you and your health needs are our priority.  As part of our continuing mission to provide you with exceptional heart care, we have created designated Provider Care Teams.  These Care Teams include your primary Cardiologist (physician) and Advanced Practice Providers (APPs -  Physician Assistants and Nurse Practitioners) who all work together to provide you with the care you need, when you need it.  We recommend signing up for the patient portal called "MyChart".  Sign up information is provided on this After Visit Summary.  MyChart is used to connect with patients for Virtual Visits (Telemedicine).  Patients are able to view lab/test results, encounter notes, upcoming appointments, etc.  Non-urgent messages can be sent to your provider as well.   To learn more about what you can do with MyChart, go to [email or web address].    Your next appointment:    1 year(s)  The format for your next appointment:   In Person  Provider:   Buford Dresser, MD    Other Instructions   Important Information About Sugar           I,Mathew Stumpf,acting as a scribe for Buford Dresser, MD.,have documented all relevant documentation on the behalf of Buford Dresser, MD,as directed by  Buford Dresser, MD while in the presence of [place],  MD.   Deetta Perla, MD, have reviewed all documentation for this visit. The documentation on 04/14/22 for the exam, diagnosis, procedures, and orders are all accurate and complete.   Signed, Buford Dresser, MD PhD 04/14/2022  [place]

## 2022-04-14 NOTE — Patient Instructions (Signed)
Medication Instructions:  Continue current medications  *If you need a refill on your cardiac medications before your next appointment, please call your pharmacy*   Lab Work: CBC and BMP  If you have labs (blood work) drawn today and your tests are completely normal, you will receive your results only by: [place] (if you have MyChart) OR A paper copy in the mail If you have any lab test that is abnormal or we need to change your treatment, we will call you to review the results.   Testing/Procedures: None Ordered   Follow-Up: At [facility], you and your health needs are our priority.  As part of our continuing mission to provide you with exceptional heart care, we have created designated Provider Care Teams.  These Care Teams include your primary Cardiologist (physician) and Advanced Practice Providers (APPs -  Physician Assistants and Nurse Practitioners) who all work together to provide you with the care you need, when you need it.  We recommend signing up for the patient portal called "MyChart".  Sign up information is provided on this After Visit Summary.  MyChart is used to connect with patients for Virtual Visits (Telemedicine).  Patients are able to view lab/test results, encounter notes, upcoming appointments, etc.  Non-urgent messages can be sent to your provider as well.   To learn more about what you can do with MyChart, go to [email or web address].    Your next appointment:   1 year(s)  The format for your next appointment:   In Person  Provider:   Buford Dresser, MD    Other Instructions   Important Information About Sugar

## 2022-04-15 LAB — BASIC METABOLIC PANEL
BUN/Creatinine Ratio: 32 — ABNORMAL HIGH (ref 12–28)
BUN: 24 mg/dL (ref 8–27)
CO2: 23 mmol/L (ref 20–29)
Calcium: 9.4 mg/dL (ref 8.7–10.3)
Chloride: 106 mmol/L (ref 96–106)
Creatinine, Ser: 0.76 mg/dL (ref 0.57–1.00)
Glucose: 129 mg/dL — ABNORMAL HIGH (ref 70–99)
Potassium: 4.6 mmol/L (ref 3.5–5.2)
Sodium: 144 mmol/L (ref 134–144)
eGFR: 75 mL/min/{1.73_m2} (ref >59)

## 2022-04-15 LAB — CBC
Hematocrit: 41.5 % (ref 34.0–46.6)
Hemoglobin: 14.5 g/dL (ref 11.1–15.9)
MCH: 31.5 pg (ref 26.6–33.0)
MCHC: 34.9 g/dL (ref 31.5–35.7)
MCV: 90 fL (ref 79–97)
Platelets: 202 10*3/uL (ref 150–450)
RBC: 4.6 x10E6/uL (ref 3.77–5.28)
RDW: 11.8 % (ref 11.7–15.4)
WBC: 5.2 10*3/uL (ref 3.4–10.8)

## 2022-05-17 ENCOUNTER — Encounter: Payer: Self-pay | Admitting: Family Medicine

## 2022-05-17 ENCOUNTER — Ambulatory Visit (INDEPENDENT_AMBULATORY_CARE_PROVIDER_SITE_OTHER): Payer: Medicare Other | Admitting: Family Medicine

## 2022-05-17 VITALS — BP 128/72 | HR 70 | Temp 97.7°F | Ht 60.0 in | Wt 104.0 lb

## 2022-05-17 DIAGNOSIS — L989 Disorder of the skin and subcutaneous tissue, unspecified: Secondary | ICD-10-CM | POA: Diagnosis not present

## 2022-05-17 NOTE — Assessment & Plan Note (Signed)
I recommend biopsy. She will call and schedule with her dermatologist.

## 2022-05-17 NOTE — Patient Instructions (Signed)
Please call and schedule with your dermatologist.

## 2022-05-17 NOTE — Progress Notes (Signed)
Subjective:     Patient ID: Danielle Mcgee, female    DOB: [birth year], 88 y.o.   MRN: [id number]  Chief Complaint  Patient presents with   Cyst    Swollen knot on left leg, tender to the touch. Hit her leg early may on box of flowers and knot is still present.     HPI Patient is in today for a swollen and tender area on her left lateral lower leg X 2-3 months. States she recalls hitting her leg on something and then the current knot formed.  States area has grown into a raised lesion and is getting larger.   No fever, chills, fatigue, N/V/D.     Health Maintenance Due  Topic Date Due   Zoster Vaccines- Shingrix (2 of 2) 08/11/2019   DEXA SCAN  07/20/2021    Past Medical History:  Diagnosis Date   Benign breast disease 2000   DJD (degenerative joint disease)    Generalized headaches    Heart murmur    Hyperlipidemia    Mitral click-murmur syndrome    nomitral  reguritation   Osteopenia    Temporal arteritis ([place])    2013-14    Past Surgical History:  Procedure Laterality Date   APPENDECTOMY  1962   ARTERY BIOPSY  09/20/2011   Procedure: BIOPSY TEMPORAL ARTERY;  Surgeon: Adin Hector, MD;  Location: [place];  Service: General;  Laterality: Left;   BREAST BIOPSY  2000   Atypical Lobular Hyperplasia   CARDIOVERSION N/A 02/11/2021   Procedure: CARDIOVERSION;  Surgeon: Buford Dresser, MD;  Location: [place];  Service: Cardiovascular;  Laterality: N/A;   CATARACT EXTRACTION     bilaterally   COLONOSCOPY  2002 & 2013   Dr Olevia Perches   ELBOW SURGERY  2009   lt fx   ENDOSCOPIC TURBINATE REDUCTION Bilateral 10/06/2015   Procedure: ENDOSCOPIC TURBINATE REDUCTION;  Surgeon: Jerrell Belfast, MD;  Location: [place];  Service: ENT;  Laterality: Bilateral;   EYE SURGERY     FRACTURE SURGERY Bilateral    wrist   JOINT REPLACEMENT  2001   both-knees   SINUS ENDO W/FUSION Left 10/06/2015   Procedure: LEFT ENDOSCOPIC SINUS SURGERY WITH MAXILLARY ANTROSTOMY AND  REMOVAL OF DISEASED TISSUE ;  Surgeon: Jerrell Belfast, MD;  [facility];  Service: ENT;  Laterality: Left;   TOTAL KNEE ARTHROPLASTY     bilaterally    Family History  Problem Relation Age of Onset   Breast cancer Mother    Heart attack Mother        early 57s   Osteoporosis Mother    Diabetes Mother    Heart attack Father 38   Heart disease Sister        CABG in late 7s   Cancer Sister        CERVICAL   Diabetes Sister    Diabetes Maternal Aunt        X2   Diabetes Maternal Grandmother    Stroke Neg Hx     Social History   Socioeconomic History   Marital status: Widowed    Spouse name: Not on file   Number of children: 4   Years of education: College   Highest education level: Not on file  Occupational History   Occupation: Retired  Tobacco Use   Smoking status: Never   Smokeless tobacco: Never  Substance and Sexual Activity   Alcohol use: Yes    Alcohol/week: 7.0 standard drinks of alcohol  Types: 7 Glasses of wine per week    Comment: Wine at dinner   Drug use: No   Sexual activity: Not on file  Other Topics Concern   Not on file  Social History Narrative   Lives at home alone   Right-handed   Caffeine: 1-3 Coke/tea   Exercise: walking regularly   Social Determinants of Health   Financial Resource Strain: Not on file  Food Insecurity: Not on file  Transportation Needs: Not on file  Physical Activity: Not on file  Stress: Not on file  Social Connections: Not on file  Intimate Partner Violence: Not on file    Outpatient Medications Prior to Visit  Medication Sig Dispense Refill   Calcium Carbonate-Vitamin D (CALCIUM + D PO) Take 1,200 mg by mouth daily.     diltiazem (CARDIZEM CD) 120 MG 24 hr capsule TAKE 1 CAPSULE BY MOUTH EVERY DAY 90 capsule 1   ELIQUIS 2.5 MG TABS tablet TAKE 1 TABLET BY MOUTH TWICE A DAY 180 tablet 3   ipratropium (ATROVENT) 0.06 % nasal spray Place 2 sprays into both nostrils daily. 15 mL 12   Multiple  Vitamins-Minerals (CENTRUM SILVER PO) Take 1 tablet by mouth daily.     benzonatate (TESSALON) 100 MG capsule Take 1 capsule (100 mg total) by mouth 3 (three) times daily as needed for cough. (Patient not taking: Reported on 04/14/2022) 30 capsule 0   cephALEXin (KEFLEX) 500 MG capsule Take 1 capsule (500 mg total) by mouth 2 (two) times daily. (Patient not taking: Reported on 04/14/2022) 14 capsule 0   No facility-administered medications prior to visit.    Allergies  Allergen Reactions   Codeine     REACTION: GI PROBLEMS    ROS     Objective:    Physical Exam  BP 128/72 (BP Location: Left Arm, Patient Position: Sitting, Cuff Size: Large)   Pulse 70   Temp 97.7 F (36.5 C) (Temporal)   Ht 5' (1.524 m)   Wt 104 lb (47.2 kg)   SpO2 98%   BMI 20.31 kg/m  Wt Readings from Last 3 Encounters:  05/17/22 104 lb (47.2 kg)  04/14/22 105 lb (47.6 kg)  03/10/22 105 lb 6.4 oz (47.8 kg)     Alert and oriented and in no acute distress. Respirations unlabored. Left lower leg with a raised firm lesion, pitted and scabbed center, tender to palpation and significant scaling present. No surrounding erythema, induration, fluctuant or drainage. LLE is neurovascularly intact.     Assessment & Plan:   Problem List Items Addressed This Visit       Musculoskeletal and Integument   Skin lesion of left lower extremity - Primary    I recommend biopsy. She will call and schedule with her dermatologist.        I have discontinued Kyri Shader Schalk's cephALEXin and benzonatate. I am also having her maintain her Multiple Vitamins-Minerals (CENTRUM SILVER PO), Calcium Carbonate-Vitamin D (CALCIUM + D PO), ipratropium, diltiazem, and Eliquis.  No orders of the defined types were placed in this encounter.

## 2022-05-19 ENCOUNTER — Encounter: Payer: Self-pay | Admitting: Family Medicine

## 2022-05-19 ENCOUNTER — Telehealth (INDEPENDENT_AMBULATORY_CARE_PROVIDER_SITE_OTHER): Payer: Medicare Other | Admitting: Family Medicine

## 2022-05-19 DIAGNOSIS — R509 Fever, unspecified: Secondary | ICD-10-CM

## 2022-05-19 DIAGNOSIS — U071 COVID-19: Secondary | ICD-10-CM | POA: Diagnosis not present

## 2022-05-19 DIAGNOSIS — R051 Acute cough: Secondary | ICD-10-CM

## 2022-05-19 MED ORDER — MOLNUPIRAVIR 200 MG PO CAPS: 4.0000 | ORAL_CAPSULE | Freq: Two times a day (BID) | ORAL | 0 refills | Status: AC

## 2022-05-19 NOTE — Progress Notes (Signed)
Virtual telephone visit    Virtual Visit via Telephone Note   This visit type was conducted due to national recommendations for restrictions regarding the COVID-19 Pandemic (e.g. social distancing) in an effort to limit this patient's exposure and mitigate transmission in our community. Due to her co-morbid illnesses, this patient is at least at moderate risk for complications without adequate follow up. This format is felt to be most appropriate for this patient at this time. The patient did not have access to video technology or had technical difficulties with video requiring transitioning to audio format only (telephone). Physical exam was limited to content and character of the telephone converstion. CMA was able to get the patient set up on a telephone visit.   Patient location: Home. Patient and provider in visit Provider location: Office  I discussed the limitations of evaluation and management by telemedicine and the availability of in person appointments. The patient expressed understanding and agreed to proceed.   Visit Date: 05/19/2022  Today's healthcare provider: Harland Dingwall, NP-C     Subjective:    Patient ID: Danielle Mcgee, female    DOB: 1930-06-23, 88 y.o.   MRN: [id number]  No chief complaint on file.   HPI  States she woke up with fever, chills, body aches 2 nights ago.  Temperature of 101.   Coughing and sore throat.   Tested positive for Covid yesterday.   Is taking Tylenol.   She has had all the Covid vaccines.  Has not had COVID until now.  Denies dizziness, chest pain, palpitations, shortness of breath, abdominal pain, nausea, vomiting or diarrhea.    Past Medical History:  Diagnosis Date   Benign breast disease 2000   DJD (degenerative joint disease)    Generalized headaches    Heart murmur    Hyperlipidemia    Mitral click-murmur syndrome    nomitral  reguritation   Osteopenia    Temporal arteritis ([place])    2013-14    Past  Surgical History:  Procedure Laterality Date   APPENDECTOMY  1962   ARTERY BIOPSY  09/20/2011   Procedure: BIOPSY TEMPORAL ARTERY;  Surgeon: Adin Hector, MD;  Location: [facility];  Service: General;  Laterality: Left;   BREAST BIOPSY  2000   Atypical Lobular Hyperplasia   CARDIOVERSION N/A 02/11/2021   Procedure: CARDIOVERSION;  Surgeon: Buford Dresser, MD;  Location: [facility];  Service: Cardiovascular;  Laterality: N/A;   CATARACT EXTRACTION     bilaterally   COLONOSCOPY  2002 & 2013   Dr Olevia Perches   ELBOW SURGERY  2009   lt fx   ENDOSCOPIC TURBINATE REDUCTION Bilateral 10/06/2015   Procedure: ENDOSCOPIC TURBINATE REDUCTION;  Surgeon: Jerrell Belfast, MD;  Location: [facility];  Service: ENT;  Laterality: Bilateral;   EYE SURGERY     FRACTURE SURGERY Bilateral    wrist   JOINT REPLACEMENT  2001   both-knees   SINUS ENDO W/FUSION Left 10/06/2015   Procedure: LEFT ENDOSCOPIC SINUS SURGERY WITH MAXILLARY ANTROSTOMY AND REMOVAL OF DISEASED TISSUE ;  Surgeon: Jerrell Belfast, MD;  [facility];  Service: ENT;  Laterality: Left;   TOTAL KNEE ARTHROPLASTY     bilaterally    Family History  Problem Relation Age of Onset   Breast cancer Mother    Heart attack Mother        early 8s   Osteoporosis Mother    Diabetes Mother    Heart attack Father 65   Heart disease Sister  CABG in late 61s   Cancer Sister        CERVICAL   Diabetes Sister    Diabetes Maternal Aunt        X2   Diabetes Maternal Grandmother    Stroke Neg Hx     Social History   Socioeconomic History   Marital status: Widowed    Spouse name: Not on file   Number of children: 4   Years of education: College   Highest education level: Not on file  Occupational History   Occupation: Retired  Tobacco Use   Smoking status: Never   Smokeless tobacco: Never  Substance and Sexual Activity   Alcohol use: Yes    Alcohol/week: 7.0 standard drinks of alcohol    Types: 7 Glasses of wine per week     Comment: Wine at dinner   Drug use: No   Sexual activity: Not on file  Other Topics Concern   Not on file  Social History Narrative   Lives at home alone   Right-handed   Caffeine: 1-3 Coke/tea   Exercise: walking regularly   Social Determinants of Health   Financial Resource Strain: Not on file  Food Insecurity: Not on file  Transportation Needs: Not on file  Physical Activity: Not on file  Stress: Not on file  Social Connections: Not on file  Intimate Partner Violence: Not on file    Outpatient Medications Prior to Visit  Medication Sig Dispense Refill   Calcium Carbonate-Vitamin D (CALCIUM + D PO) Take 1,200 mg by mouth daily.     diltiazem (CARDIZEM CD) 120 MG 24 hr capsule TAKE 1 CAPSULE BY MOUTH EVERY DAY 90 capsule 1   ELIQUIS 2.5 MG TABS tablet TAKE 1 TABLET BY MOUTH TWICE A DAY 180 tablet 3   ipratropium (ATROVENT) 0.06 % nasal spray Place 2 sprays into both nostrils daily. 15 mL 12   Multiple Vitamins-Minerals (CENTRUM SILVER PO) Take 1 tablet by mouth daily.     No facility-administered medications prior to visit.    Allergies  Allergen Reactions   Codeine     REACTION: GI PROBLEMS    ROS     Objective:    Physical Exam  There were no vitals taken for this visit. Wt Readings from Last 3 Encounters:  05/17/22 104 lb (47.2 kg)  04/14/22 105 lb (47.6 kg)  03/10/22 105 lb 6.4 oz (47.8 kg)   Alert and oriented.  No acute distress.  Speaking in complete sentences without difficulty.     Assessment & Plan:   Problem List Items Addressed This Visit       Other   Acute cough   COVID-19 virus infection - Primary    No acute distress.  Molnupiravir prescribed.  Discussed how to take the medication and potential side effects.  Discussed over-the-counter symptomatic management.  Counseling on CDC guidelines for quarantine.  She will follow-up if worsening or if she has any other concerns in the next few days.  Advised that if she is getting significantly  worse over the weekend that she would need to go to an urgent care.      Relevant Medications   molnupiravir EUA (LAGEVRIO) 200 MG CAPS capsule   Other Visit Diagnoses     Fever and chills           I am having Danielle Mcgee start on molnupiravir EUA. I am also having her maintain her Multiple Vitamins-Minerals (CENTRUM SILVER PO), Calcium Carbonate-Vitamin D (CALCIUM +  D PO), ipratropium, diltiazem, and Eliquis.  Meds ordered this encounter  Medications   molnupiravir EUA (LAGEVRIO) 200 MG CAPS capsule    Sig: Take 4 capsules (800 mg total) by mouth 2 (two) times daily for 5 days.    Dispense:  40 capsule    Refill:  0    Order Specific Question:   Supervising Provider    Answer:   Pricilla Holm A [[id number]]     I discussed the assessment and treatment plan with the patient. The patient was provided an opportunity to ask questions and all were answered. The patient agreed with the plan and demonstrated an understanding of the instructions.   The patient was advised to call back or seek an in-person evaluation if the symptoms worsen or if the condition fails to improve as anticipated.  I provided 15 minutes of non-face-to-face time during this encounter.   Harland Dingwall, NP-C [facility] [phone] (phone) [phone] (fax)  [place]

## 2022-05-19 NOTE — Patient Instructions (Signed)
COVID-19: Quarantine and Isolation °Quarantine °If you were exposed °Quarantine and stay away from others when you have been in close contact with someone who has COVID-19. °Isolate °If you are sick or test positive °Isolate when you are sick or when you have COVID-19, even if you don't have symptoms. °When to stay home °Calculating quarantine °The date of your exposure is considered day 0. Day 1 is the first full day after your last contact with a person who has had COVID-19. Stay home and away from other people for at least 5 days. Learn why CDC updated guidance for the general public. °IF YOU were exposed to COVID-19 and are NOT  °up to dateIF YOU were exposed to COVID-19 and are NOT on COVID-19 vaccinations °Quarantine for at least 5 days °Stay home °Stay home and quarantine for at least 5 full days. °Wear a well-fitting mask if you must be around others in your home. °Do not travel. °Get tested °Even if you don't develop symptoms, get tested at least 5 days after you last had close contact with someone with COVID-19. °After quarantine °Watch for symptoms °Watch for symptoms until 10 days after you last had close contact with someone with COVID-19. °Avoid travel °It is best to avoid travel until a full 10 days after you last had close contact with someone with COVID-19. °If you develop symptoms °Isolate immediately and get tested. Continue to stay home until you know the results. Wear a well-fitting mask around others. °Take precautions until day 10 °Wear a well-fitting mask °Wear a well-fitting mask for 10 full days any time you are around others inside your home or in public. Do not go to places where you are unable to wear a well-fitting mask. °If you must travel during days 6-10, take precautions. °Avoid being around people who are more likely to get very sick from COVID-19. °IF YOU were exposed to COVID-19 and are  °up to dateIF YOU were exposed to COVID-19 and are on COVID-19 vaccinations °No  quarantine °You do not need to stay home unless you develop symptoms. °Get tested °Even if you don't develop symptoms, get tested at least 5 days after you last had close contact with someone with COVID-19. °Watch for symptoms °Watch for symptoms until 10 days after you last had close contact with someone with COVID-19. °If you develop symptoms °Isolate immediately and get tested. Continue to stay home until you know the results. Wear a well-fitting mask around others. °Take precautions until day 10 °Wear a well-fitting mask °Wear a well-fitting mask for 10 full days any time you are around others inside your home or in public. Do not go to places where you are unable to wear a well-fitting mask. °Take precautions if traveling °Avoid being around people who are more likely to get very sick from COVID-19. °IF YOU were exposed to COVID-19 and had confirmed COVID-19 within the past 90 days (you tested positive using a viral test) °No quarantine °You do not need to stay home unless you develop symptoms. °Watch for symptoms °Watch for symptoms until 10 days after you last had close contact with someone with COVID-19. °If you develop symptoms °Isolate immediately and get tested. Continue to stay home until you know the results. Wear a well-fitting mask around others. °Take precautions until day 10 °Wear a well-fitting mask °Wear a well-fitting mask for 10 full days any time you are around others inside your home or in public. Do not go to places where you are   unable to wear a well-fitting mask. °Take precautions if traveling °Avoid being around people who are more likely to get very sick from COVID-19. °Calculating isolation °Day 0 is your first day of symptoms or a positive viral test. Day 1 is the first full day after your symptoms developed or your test specimen was collected. If you have COVID-19 or have symptoms, isolate for at least 5 days. °IF YOU tested positive for COVID-19 or have symptoms, regardless of  vaccination status °Stay home for at least 5 days °Stay home for 5 days and isolate from others in your home. °Wear a well-fitting mask if you must be around others in your home. °Do not travel. °Ending isolation if you had symptoms °End isolation after 5 full days if you are fever-free for 24 hours (without the use of fever-reducing medication) and your symptoms are improving. °Ending isolation if you did NOT have symptoms °End isolation after at least 5 full days after your positive test. °If you got very sick from COVID-19 or have a weakened immune system °You should isolate for at least 10 days. Consult your doctor before ending isolation. °Take precautions until day 10 °Wear a well-fitting mask °Wear a well-fitting mask for 10 full days any time you are around others inside your home or in public. Do not go to places where you are unable to wear a well-fitting mask. °Do not travel °Do not travel until a full 10 days after your symptoms started or the date your positive test was taken if you had no symptoms. °Avoid being around people who are more likely to get very sick from COVID-19. °Definitions °Exposure °Contact with someone infected with SARS-CoV-2, the virus that causes COVID-19, in a way that increases the likelihood of getting infected with the virus. °Close contact °A close contact is someone who was less than 6 feet away from an infected person (laboratory-confirmed or a clinical diagnosis) for a cumulative total of 15 minutes or more over a 24-hour period. For example, three individual 5-minute exposures for a total of 15 minutes. People who are exposed to someone with COVID-19 after they completed at least 5 days of isolation are not considered close contacts. °Quarantine °Quarantine is a strategy used to prevent transmission of COVID-19 by keeping people who have been in close contact with someone with COVID-19 apart from others. °Who does not need to quarantine? °If you had close contact with  someone with COVID-19 and you are in one of the following groups, you do not need to quarantine. °You are up to date with your COVID-19 vaccines. °You had confirmed COVID-19 within the last 90 days (meaning you tested positive using a viral test). °If you are up to date with COVID-19 vaccines, you should wear a well-fitting mask around others for 10 days from the date of your last close contact with someone with COVID-19 (the date of last close contact is considered day 0). Get tested at least 5 days after you last had close contact with someone with COVID-19. If you test positive or develop COVID-19 symptoms, isolate from other people and follow recommendations in the Isolation section below. If you tested positive for COVID-19 with a viral test within the previous 90 days and subsequently recovered and remain without COVID-19 symptoms, you do not need to quarantine or get tested after close contact. You should wear a well-fitting mask around others for 10 days from the date of your last close contact with someone with COVID-19 (the date of last   close contact is considered day 0). If you have COVID-19 symptoms, get tested and isolate from other people and follow recommendations in the Isolation section below. °Who should quarantine? °If you come into close contact with someone with COVID-19, you should quarantine if you are not up to date on COVID-19 vaccines. This includes people who are not vaccinated. °What to do for quarantine °Stay home and away from other people for at least 5 days (day 0 through day 5) after your last contact with a person who has COVID-19. The date of your exposure is considered day 0. Wear a well-fitting mask when around others at home, if possible. °For 10 days after your last close contact with someone with COVID-19, watch for fever (100.4°F or greater), cough, shortness of breath, or other COVID-19 symptoms. °If you develop symptoms, get tested immediately and isolate until you receive  your test results. If you test positive, follow isolation recommendations. °If you do not develop symptoms, get tested at least 5 days after you last had close contact with someone with COVID-19. °If you test negative, you can leave your home, but continue to wear a well-fitting mask when around others at home and in public until 10 days after your last close contact with someone with COVID-19. °If you test positive, you should isolate for at least 5 days from the date of your positive test (if you do not have symptoms). If you do develop COVID-19 symptoms, isolate for at least 5 days from the date your symptoms began (the date the symptoms started is day 0). Follow recommendations in the isolation section below. °If you are unable to get a test 5 days after last close contact with someone with COVID-19, you can leave your home after day 5 if you have been without COVID-19 symptoms throughout the 5-day period. Wear a well-fitting mask for 10 days after your date of last close contact when around others at home and in public. °Avoid people who are have weakened immune systems or are more likely to get very sick from COVID-19, and nursing homes and other high-risk settings, until after at least 10 days. °If possible, stay away from people you live with, especially people who are at higher risk for getting very sick from COVID-19, as well as others outside your home throughout the full 10 days after your last close contact with someone with COVID-19. °If you are unable to quarantine, you should wear a well-fitting mask for 10 days when around others at home and in public. °If you are unable to wear a mask when around others, you should continue to quarantine for 10 days. Avoid people who have weakened immune systems or are more likely to get very sick from COVID-19, and nursing homes and other high-risk settings, until after at least 10 days. °See additional information about travel. °Do not go to places where you are  unable to wear a mask, such as restaurants and some gyms, and avoid eating around others at home and at work until after 10 days after your last close contact with someone with COVID-19. °After quarantine °Watch for symptoms until 10 days after your last close contact with someone with COVID-19. °If you have symptoms, isolate immediately and get tested. °Quarantine in high-risk congregate settings °In certain congregate settings that have high risk of secondary transmission (such as correctional and detention facilities, homeless shelters, or cruise ships), CDC recommends a 10-day quarantine for residents, regardless of vaccination and booster status. During periods of critical staffing   shortages, facilities may consider shortening the quarantine period for staff to ensure continuity of operations. Decisions to shorten quarantine in these settings should be made in consultation with state, local, tribal, or territorial health departments and should take into consideration the context and characteristics of the facility. CDC's setting-specific guidance provides additional recommendations for these settings. °Isolation °Isolation is used to separate people with confirmed or suspected COVID-19 from those without COVID-19. People who are in isolation should stay home until it's safe for them to be around others. At home, anyone sick or infected should separate from others, or wear a well-fitting mask when they need to be around others. People in isolation should stay in a specific "sick room" or area and use a separate bathroom if available. Everyone who has presumed or confirmed COVID-19 should stay home and isolate from other people for at least 5 full days (day 0 is the first day of symptoms or the date of the day of the positive viral test for asymptomatic persons). They should wear a mask when around others at home and in public for an additional 5 days. People who are confirmed to have COVID-19 or are showing  symptoms of COVID-19 need to isolate regardless of their vaccination status. This includes: °People who have a positive viral test for COVID-19, regardless of whether or not they have symptoms. °People with symptoms of COVID-19, including people who are awaiting test results or have not been tested. People with symptoms should isolate even if they do not know if they have been in close contact with someone with COVID-19. °What to do for isolation °Monitor your symptoms. If you have an emergency warning sign (including trouble breathing), seek emergency medical care immediately. °Stay in a separate room from other household members, if possible. °Use a separate bathroom, if possible. °Take steps to improve ventilation at home, if possible. °Avoid contact with other members of the household and pets. °Don't share personal household items, like cups, towels, and utensils. °Wear a well-fitting mask when you need to be around other people. °Learn more about what to do if you are sick and how to notify your contacts. °Ending isolation for people who had COVID-19 and had symptoms °If you had COVID-19 and had symptoms, isolate for at least 5 days. To calculate your 5-day isolation period, day 0 is your first day of symptoms. Day 1 is the first full day after your symptoms developed. You can leave isolation after 5 full days. °You can end isolation after 5 full days if you are fever-free for 24 hours without the use of fever-reducing medication and your other symptoms have improved (Loss of taste and smell may persist for weeks or months after recovery and need not delay the end of isolation). °You should continue to wear a well-fitting mask around others at home and in public for 5 additional days (day 6 through day 10) after the end of your 5-day isolation period. If you are unable to wear a mask when around others, you should continue to isolate for a full 10 days. Avoid people who have weakened immune systems or are more  likely to get very sick from COVID-19, and nursing homes and other high-risk settings, until after at least 10 days. °If you continue to have fever or your other symptoms have not improved after 5 days of isolation, you should wait to end your isolation until you are fever-free for 24 hours without the use of fever-reducing medication and your other symptoms have improved.   Continue to wear a well-fitting mask through day 10. Contact your healthcare provider if you have questions. °See additional information about travel. °Do not go to places where you are unable to wear a mask, such as restaurants and some gyms, and avoid eating around others at home and at work until a full 10 days after your first day of symptoms. °If an individual has access to a test and wants to test, the best approach is to use an antigen test1 towards the end of the 5-day isolation period. Collect the test sample only if you are fever-free for 24 hours without the use of fever-reducing medication and your other symptoms have improved (loss of taste and smell may persist for weeks or months after recovery and need not delay the end of isolation). If your test result is positive, you should continue to isolate until day 10. If your test result is negative, you can end isolation, but continue to wear a well-fitting mask around others at home and in public until day 10. Follow additional recommendations for masking and avoiding travel as described above. °1As noted in the labeling for authorized over-the counter antigen tests: Negative results should be treated as presumptive. Negative results do not rule out SARS-CoV-2 infection and should not be used as the sole basis for treatment or patient management decisions, including infection control decisions. To improve results, antigen tests should be used twice over a three-day period with at least 24 hours and no more than 48 hours between tests. °Note that these recommendations on ending isolation  do not apply to people who are moderately ill or very sick from COVID-19 or have weakened immune systems. See section below for recommendations for when to end isolation for these groups. °Ending isolation for people who tested positive for COVID-19 but had no symptoms °If you test positive for COVID-19 and never develop symptoms, isolate for at least 5 days. Day 0 is the day of your positive viral test (based on the date you were tested) and day 1 is the first full day after the specimen was collected for your positive test. You can leave isolation after 5 full days. °If you continue to have no symptoms, you can end isolation after at least 5 days. °You should continue to wear a well-fitting mask around others at home and in public until day 10 (day 6 through day 10). If you are unable to wear a mask when around others, you should continue to isolate for 10 days. Avoid people who have weakened immune systems or are more likely to get very sick from COVID-19, and nursing homes and other high-risk settings, until after at least 10 days. °If you develop symptoms after testing positive, your 5-day isolation period should start over. Day 0 is your first day of symptoms. Follow the recommendations above for ending isolation for people who had COVID-19 and had symptoms. °See additional information about travel. °Do not go to places where you are unable to wear a mask, such as restaurants and some gyms, and avoid eating around others at home and at work until 10 days after the day of your positive test. °If an individual has access to a test and wants to test, the best approach is to use an antigen test1 towards the end of the 5-day isolation period. If your test result is positive, you should continue to isolate until day 10. If your test result is positive, you can also choose to test daily and if your test result   is negative, you can end isolation, but continue to wear a well-fitting mask around others at home and in  public until day 10. Follow additional recommendations for masking and avoiding travel as described above. °1As noted in the labeling for authorized over-the counter antigen tests: Negative results should be treated as presumptive. Negative results do not rule out SARS-CoV-2 infection and should not be used as the sole basis for treatment or patient management decisions, including infection control decisions. To improve results, antigen tests should be used twice over a three-day period with at least 24 hours and no more than 48 hours between tests. °Ending isolation for people who were moderately or very sick from COVID-19 or have a weakened immune system °People who are moderately ill from COVID-19 (experiencing symptoms that affect the lungs like shortness of breath or difficulty breathing) should isolate for 10 days and follow all other isolation precautions. To calculate your 10-day isolation period, day 0 is your first day of symptoms. Day 1 is the first full day after your symptoms developed. If you are unsure if your symptoms are moderate, talk to a healthcare provider for further guidance. °People who are very sick from COVID-19 (this means people who were hospitalized or required intensive care or ventilation support) and people who have weakened immune systems might need to isolate at home longer. They may also require testing with a viral test to determine when they can be around others. CDC recommends an isolation period of at least 10 and up to 20 days for people who were very sick from COVID-19 and for people with weakened immune systems. Consult with your healthcare provider about when you can resume being around other people. If you are unsure if your symptoms are severe or if you have a weakened immune system, talk to a healthcare provider for further guidance. °People who have a weakened immune system should talk to their healthcare provider about the potential for reduced immune responses to  COVID-19 vaccines and the need to continue to follow current prevention measures (including wearing a well-fitting mask and avoiding crowds and poorly ventilated indoor spaces) to protect themselves against COVID-19 until advised otherwise by their healthcare provider. Close contacts of immunocompromised people--including household members--should also be encouraged to receive all recommended COVID-19 vaccine doses to help protect these people. °Isolation in high-risk congregate settings °In certain high-risk congregate settings that have high risk of secondary transmission and where it is not feasible to cohort people (such as correctional and detention facilities, homeless shelters, and cruise ships), CDC recommends a 10-day isolation period for residents. During periods of critical staffing shortages, facilities may consider shortening the isolation period for staff to ensure continuity of operations. Decisions to shorten isolation in these settings should be made in consultation with state, local, tribal, or territorial health departments and should take into consideration the context and characteristics of the facility. CDC's setting-specific guidance provides additional recommendations for these settings. °This CDC guidance is meant to supplement--not replace--any federal, state, local, territorial, or tribal health and safety laws, rules, and regulations. °Recommendations for specific settings °These recommendations do not apply to healthcare professionals. For guidance specific to these settings, see °Healthcare professionals: Interim Guidance for Managing Healthcare Personnel with SARS-CoV-2 Infection or Exposure to SARS-CoV-2 °Patients, residents, and visitors to healthcare settings: Interim Infection Prevention and Control Recommendations for Healthcare Personnel During the Coronavirus Disease 2019 (COVID-19) Pandemic °Additional setting-specific guidance and recommendations are available. °These  recommendations on quarantine and isolation do apply to K[employer]12 School   settings. Additional guidance is available here: Overview of COVID-19 Quarantine for K-12 Schools °Travelers: Travel information and recommendations °Congregate facilities and other settings: guidance pages for community, work, and school settings °Ongoing COVID-19 exposure FAQs °I live with someone with COVID-19, but I cannot be separated from them. How do we manage quarantine in this situation? °It is very important for people with COVID-19 to remain apart from other people, if possible, even if they are living together. If separation of the person with COVID-19 from others that they live with is not possible, the other people that they live with will have ongoing exposure, meaning they will be repeatedly exposed until that person is no longer able to spread the virus to other people. In this situation, there are precautions you can take to limit the spread of COVID-19: °The person with COVID-19 and everyone they live with should wear a well-fitting mask inside the home. °If possible, one person should care for the person with COVID-19 to limit the number of people who are in close contact with the infected person. °Take steps to protect yourself and others to reduce transmission in the home: °Quarantine if you are not up to date with your COVID-19 vaccines. °Isolate if you are sick or tested positive for COVID-19, even if you don't have symptoms. °Learn more about the public health recommendations for testing, mask use and quarantine of close contacts, like yourself, who have ongoing exposure. These recommendations differ depending on your vaccination status. °What should I do if I have ongoing exposure to COVID-19 from someone I live with? °Recommendations for this situation depend on your vaccination status: °If you are not up to date on COVID-19 vaccines and have ongoing exposure to COVID-19, you should: °Begin quarantine immediately and  continue to quarantine throughout the isolation period of the person with COVID-19. °Continue to quarantine for an additional 5 days starting the day after the end of isolation for the person with COVID-19. °Get tested at least 5 days after the end of isolation of the infected person that lives with them. °If you test negative, you can leave the home but should continue to wear a well-fitting mask when around others at home and in public until 10 days after the end of isolation for the person with COVID-19. °Isolate immediately if you develop symptoms of COVID-19 or test positive. °If you are up to date with COVID-19 vaccines and have ongoing exposure to COVID-19, you should: °Get tested at least 5 days after your first exposure. A person with COVID-19 is considered infectious starting 2 days before they develop symptoms, or 2 days before the date of their positive test if they do not have symptoms. °Get tested again at least 5 days after the end of isolation for the person with COVID-19. °Wear a well-fitting mask when you are around the person with COVID-19, and do this throughout their isolation period. °Wear a well-fitting mask around others for 10 days after the infected person's isolation period ends. °Isolate immediately if you develop symptoms of COVID-19 or test positive. °What should I do if multiple people I live with test positive for COVID-19 at different times? °Recommendations for this situation depend on your vaccination status: °If you are not up to date with your COVID-19 vaccines, you should: °Quarantine throughout the isolation period of any infected person that you live with. °Continue to quarantine until 5 days after the end of isolation date for the most recently infected person that lives with you. For example, if   the last day of isolation of the person most recently infected with COVID-19 was June 30, the new 5-day quarantine period starts on July 1. °Get tested at least 5 days after the end  of isolation for the most recently infected person that lives with you. °Wear a well-fitting mask when you are around any person with COVID-19 while that person is in isolation. °Wear a well-fitting mask when you are around other people until 10 days after your last close contact. °Isolate immediately if you develop symptoms of COVID-19 or test positive. °If you are up to date with your COVID-19 vaccines, you should: °Get tested at least 5 days after your first exposure. A person with COVID-19 is considered infectious starting 2 days before they developed symptoms, or 2 days before the date of their positive test if they do not have symptoms. °Get tested again at least 5 days after the end of isolation for the most recently infected person that lives with you. °Wear a well-fitting mask when you are around any person with COVID-19 while that person is in isolation. °Wear a well-fitting mask around others for 10 days after the end of isolation for the most recently infected person that lives with you. For example, if the last day of isolation for the person most recently infected with COVID-19 was June 30, the new 10-day period to wear a well-fitting mask indoors in public starts on July 1. °Isolate immediately if you develop symptoms of COVID-19 or test positive. °I had COVID-19 and completed isolation. Do I have to quarantine or get tested if someone I live with gets COVID-19 shortly after I completed isolation? °No. If you recently completed isolation and someone that lives with you tests positive for the virus that causes COVID-19 shortly after the end of your isolation period, you do not have to quarantine or get tested as long as you do not develop new symptoms. Once all of the people that live together have completed isolation or quarantine, refer to the guidance below for new exposures to COVID-19. °If you had COVID-19 in the previous 90 days and then came into close contact with someone with COVID-19, you do  not have to quarantine or get tested if you do not have symptoms. But you should: °Wear a well-fitting mask indoors in public for 10 days after your last close contact. °Monitor for COVID-19 symptoms for 10 days from the date of your last close contact. °Isolate immediately and get tested if symptoms develop. °If more than 90 days have passed since your recovery from infection, follow CDC's recommendations for close contacts. These recommendations will differ depending on your vaccination status. °02/02/2021 °Content source: National Center for Immunization and Respiratory Diseases (NCIRD), Division of Viral Diseases °This information is not intended to replace advice given to you by your health care provider. Make sure you discuss any questions you have with your health care provider. °Document Revised: 06/08/2021 Document Reviewed: 06/08/2021 °Elsevier Patient Education © 2022 [employer] ° °

## 2022-05-19 NOTE — Assessment & Plan Note (Signed)
No acute distress.  Molnupiravir prescribed.  Discussed how to take the medication and potential side effects.  Discussed over-the-counter symptomatic management.  Counseling on CDC guidelines for quarantine.  She will follow-up if worsening or if she has any other concerns in the next few days.  Advised that if she is getting significantly worse over the weekend that she would need to go to an urgent care.

## 2022-06-01 ENCOUNTER — Encounter: Payer: Self-pay | Admitting: Internal Medicine

## 2022-06-01 DIAGNOSIS — C44729 Squamous cell carcinoma of skin of left lower limb, including hip: Secondary | ICD-10-CM | POA: Diagnosis not present

## 2022-06-01 DIAGNOSIS — L84 Corns and callosities: Secondary | ICD-10-CM | POA: Diagnosis not present

## 2022-06-01 DIAGNOSIS — Z85828 Personal history of other malignant neoplasm of skin: Secondary | ICD-10-CM | POA: Diagnosis not present

## 2022-06-28 ENCOUNTER — Ambulatory Visit: Payer: Medicare Other | Admitting: Dermatology

## 2022-06-28 ENCOUNTER — Other Ambulatory Visit: Payer: Self-pay | Admitting: Cardiology

## 2022-06-28 DIAGNOSIS — I4819 Other persistent atrial fibrillation: Secondary | ICD-10-CM

## 2022-06-28 NOTE — Telephone Encounter (Signed)
Rx request sent to pharmacy.  

## 2022-07-04 DIAGNOSIS — Z85828 Personal history of other malignant neoplasm of skin: Secondary | ICD-10-CM | POA: Diagnosis not present

## 2022-07-04 DIAGNOSIS — C44622 Squamous cell carcinoma of skin of right upper limb, including shoulder: Secondary | ICD-10-CM | POA: Diagnosis not present

## 2022-07-04 DIAGNOSIS — D485 Neoplasm of uncertain behavior of skin: Secondary | ICD-10-CM | POA: Diagnosis not present

## 2022-07-04 DIAGNOSIS — L570 Actinic keratosis: Secondary | ICD-10-CM | POA: Diagnosis not present

## 2022-07-05 ENCOUNTER — Other Ambulatory Visit: Payer: Self-pay | Admitting: Internal Medicine

## 2022-07-05 DIAGNOSIS — Z1231 Encounter for screening mammogram for malignant neoplasm of breast: Secondary | ICD-10-CM

## 2022-07-21 DIAGNOSIS — Z23 Encounter for immunization: Secondary | ICD-10-CM | POA: Diagnosis not present

## 2022-07-27 ENCOUNTER — Ambulatory Visit
Admission: RE | Admit: 2022-07-27 | Discharge: 2022-07-27 | Disposition: A | Discharge status: Home / Self Care | Payer: Medicare Other | Source: Ambulatory Visit | Attending: Internal Medicine | Admitting: Internal Medicine

## 2022-07-27 DIAGNOSIS — Z1231 Encounter for screening mammogram for malignant neoplasm of breast: Secondary | ICD-10-CM

## 2022-08-17 DIAGNOSIS — Z23 Encounter for immunization: Secondary | ICD-10-CM | POA: Diagnosis not present

## 2022-09-13 ENCOUNTER — Ambulatory Visit: Payer: Medicare Other | Admitting: Internal Medicine

## 2022-09-13 ENCOUNTER — Encounter: Payer: Self-pay | Admitting: Internal Medicine

## 2022-09-13 NOTE — Progress Notes (Unsigned)
Subjective:    Patient ID: Danielle Mcgee, female    DOB: [birth year], 88 y.o.   MRN: [id number]     HPI Danielle Mcgee is here for follow up of her chronic medical problems, including Afib, OP, hld, hyperglycemia  Overall feels well and has no concerns.  Denies any changes since she was here last.  Medications and allergies reviewed with patient and updated if appropriate.  Current Outpatient Medications on File Prior to Visit  Medication Sig Dispense Refill   diltiazem (CARDIZEM CD) 120 MG 24 hr capsule TAKE 1 CAPSULE BY MOUTH EVERY DAY 90 capsule 1   ELIQUIS 2.5 MG TABS tablet TAKE 1 TABLET BY MOUTH TWICE A DAY 180 tablet 3   Calcium Carbonate-Vitamin D (CALCIUM + D PO) Take 1,200 mg by mouth daily. (Patient not taking: Reported on 09/14/2022)     ipratropium (ATROVENT) 0.06 % nasal spray Place 2 sprays into both nostrils daily. (Patient not taking: Reported on 09/14/2022) 15 mL 12   Multiple Vitamins-Minerals (CENTRUM SILVER PO) Take 1 tablet by mouth daily. (Patient not taking: Reported on 09/14/2022)     No current facility-administered medications on file prior to visit.     Review of Systems  Constitutional:  Negative for chills and fever.  HENT:  Positive for postnasal drip and voice change.   Respiratory:  Negative for cough, shortness of breath and wheezing.   Cardiovascular:  Negative for chest pain, palpitations and leg swelling.  Gastrointestinal:  Negative for abdominal pain, constipation and diarrhea.       No gerd  Musculoskeletal:  Negative for arthralgias and back pain.  Neurological:  Negative for light-headedness and headaches.  Psychiatric/Behavioral:  Negative for dysphoric mood. The patient is not nervous/anxious.        Objective:   Vitals:   09/14/22 1106  BP: 116/68  Pulse: 76  Temp: 97.7 F (36.5 C)  SpO2: 97%   BP Readings from Last 3 Encounters:  09/14/22 116/68  05/17/22 128/72  04/14/22 116/74   Wt Readings from Last 3  Encounters:  09/14/22 104 lb (47.2 kg)  05/17/22 104 lb (47.2 kg)  04/14/22 105 lb (47.6 kg)   Body mass index is 19.65 kg/m.    Physical Exam Constitutional:      General: She is not in acute distress.    Appearance: Normal appearance.  HENT:     Head: Normocephalic and atraumatic.  Eyes:     Conjunctiva/sclera: Conjunctivae normal.  Cardiovascular:     Rate and Rhythm: Normal rate and regular rhythm.     Heart sounds: Normal heart sounds. No murmur heard. Pulmonary:     Effort: Pulmonary effort is normal. No respiratory distress.     Breath sounds: Normal breath sounds. No wheezing.  Abdominal:     General: There is no distension.     Palpations: Abdomen is soft.     Tenderness: There is no abdominal tenderness.  Musculoskeletal:     Cervical back: Neck supple.     Right lower leg: No edema.     Left lower leg: No edema.  Lymphadenopathy:     Cervical: No cervical adenopathy.  Skin:    General: Skin is warm and dry.     Findings: No rash.  Neurological:     Mental Status: She is alert. Mental status is at baseline.  Psychiatric:        Mood and Affect: Mood normal.        Behavior:  Behavior normal.        Lab Results  Component Value Date   WBC 5.2 04/14/2022   HGB 14.5 04/14/2022   HCT 41.5 04/14/2022   PLT 202 04/14/2022   GLUCOSE 129 (H) 04/14/2022   CHOL 200 01/12/2021   TRIG 61.0 01/12/2021   HDL 84.10 01/12/2021   LDLDIRECT 113.4 06/28/2011   LDLCALC 104 (H) 01/12/2021   ALT 29 01/12/2021   AST 32 01/12/2021   NA 144 04/14/2022   K 4.6 04/14/2022   CL 106 04/14/2022   CREATININE 0.76 04/14/2022   BUN 24 04/14/2022   CO2 23 04/14/2022   TSH 4.56 (H) 01/12/2021   HGBA1C 5.8 06/12/2018     Assessment & Plan:    See Problem List for Assessment and Plan of chronic medical problems.

## 2022-09-13 NOTE — Patient Instructions (Addendum)
      Blood work was ordered.   The lab is on the first floor.    Medications changes include :   start fosamax 70 mg weekly      Return in about 1 year (around 09/15/2023) for follow up.

## 2022-09-14 ENCOUNTER — Encounter: Payer: Self-pay | Admitting: Internal Medicine

## 2022-09-14 ENCOUNTER — Ambulatory Visit (INDEPENDENT_AMBULATORY_CARE_PROVIDER_SITE_OTHER): Payer: Medicare Other | Admitting: Internal Medicine

## 2022-09-14 VITALS — BP 116/68 | HR 76 | Temp 97.7°F | Ht 61.0 in | Wt 104.0 lb

## 2022-09-14 DIAGNOSIS — I4819 Other persistent atrial fibrillation: Secondary | ICD-10-CM

## 2022-09-14 DIAGNOSIS — R7989 Other specified abnormal findings of blood chemistry: Secondary | ICD-10-CM

## 2022-09-14 DIAGNOSIS — R739 Hyperglycemia, unspecified: Secondary | ICD-10-CM

## 2022-09-14 DIAGNOSIS — E782 Mixed hyperlipidemia: Secondary | ICD-10-CM | POA: Diagnosis not present

## 2022-09-14 DIAGNOSIS — M810 Age-related osteoporosis without current pathological fracture: Secondary | ICD-10-CM

## 2022-09-14 LAB — COMPREHENSIVE METABOLIC PANEL
ALT: 21 U/L (ref 0–35)
AST: 27 U/L (ref 0–37)
Albumin: 4.1 g/dL (ref 3.5–5.2)
Alkaline Phosphatase: 105 U/L (ref 39–117)
BUN: 26 mg/dL — ABNORMAL HIGH (ref 6–23)
CO2: 29 mEq/L (ref 19–32)
Calcium: 9.5 mg/dL (ref 8.4–10.5)
Chloride: 101 mEq/L (ref 96–112)
Creatinine, Ser: 0.69 mg/dL (ref 0.40–1.20)
GFR: 76.54 mL/min (ref >60.00)
Glucose, Bld: 90 mg/dL (ref 70–99)
Potassium: 4.4 mEq/L (ref 3.5–5.1)
Sodium: 137 mEq/L (ref 135–145)
Total Bilirubin: 0.5 mg/dL (ref 0.2–1.2)
Total Protein: 6.9 g/dL (ref 6.0–8.3)

## 2022-09-14 LAB — CBC WITH DIFFERENTIAL/PLATELET
Basophils Absolute: 0 10*3/uL (ref 0.0–0.1)
Basophils Relative: 0.6 % (ref 0.0–3.0)
Eosinophils Absolute: 0.1 10*3/uL (ref 0.0–0.7)
Eosinophils Relative: 2.3 % (ref 0.0–5.0)
HCT: 41.9 % (ref 36.0–46.0)
Hemoglobin: 14 g/dL (ref 12.0–15.0)
Lymphocytes Relative: 25.8 % (ref 12.0–46.0)
Lymphs Abs: 1.4 10*3/uL (ref 0.7–4.0)
MCHC: 33.5 g/dL (ref 30.0–36.0)
MCV: 92.9 fl (ref 78.0–100.0)
Monocytes Absolute: 0.6 10*3/uL (ref 0.1–1.0)
Monocytes Relative: 11.6 % (ref 3.0–12.0)
Neutro Abs: 3.1 10*3/uL (ref 1.4–7.7)
Neutrophils Relative %: 59.7 % (ref 43.0–77.0)
Platelets: 188 10*3/uL (ref 150.0–400.0)
RBC: 4.52 Mil/uL (ref 3.87–5.11)
RDW: 13.7 % (ref 11.5–15.5)
WBC: 5.3 10*3/uL (ref 4.0–10.5)

## 2022-09-14 LAB — LIPID PANEL
Cholesterol: 220 mg/dL — ABNORMAL HIGH (ref 0–200)
HDL: 87 mg/dL (ref >39.00)
LDL Cholesterol: 119 mg/dL — ABNORMAL HIGH (ref 0–99)
NonHDL: 133.29
Total CHOL/HDL Ratio: 3
Triglycerides: 73 mg/dL (ref 0.0–149.0)
VLDL: 14.6 mg/dL (ref 0.0–40.0)

## 2022-09-14 LAB — HEMOGLOBIN A1C: Hgb A1c MFr Bld: 5.6 % (ref 4.6–6.5)

## 2022-09-14 LAB — TSH: TSH: 5 u[IU]/mL (ref 0.35–5.50)

## 2022-09-14 LAB — VITAMIN D 25 HYDROXY (VIT D DEFICIENCY, FRACTURES): VITD: 70.75 ng/mL (ref 30.00–100.00)

## 2022-09-14 MED ORDER — CALCIUM + D 500-1000-40 MG-UNT-MCG PO CHEW: 1.0000 | CHEWABLE_TABLET | Freq: Every day | ORAL | Status: AC

## 2022-09-14 MED ORDER — ALENDRONATE SODIUM 70 MG PO TABS: 70.0000 mg | ORAL_TABLET | ORAL | 3 refills | Status: DC

## 2022-09-14 MED ORDER — CENTRUM SILVER PO TABS: 1.0000 | ORAL_TABLET | Freq: Every day | ORAL | Status: AC

## 2022-09-14 NOTE — Assessment & Plan Note (Signed)
Subclinical hypothyroidism Check TSH

## 2022-09-14 NOTE — Assessment & Plan Note (Signed)
Chronic Check a1c Low sugar / carb diet Stressed regular exercise  

## 2022-09-14 NOTE — Assessment & Plan Note (Signed)
Chronic Regular exercise and healthy diet encouraged Check lipid panel  Continue lifestyle control 

## 2022-09-14 NOTE — Assessment & Plan Note (Addendum)
Chronic Last DEXA 3 years ago-reviewed results and discussed increased risk for having a fracture She states she would consider medication Start fosamax 70 mg weekly-discussed possible side effect of GERD, very low risk of jaw necrosis Hold off on dexa for now-we will get DEXA in 1 year Continue calcium and vitamin D Typically walks regularly, but has not been doing that recently-stressed regular walking

## 2022-09-14 NOTE — Assessment & Plan Note (Signed)
Chronic Following with cardiology Asymptomatic In A-fib here today Diltiazem 120 mg daily, Eliquis 2.5 mg daily CBC, CMP, TSH, lipid panel

## 2022-09-20 ENCOUNTER — Encounter: Payer: Self-pay | Admitting: Internal Medicine

## 2022-09-20 ENCOUNTER — Ambulatory Visit (INDEPENDENT_AMBULATORY_CARE_PROVIDER_SITE_OTHER): Payer: Medicare Other | Admitting: Internal Medicine

## 2022-09-20 VITALS — BP 124/80 | HR 70 | Temp 98.1°F | Ht 61.0 in | Wt 104.0 lb

## 2022-09-20 DIAGNOSIS — R42 Dizziness and giddiness: Secondary | ICD-10-CM

## 2022-09-20 MED ORDER — AMOXICILLIN-POT CLAVULANATE 875-125 MG PO TABS: 1.0000 | ORAL_TABLET | Freq: Two times a day (BID) | ORAL | 0 refills | Status: AC

## 2022-09-20 NOTE — Patient Instructions (Addendum)
     Medications changes include : Augmentin twice daily for one week.  Start an otc anti- histamine      Return if symptoms worsen or fail to improve.

## 2022-09-20 NOTE — Assessment & Plan Note (Signed)
Acute Related to changes in position and quick movements-likely in her ear She is experiencing some mild sinus symptoms and wonders about a sinus infection, which is very possible Start Augmentin 875-125 mg twice daily x7 days Start taking over-the-counter antihistamine daily Call if no improvement

## 2022-09-20 NOTE — Progress Notes (Signed)
Subjective:    Patient ID: Danielle Mcgee, female    DOB: [birth year], 88 y.o.   MRN: [id number]      HPI Danielle Mcgee is here for  Chief Complaint  Patient presents with   Dizziness     Yesterday morning she started experiencing dizziness-she will experience it when she made quick movements or changes in position.  If she moves slow she does not have any dizziness.  She felt a couple times in bed rolling over and there was a quick spinning sensation.  Many years ago she had this when she had a cold and she had been flying.  She has noticed some increasing congestion, postnasal drainage, hoarseness and nausea.  She was concerned maybe she had an infection that was causing some of the dizziness.       Medications and allergies reviewed with patient and updated if appropriate.  Current Outpatient Medications on File Prior to Visit  Medication Sig Dispense Refill   alendronate (FOSAMAX) 70 MG tablet Take 1 tablet (70 mg total) by mouth every 7 (seven) days. Take with a full glass of water on an empty stomach. 12 tablet 3   Calcium-Vitamin D-Vitamin K (CALCIUM + D) [phone] MG-UNT-MCG CHEW Chew 1 tablet by mouth daily.     diltiazem (CARDIZEM CD) 120 MG 24 hr capsule TAKE 1 CAPSULE BY MOUTH EVERY DAY 90 capsule 1   ELIQUIS 2.5 MG TABS tablet TAKE 1 TABLET BY MOUTH TWICE A DAY 180 tablet 3   Multiple Vitamins-Minerals (CENTRUM SILVER) tablet Take 1 tablet by mouth daily.     No current facility-administered medications on file prior to visit.    Review of Systems  Constitutional:  Negative for chills and fever.  HENT:  Positive for congestion, postnasal drip and voice change. Negative for ear pain, sinus pressure and sore throat.        Ears popping  Respiratory:  Negative for cough, shortness of breath and wheezing.   Gastrointestinal:  Positive for nausea.  Neurological:  Positive for dizziness. Negative for headaches.       Objective:   Vitals:   09/20/22 1533   BP: 124/80  Pulse: 70  Temp: 98.1 F (36.7 C)  SpO2: 97%   BP Readings from Last 3 Encounters:  09/20/22 124/80  09/14/22 116/68  05/17/22 128/72   Wt Readings from Last 3 Encounters:  09/20/22 104 lb (47.2 kg)  09/14/22 104 lb (47.2 kg)  05/17/22 104 lb (47.2 kg)   Body mass index is 19.65 kg/m.    Physical Exam Constitutional:      General: She is not in acute distress.    Appearance: Normal appearance. She is not ill-appearing.  HENT:     Head: Normocephalic and atraumatic.     Right Ear: Tympanic membrane, ear canal and external ear normal.     Left Ear: Tympanic membrane, ear canal and external ear normal.     Mouth/Throat:     Mouth: Mucous membranes are moist.     Pharynx: No oropharyngeal exudate or posterior oropharyngeal erythema.  Eyes:     Conjunctiva/sclera: Conjunctivae normal.  Cardiovascular:     Rate and Rhythm: Normal rate and regular rhythm.  Pulmonary:     Effort: Pulmonary effort is normal. No respiratory distress.     Breath sounds: Normal breath sounds. No wheezing or rales.  Musculoskeletal:     Cervical back: Neck supple. No tenderness.  Lymphadenopathy:     Cervical: No cervical adenopathy.  Skin:    General: Skin is warm and dry.  Neurological:     Mental Status: She is alert.            Assessment & Plan:    See Problem List for Assessment and Plan of chronic medical problems.

## 2022-10-02 ENCOUNTER — Encounter: Payer: Self-pay | Admitting: Internal Medicine

## 2022-10-02 NOTE — Progress Notes (Deleted)
    Subjective:    Patient ID: Danielle Mcgee, female    DOB: 06/19/1930, 88 y.o.   MRN: [id number]      HPI Danielle Mcgee is here for No chief complaint on file.   Dizziness, nausea -      Medications and allergies reviewed with patient and updated if appropriate.  Current Outpatient Medications on File Prior to Visit  Medication Sig Dispense Refill   alendronate (FOSAMAX) 70 MG tablet Take 1 tablet (70 mg total) by mouth every 7 (seven) days. Take with a full glass of water on an empty stomach. 12 tablet 3   Calcium-Vitamin D-Vitamin K (CALCIUM + D) [phone] MG-UNT-MCG CHEW Chew 1 tablet by mouth daily.     diltiazem (CARDIZEM CD) 120 MG 24 hr capsule TAKE 1 CAPSULE BY MOUTH EVERY DAY 90 capsule 1   ELIQUIS 2.5 MG TABS tablet TAKE 1 TABLET BY MOUTH TWICE A DAY 180 tablet 3   Multiple Vitamins-Minerals (CENTRUM SILVER) tablet Take 1 tablet by mouth daily.     No current facility-administered medications on file prior to visit.    Review of Systems     Objective:  There were no vitals filed for this visit. BP Readings from Last 3 Encounters:  09/20/22 124/80  09/14/22 116/68  05/17/22 128/72   Wt Readings from Last 3 Encounters:  09/20/22 104 lb (47.2 kg)  09/14/22 104 lb (47.2 kg)  05/17/22 104 lb (47.2 kg)   There is no height or weight on file to calculate BMI.    Physical Exam         Assessment & Plan:    See Problem List for Assessment and Plan of chronic medical problems.

## 2022-10-03 ENCOUNTER — Ambulatory Visit: Payer: Medicare Other | Admitting: Internal Medicine

## 2022-10-03 DIAGNOSIS — R42 Dizziness and giddiness: Secondary | ICD-10-CM

## 2022-10-03 NOTE — Progress Notes (Signed)
    Subjective:    Patient ID: Danielle Mcgee, female    DOB: [birth year], 88 y.o.   MRN: [id number]      HPI Danielle Mcgee is here for No chief complaint on file.   Dizziness, nausea -      Medications and allergies reviewed with patient and updated if appropriate.  Current Outpatient Medications on File Prior to Visit  Medication Sig Dispense Refill   alendronate (FOSAMAX) 70 MG tablet Take 1 tablet (70 mg total) by mouth every 7 (seven) days. Take with a full glass of water on an empty stomach. 12 tablet 3   Calcium-Vitamin D-Vitamin K (CALCIUM + D) [phone] MG-UNT-MCG CHEW Chew 1 tablet by mouth daily.     diltiazem (CARDIZEM CD) 120 MG 24 hr capsule TAKE 1 CAPSULE BY MOUTH EVERY DAY 90 capsule 1   ELIQUIS 2.5 MG TABS tablet TAKE 1 TABLET BY MOUTH TWICE A DAY 180 tablet 3   Multiple Vitamins-Minerals (CENTRUM SILVER) tablet Take 1 tablet by mouth daily.     No current facility-administered medications on file prior to visit.    Review of Systems     Objective:  There were no vitals filed for this visit. BP Readings from Last 3 Encounters:  09/20/22 124/80  09/14/22 116/68  05/17/22 128/72   Wt Readings from Last 3 Encounters:  09/20/22 104 lb (47.2 kg)  09/14/22 104 lb (47.2 kg)  05/17/22 104 lb (47.2 kg)   There is no height or weight on file to calculate BMI.    Physical Exam         Assessment & Plan:    See Problem List for Assessment and Plan of chronic medical problems.

## 2022-10-04 ENCOUNTER — Ambulatory Visit (INDEPENDENT_AMBULATORY_CARE_PROVIDER_SITE_OTHER): Payer: Medicare Other | Admitting: Internal Medicine

## 2022-10-04 ENCOUNTER — Encounter: Payer: Self-pay | Admitting: Internal Medicine

## 2022-10-04 VITALS — BP 130/60 | HR 70 | Temp 98.0°F | Ht 61.0 in | Wt 102.0 lb

## 2022-10-04 DIAGNOSIS — R42 Dizziness and giddiness: Secondary | ICD-10-CM

## 2022-10-04 MED ORDER — DIAZEPAM 5 MG PO TABS: 5.0000 mg | ORAL_TABLET | Freq: Two times a day (BID) | ORAL | 0 refills | Status: DC | PRN

## 2022-10-04 NOTE — Assessment & Plan Note (Signed)
Subacute Still experiencing vertigo, some lightheadedness and spacey feeling Some of her symptoms are classic vertigo-spinning sensation with changes in position and some of her other symptoms are more nonspecific I do think she has an element of BPPV Has been taking Benadryl Trial of Valium 5 mg every 12 hours as needed-discussed possible drowsiness-can try half a pill if needed Referral for vestibular physical therapy Call if no improvement

## 2022-10-04 NOTE — Patient Instructions (Addendum)
     Medications changes include :   valium 5 mg twice daily if needed --- if this causes too much drowsiness try 1/2 a pill.     A referral was ordered for physical therapy.     Someone will call you to schedule an appointment.     Return if symptoms worsen or fail to improve.

## 2022-10-10 ENCOUNTER — Ambulatory Visit: Payer: Medicare Other | Attending: Internal Medicine | Admitting: Physical Therapy

## 2022-10-10 ENCOUNTER — Encounter: Payer: Self-pay | Admitting: Physical Therapy

## 2022-10-10 ENCOUNTER — Other Ambulatory Visit: Payer: Self-pay

## 2022-10-10 DIAGNOSIS — R2681 Unsteadiness on feet: Secondary | ICD-10-CM | POA: Insufficient documentation

## 2022-10-10 DIAGNOSIS — R42 Dizziness and giddiness: Secondary | ICD-10-CM | POA: Insufficient documentation

## 2022-10-10 NOTE — Therapy (Signed)
OUTPATIENT PHYSICAL THERAPY VESTIBULAR EVALUATION     Patient Name: Danielle Mcgee MRN: [id number] DOB:1930-06-23, 88 y.o., female Today's Date: 10/10/2022  END OF SESSION:  PT End of Session - 10/10/22 1107     Visit Number 1    Number of Visits 8    Date for PT Re-Evaluation 11/03/22    Authorization Type Medicare/[employer]    PT Start Time 1105    PT Stop Time 1147    PT Time Calculation (min) 42 min    Activity Tolerance Patient tolerated treatment well    Behavior During Therapy WFL for tasks assessed/performed             Past Medical History:  Diagnosis Date   Benign breast disease 2000   DJD (degenerative joint disease)    Generalized headaches    Heart murmur    Hyperlipidemia    Mitral click-murmur syndrome    nomitral  reguritation   Osteopenia    Temporal arteritis ([place])    2013-14   Past Surgical History:  Procedure Laterality Date   APPENDECTOMY  1962   ARTERY BIOPSY  09/20/2011   Procedure: BIOPSY TEMPORAL ARTERY;  Surgeon: Adin Hector, MD;  Location: [facility];  Service: General;  Laterality: Left;   BREAST BIOPSY  2000   Atypical Lobular Hyperplasia   CARDIOVERSION N/A 02/11/2021   Procedure: CARDIOVERSION;  Surgeon: Buford Dresser, MD;  Location: [facility];  Service: Cardiovascular;  Laterality: N/A;   CATARACT EXTRACTION     bilaterally   COLONOSCOPY  2002 & 2013   Dr Olevia Perches   ELBOW SURGERY  2009   lt fx   ENDOSCOPIC TURBINATE REDUCTION Bilateral 10/06/2015   Procedure: ENDOSCOPIC TURBINATE REDUCTION;  Surgeon: Jerrell Belfast, MD;  Location: [facility];  Service: ENT;  Laterality: Bilateral;   EYE SURGERY     FRACTURE SURGERY Bilateral    wrist   JOINT REPLACEMENT  2001   both-knees   SINUS ENDO W/FUSION Left 10/06/2015   Procedure: LEFT ENDOSCOPIC SINUS SURGERY WITH MAXILLARY ANTROSTOMY AND REMOVAL OF DISEASED TISSUE ;  Surgeon: Jerrell Belfast, MD;  Location: [facility];  Service: ENT;  Laterality: Left;   TOTAL KNEE  ARTHROPLASTY     bilaterally   Patient Active Problem List   Diagnosis Date Noted   Vertigo 09/20/2022   COVID-19 virus infection 05/19/2022   Skin lesion of left lower extremity 05/17/2022   Acute cough 03/10/2022   Gross hematuria 03/10/2022   Chronic sinusitis 10/17/2021   Persistent atrial fibrillation ([place])    Rhinorrhea 01/12/2021   Elevated TSH 01/11/2021   Onychomycosis of toenail 06/16/2019   Mixed stress and urge urinary incontinence 06/11/2017   Hyperglycemia 06/10/2017   Paresthesia and pain of right extremity 06/16/2016   Mild cardiomegaly 09/04/2015   Perennial allergic rhinitis 03/13/2014   Temporal arteritis ([place]) 09/20/2011   Hyperlipidemia 04/19/2009   Osteoporosis 04/19/2009    PCP: Binnie Rail, MD  REFERRING PROVIDER: Binnie Rail, MD   REFERRING DIAG: R42 (ICD-10-CM) - Vertigo   THERAPY DIAG:  Dizziness and giddiness  Unsteadiness on feet  ONSET DATE: 10/04/2022 (MD referral)  Rationale for Evaluation and Treatment: Rehabilitation  SUBJECTIVE:   SUBJECTIVE STATEMENT: Have had long bouts of post nasal drip, but it flares up in the fall.  Saw Dr. Quay Burow and she gave me antiobiotic.  Had several times of nausea and unsteadiness/wooziness around Thanksgiving.  Was worse getting up and better during the day. Pt accompanied by: self  PERTINENT HISTORY: Per 11/29 MD note:  Dizziness, nausea - she took the abx and benadryl for Sinus infection.  It may help some of her symptoms-she is not sure.  She still feels nauseated at times and has head pressure.  She feels spacy.  She does get transient spinning sensation when she lays down in bed.  She has some intermittent lightheadedness.   See PMH above  PAIN:  Are you having pain? No  PRECAUTIONS: Fall  WEIGHT BEARING RESTRICTIONS: No  FALLS: Has patient fallen in last 6 months? No  LIVING ENVIRONMENT: Lives with: lives with their partner Lives in: House/apartment Stairs: Yes: Internal: 13 steps;  on right going up Has following equipment at home: None  PLOF: Independent  PATIENT GOALS: Pt's goals for therapy are to improve dizziness and feel normal again.  OBJECTIVE:   DIAGNOSTIC FINDINGS: NA for this episode of care  COGNITION: Overall cognitive status: Within functional limits for tasks assessed    POSTURE:  rounded shoulders  Cervical ROM:  WFL  Active A/PROM (deg) eval  Flexion   Extension   Right lateral flexion   Left lateral flexion   Right rotation   Left rotation   (Blank rows = not tested)   BED MOBILITY:  Independent  TRANSFERS: Assistive device utilized: None  Sit to stand: Complete Independence Stand to sit: Complete Independence   PATIENT SURVEYS:  FOTO Intake score 58; predicted score 64  VESTIBULAR ASSESSMENT:  GENERAL OBSERVATION: No acute distress, "doing all the things, but I just don't feel right"   SYMPTOM BEHAVIOR:  Subjective history:  Had sinus infection/full head and was given antibiotic.  Head pressure, "icky stomach"; wooziness and unsteadiness; sometimes worse in the mornings.  When I lie down, I lie down very slowly, and the room spins for a second and then goes away.    Non-Vestibular symptoms: nausea/vomiting and sinus pressure  Type of dizziness: Spinning/Vertigo, Unsteady with head/body turns, "Funny feeling in the head", and icky/nausea feeling in the stomach  Frequency: spinning (daily with bed mobility) Nausea is off and on  Duration: seconds (spinning)  Aggravating factors: Induced by position change: lying supine and rolling to the right and Induced by motion: bending down to the ground and turning body quickly  Relieving factors: head stationary and lying supine  Progression of symptoms: better  OCULOMOTOR EXAM:  Ocular Alignment: normal  Ocular ROM: No Limitations  Spontaneous Nystagmus: absent  Gaze-Induced Nystagmus: absent  Smooth Pursuits: intact  Saccades: intact    VESTIBULAR - OCULAR REFLEX:    Slow VOR: Normal  VOR Cancellation: Comment: slowed self-selected speed  Head-Impulse Test: HIT Right: positive    POSITIONAL TESTING: Right Dix-Hallpike: upbeating, right nystagmus and Duration: 5 sec, latency for onset of nystagmus Left Dix-Hallpike: no nystagmus and reports "feeling funny, but not spinning" Right Roll Test: no nystagmus Left Roll Test: no nystagmus   M-CTSIB  Condition 1: Firm Surface, EO 30 Sec, Normal Sway  Condition 2: Firm Surface, EC 30 Sec, Normal Sway  Condition 3: Foam Surface, EO 30 Sec, Mild Sway  Condition 4: Foam Surface, EC 24.13 Sec, Severe Sway      VESTIBULAR TREATMENT:  DATE: 10/10/2022  Canalith Repositioning:  Epley Right: Number of Reps: 2, Response to Treatment: symptoms improved, and Comment: no symptoms at 2nd Dix-Hallpike, Epley  PATIENT EDUCATION: Education details: Eval results, POC, rationale for BPPV treatment Person educated: Patient Education method: Explanation Education comprehension: verbalized understanding  HOME EXERCISE PROGRAM:  GOALS: Goals reviewed with patient? Yes  SHORT TERM GOALS: = LTGs  LONG TERM GOALS: Target date: 11/03/2022  Pt will be independent with HEP for improved dizziness.  Baseline:  Goal status: INITIAL  2.  Pt will improve FOTO to 64 for improved functional outcome measure for dizziness. Baseline: 58 at baseline Goal status: INITIAL  3.  Pt will report no dizziness with bed mobility. Baseline: mild dizziness, cautious Goal status: INITIAL  4.  Pt will improve Condition 4 on MCTSIB to 30 seconds with mild-moderate sway for improved vestibular system use for balance. Baseline: 24 sec before opening eyes Goal status: INITIAL  ASSESSMENT:  CLINICAL IMPRESSION: Patient is a 88 y.o. female who was seen today for physical therapy evaluation and treatment for dizziness and vertigo.   She  reports onset in the past month or so, with some spinning with lying down in bed, that resolves quickly, with otherwise some nausea and general cautious movement patterns.  Oculomotor testing today is [facility], except for R HIT.  With positional testing, pt demonstrates positive Dix-Hallpike, indicating R posterior canal BPPV.  Performed Epley maneuver, with second Dix-Hallpike negative and no c/o dizziness.  Pt also demonstrates difficulty condition 4 on MCTSIB.  That with positive R HIT indicate decreased vestibular system use for balance.  She will benefit from skilled PT to address the above stated deficits to improve dizziness and overall balance/functional mobility.   OBJECTIVE IMPAIRMENTS: decreased balance and dizziness.   ACTIVITY LIMITATIONS: bending, bed mobility, and locomotion level  PARTICIPATION LIMITATIONS: community activity  PERSONAL FACTORS: 3+ comorbidities: See above  are also affecting patient's functional outcome.   REHAB POTENTIAL: Good  CLINICAL DECISION MAKING: Stable/uncomplicated  EVALUATION COMPLEXITY: Low   PLAN:  PT FREQUENCY: 2x/week  PT DURATION: 4 weeks (including eval)  PLANNED INTERVENTIONS: Therapeutic exercises, Therapeutic activity, Neuromuscular re-education, Balance training, Gait training, Patient/Family education, Self Care, Vestibular training, and Canalith repositioning  PLAN FOR NEXT SESSION: Reassess for positional vertigo and treat as needed.  Consider giving Brandt-Daroff for HEP.  Work on compliant surfaces for balance to add to [employer] PT 10/10/2022, 3:12 PM  [facility] at [facility], [place], [place] [address] Phone # [phone] Fax # [phone]

## 2022-10-12 ENCOUNTER — Ambulatory Visit: Payer: Medicare Other | Admitting: Physical Therapy

## 2022-10-12 ENCOUNTER — Encounter: Payer: Self-pay | Admitting: Physical Therapy

## 2022-10-12 DIAGNOSIS — R42 Dizziness and giddiness: Secondary | ICD-10-CM | POA: Diagnosis not present

## 2022-10-12 DIAGNOSIS — R2681 Unsteadiness on feet: Secondary | ICD-10-CM

## 2022-10-12 NOTE — Therapy (Signed)
OUTPATIENT PHYSICAL THERAPY VESTIBULAR TREATMENT NOTE     Patient Name: Danielle Mcgee MRN: [id number] DOB:Mar 06, 1930, 88 y.o., female Today's Date: 10/12/2022  END OF SESSION:  PT End of Session - 10/12/22 1400     Visit Number 2    Number of Visits 8    Date for PT Re-Evaluation 11/03/22    Authorization Type Medicare/[employer]    PT Start Time 1400    PT Stop Time 1438    PT Time Calculation (min) 38 min    Activity Tolerance Patient tolerated treatment well    Behavior During Therapy WFL for tasks assessed/performed              Past Medical History:  Diagnosis Date   Benign breast disease 2000   DJD (degenerative joint disease)    Generalized headaches    Heart murmur    Hyperlipidemia    Mitral click-murmur syndrome    nomitral  reguritation   Osteopenia    Temporal arteritis ([place])    2013-14   Past Surgical History:  Procedure Laterality Date   APPENDECTOMY  1962   ARTERY BIOPSY  09/20/2011   Procedure: BIOPSY TEMPORAL ARTERY;  Surgeon: Adin Hector, MD;  Location: [facility];  Service: General;  Laterality: Left;   BREAST BIOPSY  2000   Atypical Lobular Hyperplasia   CARDIOVERSION N/A 02/11/2021   Procedure: CARDIOVERSION;  Surgeon: Buford Dresser, MD;  Location: [facility];  Service: Cardiovascular;  Laterality: N/A;   CATARACT EXTRACTION     bilaterally   COLONOSCOPY  2002 & 2013   Dr Olevia Perches   ELBOW SURGERY  2009   lt fx   ENDOSCOPIC TURBINATE REDUCTION Bilateral 10/06/2015   Procedure: ENDOSCOPIC TURBINATE REDUCTION;  Surgeon: Jerrell Belfast, MD;  Location: [facility];  Service: ENT;  Laterality: Bilateral;   EYE SURGERY     FRACTURE SURGERY Bilateral    wrist   JOINT REPLACEMENT  2001   both-knees   SINUS ENDO W/FUSION Left 10/06/2015   Procedure: LEFT ENDOSCOPIC SINUS SURGERY WITH MAXILLARY ANTROSTOMY AND REMOVAL OF DISEASED TISSUE ;  Surgeon: Jerrell Belfast, MD;  Location: [facility];  Service: ENT;  Laterality: Left;   TOTAL KNEE  ARTHROPLASTY     bilaterally   Patient Active Problem List   Diagnosis Date Noted   Vertigo 09/20/2022   COVID-19 virus infection 05/19/2022   Skin lesion of left lower extremity 05/17/2022   Acute cough 03/10/2022   Gross hematuria 03/10/2022   Chronic sinusitis 10/17/2021   Persistent atrial fibrillation ([place])    Rhinorrhea 01/12/2021   Elevated TSH 01/11/2021   Onychomycosis of toenail 06/16/2019   Mixed stress and urge urinary incontinence 06/11/2017   Hyperglycemia 06/10/2017   Paresthesia and pain of right extremity 06/16/2016   Mild cardiomegaly 09/04/2015   Perennial allergic rhinitis 03/13/2014   Temporal arteritis ([place]) 09/20/2011   Hyperlipidemia 04/19/2009   Osteoporosis 04/19/2009    PCP: Binnie Rail, MD  REFERRING PROVIDER: Binnie Rail, MD   REFERRING DIAG: R42 (ICD-10-CM) - Vertigo   THERAPY DIAG:  Dizziness and giddiness  Unsteadiness on feet  ONSET DATE: 10/04/2022 (MD referral)  Rationale for Evaluation and Treatment: Rehabilitation  SUBJECTIVE:   SUBJECTIVE STATEMENT: Feel so much better.  No problems or dizziness with bed mobility last night. Pt accompanied by: self  PERTINENT HISTORY: Per 11/29 MD note:  Dizziness, nausea - she took the abx and benadryl for Sinus infection.  It may help some of her symptoms-she is  not sure.  She still feels nauseated at times and has head pressure.  She feels spacy.  She does get transient spinning sensation when she lays down in bed.  She has some intermittent lightheadedness.   See PMH above  PAIN:  Are you having pain? No  PRECAUTIONS: Fall  WEIGHT BEARING RESTRICTIONS: No  FALLS: Has patient fallen in last 6 months? No  LIVING ENVIRONMENT: Lives with: lives with their partner Lives in: House/apartment Stairs: Yes: Internal: 13 steps; on right going up Has following equipment at home: None  PLOF: Independent  PATIENT GOALS: Pt's goals for therapy are to improve dizziness and feel normal  again.  OBJECTIVE:    TODAY'S TREATMENT: 10/12/2022 Activity Comments  DGI score:  19/24 Scores <19/24 indicate increased fall risk  R Dix-Hallpike Negative  L Dix-Hallpike Negative  R roll test Negative  L roll test Negative  Brandt-Daroff performed R and L No symptoms  Standing balance exercises: Feet apart EO/EC head turns/nods Feet together EO/EC head turns/nods Mild LOB with UE support with EC feet together   Access Code: [email or web address] URL: [email or web address] Date: 10/12/2022 Prepared by: [facility]  Exercises - Brandt-Daroff Vestibular Exercise  - 1 x daily - 7 x weekly - 3 sets - 10 reps - Standing Balance in Corner  - 1 x daily - 7 x weekly - 1 sets - 5-10 reps - Corner Balance Feet Together With Eyes Open  - 1 x daily - 7 x weekly - 1 sets - 5-10 reps  PATIENT EDUCATION: Education details: HEP, rationale/explanation of BPPV assessment/treatment Person educated: Patient Education method: Explanation, Demonstration, and Handouts Education comprehension: verbalized understanding and returned demonstration  ------------------------------------------------------------------------------------------------- Objective measures below performed at initial eval:   DIAGNOSTIC FINDINGS: NA for this episode of care  COGNITION: Overall cognitive status: Within functional limits for tasks assessed    POSTURE:  rounded shoulders  Cervical ROM:  WFL  Active A/PROM (deg) eval  Flexion   Extension   Right lateral flexion   Left lateral flexion   Right rotation   Left rotation   (Blank rows = not tested)   BED MOBILITY:  Independent  TRANSFERS: Assistive device utilized: None  Sit to stand: Complete Independence Stand to sit: Complete Independence   PATIENT SURVEYS:  FOTO Intake score 58; predicted score 64  VESTIBULAR ASSESSMENT:  GENERAL OBSERVATION: No acute distress, "doing all the things, but I just don't  feel right"   SYMPTOM BEHAVIOR:  Subjective history:  Had sinus infection/full head and was given antibiotic.  Head pressure, "icky stomach"; wooziness and unsteadiness; sometimes worse in the mornings.  When I lie down, I lie down very slowly, and the room spins for a second and then goes away.    Non-Vestibular symptoms: nausea/vomiting and sinus pressure  Type of dizziness: Spinning/Vertigo, Unsteady with head/body turns, "Funny feeling in the head", and icky/nausea feeling in the stomach  Frequency: spinning (daily with bed mobility) Nausea is off and on  Duration: seconds (spinning)  Aggravating factors: Induced by position change: lying supine and rolling to the right and Induced by motion: bending down to the ground and turning body quickly  Relieving factors: head stationary and lying supine  Progression of symptoms: better  OCULOMOTOR EXAM:  Ocular Alignment: normal  Ocular ROM: No Limitations  Spontaneous Nystagmus: absent  Gaze-Induced Nystagmus: absent  Smooth Pursuits: intact  Saccades: intact    VESTIBULAR - OCULAR REFLEX:   Slow VOR: Normal  VOR Cancellation: Comment: slowed self-selected speed  Head-Impulse Test: HIT Right: positive    POSITIONAL TESTING: Right Dix-Hallpike: upbeating, right nystagmus and Duration: 5 sec, latency for onset of nystagmus Left Dix-Hallpike: no nystagmus and reports "feeling funny, but not spinning" Right Roll Test: no nystagmus Left Roll Test: no nystagmus   M-CTSIB  Condition 1: Firm Surface, EO 30 Sec, Normal Sway  Condition 2: Firm Surface, EC 30 Sec, Normal Sway  Condition 3: Foam Surface, EO 30 Sec, Mild Sway  Condition 4: Foam Surface, EC 24.13 Sec, Severe Sway      VESTIBULAR TREATMENT:                                                                                                   DATE: 10/10/2022  Canalith Repositioning:  Epley Right: Number of Reps: 2, Response to Treatment: symptoms improved, and Comment: no  symptoms at 2nd Dix-Hallpike, Epley  PATIENT EDUCATION: Education details: Eval results, POC, rationale for BPPV treatment Person educated: Patient Education method: Explanation Education comprehension: verbalized understanding  HOME EXERCISE PROGRAM:  GOALS: Goals reviewed with patient? Yes  SHORT TERM GOALS: = LTGs  LONG TERM GOALS: Target date: 11/03/2022  Pt will be independent with HEP for improved dizziness.  Baseline:  Goal status: INITIAL  2.  Pt will improve FOTO to 64 for improved functional outcome measure for dizziness. Baseline: 58 at baseline Goal status: INITIAL  3.  Pt will report no dizziness with bed mobility. Baseline: mild dizziness, cautious Goal status: INITIAL  4.  Pt will improve Condition 4 on MCTSIB to 30 seconds with mild-moderate sway for improved vestibular system use for balance. Baseline: 24 sec before opening eyes Goal status: INITIAL  ASSESSMENT:  CLINICAL IMPRESSION: Reassessed positional testing today with pt not having any nystagmus or symptoms in posterior canal or horizontal canals.  Assessed DGI today, with score 19/24 indicating increased fall risk.  Added Brandt-Daroff to HEP today as well as standing balance exercises.  She is progressing well and feels good with no c/o at end of session today.  OBJECTIVE IMPAIRMENTS: decreased balance and dizziness.   ACTIVITY LIMITATIONS: bending, bed mobility, and locomotion level  PARTICIPATION LIMITATIONS: community activity  PERSONAL FACTORS: 3+ comorbidities: See above  are also affecting patient's functional outcome.   REHAB POTENTIAL: Good  CLINICAL DECISION MAKING: Stable/uncomplicated  EVALUATION COMPLEXITY: Low   PLAN:  PT FREQUENCY: 2x/week  PT DURATION: 4 weeks (including eval)  PLANNED INTERVENTIONS: Therapeutic exercises, Therapeutic activity, Neuromuscular re-education, Balance training, Gait training, Patient/Family education, Self Care, Vestibular training, and  Canalith repositioning  PLAN FOR NEXT SESSION: Reassess for positional vertigo and treat as needed.  Review and progress HEP.  Work on compliant surfaces for balance to add to HEP.  If no BPPV next visit, may consider hold/discharge.   Frazier Butt., PT 10/12/2022, 2:42 PM  [facility] at [facility], [place], [place] [address] Phone # [phone] Fax # [phone]

## 2022-10-17 NOTE — Therapy (Addendum)
     Patient arrived to session today with the below subjective report: "I'm fine" the one treatment she did took care of my problem. Declines positional testing today. HEP is easy.     Patient declined session today despite educating her on balance testing last session indicating slight increased risk of falls. Patient left without being seen.     Janene Harvey, PT, DPT 10/18/22 12:41 PM  [facility] at [facility] [address] [place], [place], [place] [address] Phone # [phone] Fax # [phone]   PHYSICAL THERAPY DISCHARGE SUMMARY  Visits from Start of Care: 2  Current functional level related to goals / functional outcomes: Unable to assess- patient declined treatment on 10/18/22   Remaining deficits: Pt reports resolution of dizziness   Education / Equipment: HEP  Plan: Patient agrees to discharge.  Patient goals were not met. Patient is being discharged due to declining further treatment.      Janene Harvey, PT, DPT 11/22/22 10:36 AM  [facility] at [facility] [address] [place], [place], [place] [address] Phone # [phone] Fax # [phone]

## 2022-10-18 ENCOUNTER — Ambulatory Visit: Payer: Medicare Other | Admitting: Physical Therapy

## 2022-10-18 ENCOUNTER — Encounter: Payer: Self-pay | Admitting: Physical Therapy

## 2022-10-18 DIAGNOSIS — R2681 Unsteadiness on feet: Secondary | ICD-10-CM

## 2022-10-18 DIAGNOSIS — R42 Dizziness and giddiness: Secondary | ICD-10-CM

## 2022-12-26 ENCOUNTER — Other Ambulatory Visit: Payer: Self-pay | Admitting: Cardiology

## 2022-12-26 DIAGNOSIS — I4819 Other persistent atrial fibrillation: Secondary | ICD-10-CM

## 2022-12-26 NOTE — Telephone Encounter (Signed)
Rx request sent to pharmacy.  

## 2023-01-03 ENCOUNTER — Other Ambulatory Visit (HOSPITAL_BASED_OUTPATIENT_CLINIC_OR_DEPARTMENT_OTHER): Payer: Self-pay | Admitting: Cardiology

## 2023-01-03 DIAGNOSIS — I4821 Permanent atrial fibrillation: Secondary | ICD-10-CM

## 2023-01-03 NOTE — Telephone Encounter (Signed)
Prescription refill request for Eliquis received. Indication: Afib  Last office visit: 04/14/22 Harrell Gave)  Scr: 0.69 (09/14/22)  Age: 89 Weight: 46.3kg  Appropriate dose. Refill sent.

## 2023-02-17 DIAGNOSIS — Z23 Encounter for immunization: Secondary | ICD-10-CM | POA: Diagnosis not present

## 2023-02-26 NOTE — Progress Notes (Signed)
Subjective:    Patient ID: Danielle Mcgee, female    DOB: 05-18-1930, 89 y.o.   MRN: [id number]      HPI Danielle Mcgee is here for a Physical exam and her chronic medical problems.      Medications and allergies reviewed with patient and updated if appropriate.  Current Outpatient Medications on File Prior to Visit  Medication Sig Dispense Refill  . Calcium-Vitamin D-Vitamin K (CALCIUM + D) [phone] MG-UNT-MCG CHEW Chew 1 tablet by mouth daily.    Marland Kitchen diltiazem (CARDIZEM CD) 120 MG 24 hr capsule TAKE 1 CAPSULE BY MOUTH EVERY DAY 90 capsule 1  . ELIQUIS 2.5 MG TABS tablet TAKE 1 TABLET BY MOUTH TWICE A DAY 180 tablet 3  . Multiple Vitamins-Minerals (CENTRUM SILVER) tablet Take 1 tablet by mouth daily.     No current facility-administered medications on file prior to visit.    Review of Systems     Objective:  There were no vitals filed for this visit. There were no vitals filed for this visit. There is no height or weight on file to calculate BMI.  BP Readings from Last 3 Encounters:  04/17/23 130/72  03/05/23 128/80  10/04/22 130/60    Wt Readings from Last 3 Encounters:  04/17/23 104 lb 11.2 oz (47.5 kg)  03/05/23 102 lb (46.3 kg)  10/04/22 102 lb (46.3 kg)       Physical Exam Constitutional: She appears well-developed and well-nourished. No distress.  HENT:  Head: Normocephalic and atraumatic.  Right Ear: External ear normal. Normal ear canal and TM Left Ear: External ear normal.  Normal ear canal and TM Mouth/Throat: Oropharynx is clear and moist.  Eyes: Conjunctivae normal.  Neck: Neck supple. No tracheal deviation present. No thyromegaly present.  No carotid bruit  Cardiovascular: Normal rate, regular rhythm and normal heart sounds.   No murmur heard.  No edema. Pulmonary/Chest: Effort normal and breath sounds normal. No respiratory distress. She has no wheezes. She has no rales.  Breast: deferred   Abdominal: Soft. She exhibits no distension.  There is no tenderness.  Lymphadenopathy: She has no cervical adenopathy.  Skin: Skin is warm and dry. She is not diaphoretic.  Psychiatric: She has a normal mood and affect. Her behavior is normal.     Lab Results  Component Value Date   WBC 4.7 03/05/2023   HGB 14.4 03/05/2023   HCT 43.2 03/05/2023   PLT 196.0 03/05/2023   GLUCOSE 113 (H) 03/05/2023   CHOL 208 (H) 03/05/2023   TRIG 62.0 03/05/2023   HDL 86.30 03/05/2023   LDLDIRECT 113.4 06/28/2011   LDLCALC 109 (H) 03/05/2023   ALT 32 03/05/2023   AST 37 03/05/2023   NA 137 03/05/2023   K 4.7 03/05/2023   CL 101 03/05/2023   CREATININE 0.73 03/05/2023   BUN 33 (H) 03/05/2023   CO2 28 03/05/2023   TSH 4.10 03/05/2023   HGBA1C 5.5 03/05/2023         Assessment & Plan:   Physical exam: Screening blood work  ordered Exercise   Weight   Substance abuse  none   Reviewed recommended immunizations.   Health Maintenance  Topic Date Due  . Medicare Annual Wellness (AWV)  06/13/2019  . COVID-19 Vaccine (7 - 2023-24 season) 04/14/2023  . DEXA SCAN  03/04/2024 (Originally 07/20/2021)  . INFLUENZA VACCINE  06/07/2023  . DTaP/Tdap/Td (3 - Td or Tdap) 09/02/2025  . Pneumonia Vaccine 64+ Years old  Completed  . HPV  VACCINES  Aged Out  . Zoster Vaccines- Shingrix  Discontinued          See Problem List for Assessment and Plan of chronic medical problems.     This encounter was created in error - please disregard.

## 2023-02-26 NOTE — Patient Instructions (Addendum)
Blood work was ordered.   The lab is on the first floor.    Medications changes include :       A referral was ordered for XXX.     Someone will call you to schedule an appointment.    Return in about 1 year (around 02/27/2024) for Physical Exam, Schedule DEXA-[place].   Health Maintenance, Female Adopting a healthy lifestyle and getting preventive care are important in promoting health and wellness. Ask your health care provider about: The right schedule for you to have regular tests and exams. Things you can do on your own to prevent diseases and keep yourself healthy. What should I know about diet, weight, and exercise? Eat a healthy diet  Eat a diet that includes plenty of vegetables, fruits, low-fat dairy products, and lean protein. Do not eat a lot of foods that are high in solid fats, added sugars, or sodium. Maintain a healthy weight Body mass index (BMI) is used to identify weight problems. It estimates body fat based on height and weight. Your health care provider can help determine your BMI and help you achieve or maintain a healthy weight. Get regular exercise Get regular exercise. This is one of the most important things you can do for your health. Most adults should: Exercise for at least 150 minutes each week. The exercise should increase your heart rate and make you sweat (moderate-intensity exercise). Do strengthening exercises at least twice a week. This is in addition to the moderate-intensity exercise. Spend less time sitting. Even light physical activity can be beneficial. Watch cholesterol and blood lipids Have your blood tested for lipids and cholesterol at 87 years of age, then have this test every 5 years. Have your cholesterol levels checked more often if: Your lipid or cholesterol levels are high. You are older than 87 years of age. You are at high risk for heart disease. What should I know about cancer screening? Depending on your health history  and family history, you may need to have cancer screening at various ages. This may include screening for: Breast cancer. Cervical cancer. Colorectal cancer. Skin cancer. Lung cancer. What should I know about heart disease, diabetes, and high blood pressure? Blood pressure and heart disease High blood pressure causes heart disease and increases the risk of stroke. This is more likely to develop in people who have high blood pressure readings or are overweight. Have your blood pressure checked: Every 3-5 years if you are 7-82 years of age. Every year if you are 27 years old or older. Diabetes Have regular diabetes screenings. This checks your fasting blood sugar level. Have the screening done: Once every three years after age 43 if you are at a normal weight and have a low risk for diabetes. More often and at a younger age if you are overweight or have a high risk for diabetes. What should I know about preventing infection? Hepatitis B If you have a higher risk for hepatitis B, you should be screened for this virus. Talk with your health care provider to find out if you are at risk for hepatitis B infection. Hepatitis C Testing is recommended for: Everyone born from [birth year] through 1965. Anyone with known risk factors for hepatitis C. Sexually transmitted infections (STIs) Get screened for STIs, including gonorrhea and chlamydia, if: You are sexually active and are younger than 87 years of age. You are older than 87 years of age and your health care provider tells you that you  are at risk for this type of infection. Your sexual activity has changed since you were last screened, and you are at increased risk for chlamydia or gonorrhea. Ask your health care provider if you are at risk. Ask your health care provider about whether you are at high risk for HIV. Your health care provider may recommend a prescription medicine to help prevent HIV infection. If you choose to take medicine to prevent  HIV, you should first get tested for HIV. You should then be tested every 3 months for as long as you are taking the medicine. Pregnancy If you are about to stop having your period (premenopausal) and you may become pregnant, seek counseling before you get pregnant. Take 400 to 800 micrograms (mcg) of folic acid every day if you become pregnant. Ask for birth control (contraception) if you want to prevent pregnancy. Osteoporosis and menopause Osteoporosis is a disease in which the bones lose minerals and strength with aging. This can result in bone fractures. If you are 66 years old or older, or if you are at risk for osteoporosis and fractures, ask your health care provider if you should: Be screened for bone loss. Take a calcium or vitamin D supplement to lower your risk of fractures. Be given hormone replacement therapy (HRT) to treat symptoms of menopause. Follow these instructions at home: Alcohol use Do not drink alcohol if: Your health care provider tells you not to drink. You are pregnant, may be pregnant, or are planning to become pregnant. If you drink alcohol: Limit how much you have to: 0-1 drink a day. Know how much alcohol is in your drink. In the [place] one drink equals one 12 oz bottle of beer (355 mL), one 5 oz glass of wine (148 mL), or one 1 oz glass of hard liquor (44 mL). Lifestyle Do not use any products that contain nicotine or tobacco. These products include cigarettes, chewing tobacco, and vaping devices, such as e-cigarettes. If you need help quitting, ask your health care provider. Do not use street drugs. Do not share needles. Ask your health care provider for help if you need support or information about quitting drugs. General instructions Schedule regular health, dental, and eye exams. Stay current with your vaccines. Tell your health care provider if: You often feel depressed. You have ever been abused or do not feel safe at home. Summary Adopting a  healthy lifestyle and getting preventive care are important in promoting health and wellness. Follow your health care provider's instructions about healthy diet, exercising, and getting tested or screened for diseases. Follow your health care provider's instructions on monitoring your cholesterol and blood pressure. This information is not intended to replace advice given to you by your health care provider. Make sure you discuss any questions you have with your health care provider. Document Revised: 03/14/2021 Document Reviewed: 03/14/2021 Elsevier Patient Education  2023 [employer].

## 2023-02-27 ENCOUNTER — Encounter: Payer: 59 | Admitting: Internal Medicine

## 2023-02-27 DIAGNOSIS — R42 Dizziness and giddiness: Secondary | ICD-10-CM

## 2023-02-27 DIAGNOSIS — M810 Age-related osteoporosis without current pathological fracture: Secondary | ICD-10-CM

## 2023-02-27 DIAGNOSIS — R739 Hyperglycemia, unspecified: Secondary | ICD-10-CM

## 2023-02-27 DIAGNOSIS — I4819 Other persistent atrial fibrillation: Secondary | ICD-10-CM

## 2023-02-27 DIAGNOSIS — E782 Mixed hyperlipidemia: Secondary | ICD-10-CM

## 2023-02-27 DIAGNOSIS — Z0000 Encounter for general adult medical examination without abnormal findings: Secondary | ICD-10-CM

## 2023-02-27 DIAGNOSIS — R7989 Other specified abnormal findings of blood chemistry: Secondary | ICD-10-CM

## 2023-03-04 NOTE — Progress Notes (Unsigned)
Subjective:    Patient ID: Danielle Mcgee, female    DOB: 1930/06/12, 89 y.o.   MRN: [id number]      HPI Danielle Mcgee is here for a Physical exam and her chronic medical problems.   She urinated this morning and saw a little blood -  it was orange in color when she wiped.  Then wiped again and there was a dog of blood.  She is not sure where it came from.  No blood in urine.    Medications and allergies reviewed with patient and updated if appropriate.  Current Outpatient Medications on File Prior to Visit  Medication Sig Dispense Refill   Calcium-Vitamin D-Vitamin K (CALCIUM + D) [phone] MG-UNT-MCG CHEW Chew 1 tablet by mouth daily.     diltiazem (CARDIZEM CD) 120 MG 24 hr capsule TAKE 1 CAPSULE BY MOUTH EVERY DAY 90 capsule 1   ELIQUIS 2.5 MG TABS tablet TAKE 1 TABLET BY MOUTH TWICE A DAY 180 tablet 3   Multiple Vitamins-Minerals (CENTRUM SILVER) tablet Take 1 tablet by mouth daily.     No current facility-administered medications on file prior to visit.    Review of Systems  Constitutional:  Negative for fever.  HENT:  Positive for voice change (allergy related).   Eyes:  Negative for visual disturbance.  Respiratory:  Positive for cough (allergy related). Negative for shortness of breath and wheezing.   Cardiovascular:  Negative for chest pain, palpitations and leg swelling.  Gastrointestinal:  Negative for abdominal pain, blood in stool, constipation and diarrhea.       No gerd  Genitourinary:  Negative for dysuria and frequency.       Incontinence  - wears pad  Musculoskeletal:  Positive for arthralgias (mild). Negative for back pain.  Skin:  Negative for rash.  Neurological:  Negative for light-headedness and headaches.  Psychiatric/Behavioral:  Negative for dysphoric mood. The patient is not nervous/anxious.        Objective:   Vitals:   03/05/23 1407  BP: 128/80  Pulse: 70  Temp: 98.1 F (36.7 C)  SpO2: 96%   Filed Weights   03/05/23 1407   Weight: 102 lb (46.3 kg)   Body mass index is 19.27 kg/m.  BP Readings from Last 3 Encounters:  03/05/23 128/80  10/04/22 130/60  09/20/22 124/80    Wt Readings from Last 3 Encounters:  03/05/23 102 lb (46.3 kg)  10/04/22 102 lb (46.3 kg)  09/20/22 104 lb (47.2 kg)       Physical Exam Constitutional: She appears well-developed and well-nourished. No distress.  HENT:  Head: Normocephalic and atraumatic.  Right Ear: External ear normal. Normal ear canal and TM Left Ear: External ear normal.  Normal ear canal and TM Mouth/Throat: Oropharynx is clear and moist.  Eyes: Conjunctivae normal.  Neck: Neck supple. No tracheal deviation present. No thyromegaly present.  No carotid bruit  Cardiovascular: Normal rate, regular rhythm and normal heart sounds.   No murmur heard.  No edema. Pulmonary/Chest: Effort normal and breath sounds normal. No respiratory distress. She has no wheezes. She has no rales.  Breast: deferred   Abdominal: Soft. She exhibits no distension. There is no tenderness.  Lymphadenopathy: She has no cervical adenopathy.  Skin: Skin is warm and dry. She is not diaphoretic.  Psychiatric: She has a normal mood and affect. Her behavior is normal.     Lab Results  Component Value Date   WBC 5.3 09/14/2022   HGB 14.0 09/14/2022  HCT 41.9 09/14/2022   PLT 188.0 09/14/2022   GLUCOSE 90 09/14/2022   CHOL 220 (H) 09/14/2022   TRIG 73.0 09/14/2022   HDL 87.00 09/14/2022   LDLDIRECT 113.4 06/28/2011   LDLCALC 119 (H) 09/14/2022   ALT 21 09/14/2022   AST 27 09/14/2022   NA 137 09/14/2022   K 4.4 09/14/2022   CL 101 09/14/2022   CREATININE 0.69 09/14/2022   BUN 26 (H) 09/14/2022   CO2 29 09/14/2022   TSH 5.00 09/14/2022   HGBA1C 5.6 09/14/2022         Assessment & Plan:   Physical exam: Screening blood work  ordered Exercise  regular  Weight   normal Substance abuse  none   Reviewed recommended immunizations.   Health Maintenance  Topic  Date Due   Medicare Annual Wellness (AWV)  06/13/2019   DEXA SCAN  03/04/2024 (Originally 07/20/2021)   COVID-19 Vaccine (7 - 2023-24 season) 04/14/2023   INFLUENZA VACCINE  06/07/2023   DTaP/Tdap/Td (3 - Td or Tdap) 09/02/2025   Pneumonia Vaccine 42+ Years old  Completed   HPV VACCINES  Aged Out   Zoster Vaccines- Shingrix  Discontinued          See Problem List for Assessment and Plan of chronic medical problems.

## 2023-03-04 NOTE — Patient Instructions (Addendum)
    Blood work was ordered.   The lab is on the first floor.    Medications changes include :   none     Return in about 1 year (around 03/04/2024) for Physical Exam.   Health Maintenance, Female Adopting a healthy lifestyle and getting preventive care are important in promoting health and wellness. Ask your health care provider about: The right schedule for you to have regular tests and exams. Things you can do on your own to prevent diseases and keep yourself healthy. What should I know about diet, weight, and exercise? Eat a healthy diet  Eat a diet that includes plenty of vegetables, fruits, low-fat dairy products, and lean protein. Do not eat a lot of foods that are high in solid fats, added sugars, or sodium. Maintain a healthy weight Body mass index (BMI) is used to identify weight problems. It estimates body fat based on height and weight. Your health care provider can help determine your BMI and help you achieve or maintain a healthy weight. Get regular exercise Get regular exercise. This is one of the most important things you can do for your health. Most adults should: Exercise for at least 150 minutes each week. The exercise should increase your heart rate and make you sweat (moderate-intensity exercise). Do strengthening exercises at least twice a week. This is in addition to the moderate-intensity exercise. Spend less time sitting. Even light physical activity can be beneficial. Watch cholesterol and blood lipids Have your blood tested for lipids and cholesterol at 87 years of age, then have this test every 5 years. Have your cholesterol levels checked more often if: Your lipid or cholesterol levels are high. You are older than 87 years of age. You are at high risk for heart disease. What should I know about cancer screening? Depending on your health history and family history, you may need to have cancer screening at various ages. This may include screening  for: Breast cancer. Cervical cancer. Colorectal cancer. Skin cancer. Lung cancer. What should I know about heart disease, diabetes, and high blood pressure? Blood pressure and heart disease High blood pressure causes heart disease and increases the risk of stroke. This is more likely to develop in people who have high blood pressure readings or are overweight. Have your blood pressure checked: Every 3-5 years if you are 18-39 years of age. Every year if you are 40 years old or older. Diabetes Have regular diabetes screenings. This checks your fasting blood sugar level. Have the screening done: Once every three years after age 40 if you are at a normal weight and have a low risk for diabetes. More often and at a younger age if you are overweight or have a high risk for diabetes. What should I know about preventing infection? Hepatitis B If you have a higher risk for hepatitis B, you should be screened for this virus. Talk with your health care provider to find out if you are at risk for hepatitis B infection. Hepatitis C Testing is recommended for: Everyone born from 1945 through 1965. Anyone with known risk factors for hepatitis C. Sexually transmitted infections (STIs) Get screened for STIs, including gonorrhea and chlamydia, if: You are sexually active and are younger than 87 years of age. You are older than 87 years of age and your health care provider tells you that you are at risk for this type of infection. Your sexual activity has changed since you were last screened, and you are at   increased risk for chlamydia or gonorrhea. Ask your health care provider if you are at risk. Ask your health care provider about whether you are at high risk for HIV. Your health care provider may recommend a prescription medicine to help prevent HIV infection. If you choose to take medicine to prevent HIV, you should first get tested for HIV. You should then be tested every 3 months for as long as you  are taking the medicine. Pregnancy If you are about to stop having your period (premenopausal) and you may become pregnant, seek counseling before you get pregnant. Take 400 to 800 micrograms (mcg) of folic acid every day if you become pregnant. Ask for birth control (contraception) if you want to prevent pregnancy. Osteoporosis and menopause Osteoporosis is a disease in which the bones lose minerals and strength with aging. This can result in bone fractures. If you are 65 years old or older, or if you are at risk for osteoporosis and fractures, ask your health care provider if you should: Be screened for bone loss. Take a calcium or vitamin D supplement to lower your risk of fractures. Be given hormone replacement therapy (HRT) to treat symptoms of menopause. Follow these instructions at home: Alcohol use Do not drink alcohol if: Your health care provider tells you not to drink. You are pregnant, may be pregnant, or are planning to become pregnant. If you drink alcohol: Limit how much you have to: 0-1 drink a day. Know how much alcohol is in your drink. In the [place] one drink equals one 12 oz bottle of beer (355 mL), one 5 oz glass of wine (148 mL), or one 1 oz glass of hard liquor (44 mL). Lifestyle Do not use any products that contain nicotine or tobacco. These products include cigarettes, chewing tobacco, and vaping devices, such as e-cigarettes. If you need help quitting, ask your health care provider. Do not use street drugs. Do not share needles. Ask your health care provider for help if you need support or information about quitting drugs. General instructions Schedule regular health, dental, and eye exams. Stay current with your vaccines. Tell your health care provider if: You often feel depressed. You have ever been abused or do not feel safe at home. Summary Adopting a healthy lifestyle and getting preventive care are important in promoting health and wellness. Follow your  health care provider's instructions about healthy diet, exercising, and getting tested or screened for diseases. Follow your health care provider's instructions on monitoring your cholesterol and blood pressure. This information is not intended to replace advice given to you by your health care provider. Make sure you discuss any questions you have with your health care provider. Document Revised: 03/14/2021 Document Reviewed: 03/14/2021 Elsevier Patient Education  2023 [employer] Inc[employer]  

## 2023-03-05 ENCOUNTER — Encounter: Payer: Self-pay | Admitting: Internal Medicine

## 2023-03-05 ENCOUNTER — Ambulatory Visit: Payer: Medicare Other | Admitting: Internal Medicine

## 2023-03-05 VITALS — BP 128/80 | HR 70 | Temp 98.1°F | Ht 61.0 in | Wt 102.0 lb

## 2023-03-05 DIAGNOSIS — I4819 Other persistent atrial fibrillation: Secondary | ICD-10-CM

## 2023-03-05 DIAGNOSIS — Z0000 Encounter for general adult medical examination without abnormal findings: Secondary | ICD-10-CM | POA: Diagnosis not present

## 2023-03-05 DIAGNOSIS — M810 Age-related osteoporosis without current pathological fracture: Secondary | ICD-10-CM | POA: Diagnosis not present

## 2023-03-05 DIAGNOSIS — E782 Mixed hyperlipidemia: Secondary | ICD-10-CM | POA: Diagnosis not present

## 2023-03-05 DIAGNOSIS — R739 Hyperglycemia, unspecified: Secondary | ICD-10-CM | POA: Diagnosis not present

## 2023-03-05 DIAGNOSIS — R310 Gross hematuria: Secondary | ICD-10-CM

## 2023-03-05 LAB — CBC WITH DIFFERENTIAL/PLATELET
Basophils Absolute: 0 10*3/uL (ref 0.0–0.1)
Basophils Relative: 0.7 % (ref 0.0–3.0)
Eosinophils Absolute: 0 10*3/uL (ref 0.0–0.7)
Eosinophils Relative: 0.7 % (ref 0.0–5.0)
HCT: 43.2 % (ref 36.0–46.0)
Hemoglobin: 14.4 g/dL (ref 12.0–15.0)
Lymphocytes Relative: 23.6 % (ref 12.0–46.0)
Lymphs Abs: 1.1 10*3/uL (ref 0.7–4.0)
MCHC: 33.4 g/dL (ref 30.0–36.0)
MCV: 92.3 fl (ref 78.0–100.0)
Monocytes Absolute: 0.6 10*3/uL (ref 0.1–1.0)
Monocytes Relative: 13.3 % — ABNORMAL HIGH (ref 3.0–12.0)
Neutro Abs: 2.9 10*3/uL (ref 1.4–7.7)
Neutrophils Relative %: 61.7 % (ref 43.0–77.0)
Platelets: 196 10*3/uL (ref 150.0–400.0)
RBC: 4.68 Mil/uL (ref 3.87–5.11)
RDW: 13.9 % (ref 11.5–15.5)
WBC: 4.7 10*3/uL (ref 4.0–10.5)

## 2023-03-05 LAB — COMPREHENSIVE METABOLIC PANEL
ALT: 32 U/L (ref 0–35)
AST: 37 U/L (ref 0–37)
Albumin: 4.4 g/dL (ref 3.5–5.2)
Alkaline Phosphatase: 100 U/L (ref 39–117)
BUN: 33 mg/dL — ABNORMAL HIGH (ref 6–23)
CO2: 28 mEq/L (ref 19–32)
Calcium: 9.8 mg/dL (ref 8.4–10.5)
Chloride: 101 mEq/L (ref 96–112)
Creatinine, Ser: 0.73 mg/dL (ref 0.40–1.20)
GFR: 72.29 mL/min (ref >60.00)
Glucose, Bld: 113 mg/dL — ABNORMAL HIGH (ref 70–99)
Potassium: 4.7 mEq/L (ref 3.5–5.1)
Sodium: 137 mEq/L (ref 135–145)
Total Bilirubin: 0.5 mg/dL (ref 0.2–1.2)
Total Protein: 7.4 g/dL (ref 6.0–8.3)

## 2023-03-05 LAB — LIPID PANEL
Cholesterol: 208 mg/dL — ABNORMAL HIGH (ref 0–200)
HDL: 86.3 mg/dL (ref >39.00)
LDL Cholesterol: 109 mg/dL — ABNORMAL HIGH (ref 0–99)
NonHDL: 121.25
Total CHOL/HDL Ratio: 2
Triglycerides: 62 mg/dL (ref 0.0–149.0)
VLDL: 12.4 mg/dL (ref 0.0–40.0)

## 2023-03-05 LAB — VITAMIN D 25 HYDROXY (VIT D DEFICIENCY, FRACTURES): VITD: 63.81 ng/mL (ref 30.00–100.00)

## 2023-03-05 LAB — HEMOGLOBIN A1C: Hgb A1c MFr Bld: 5.5 % (ref 4.6–6.5)

## 2023-03-05 LAB — TSH: TSH: 4.1 u[IU]/mL (ref 0.35–5.50)

## 2023-03-05 NOTE — Assessment & Plan Note (Addendum)
Acute Episode of blood when she wiped-not sure where it came from-?  In urine versus related to vaginal atrophy Check UA, urine culture Looking back she did have a similar episode about 1 year ago She will monitor closely

## 2023-03-05 NOTE — Assessment & Plan Note (Signed)
Chronic Following with cardiology Asymptomatic In A-fib here today Diltiazem 120 mg daily, Eliquis 2.5 mg daily CBC, CMP, TSH, lipid panel 

## 2023-03-05 NOTE — Assessment & Plan Note (Signed)
Chronic Regular exercise and healthy diet encouraged Check lipid panel  Continue lifestyle control 

## 2023-03-05 NOTE — Assessment & Plan Note (Addendum)
Chronic Filled fosamax but never took it --- has decided she does not want to take it Continue calcium and vitamin d daily Deferred DEXA states she does not want to take medication-can reconsider next year

## 2023-03-05 NOTE — Assessment & Plan Note (Signed)
Chronic Check a1c Low sugar / carb diet Stressed regular exercise  

## 2023-03-06 LAB — URINALYSIS, ROUTINE W REFLEX MICROSCOPIC
Bilirubin Urine: NEGATIVE
Ketones, ur: NEGATIVE
Leukocytes,Ua: NEGATIVE
Nitrite: NEGATIVE
Specific Gravity, Urine: 1.025 (ref 1.000–1.030)
Total Protein, Urine: NEGATIVE
Urine Glucose: NEGATIVE
Urobilinogen, UA: 0.2 (ref 0.0–1.0)
pH: 5.5 (ref 5.0–8.0)

## 2023-03-06 LAB — URINE CULTURE

## 2023-03-08 ENCOUNTER — Telehealth: Payer: Self-pay | Admitting: Radiology

## 2023-03-08 NOTE — Telephone Encounter (Signed)
Contacted Noretta Frier to schedule their annual wellness visit. Patient declined to schedule AWV at this time.  Shilo Pauwels K. CMA

## 2023-03-20 DIAGNOSIS — H26491 Other secondary cataract, right eye: Secondary | ICD-10-CM | POA: Diagnosis not present

## 2023-03-20 DIAGNOSIS — Z961 Presence of intraocular lens: Secondary | ICD-10-CM | POA: Diagnosis not present

## 2023-04-17 ENCOUNTER — Ambulatory Visit (HOSPITAL_BASED_OUTPATIENT_CLINIC_OR_DEPARTMENT_OTHER): Payer: Medicare Other | Admitting: Cardiology

## 2023-04-17 ENCOUNTER — Encounter (HOSPITAL_BASED_OUTPATIENT_CLINIC_OR_DEPARTMENT_OTHER): Payer: Self-pay | Admitting: Cardiology

## 2023-04-17 VITALS — BP 130/72 | HR 77 | Ht 61.0 in | Wt 104.7 lb

## 2023-04-17 DIAGNOSIS — I4821 Permanent atrial fibrillation: Secondary | ICD-10-CM | POA: Diagnosis not present

## 2023-04-17 DIAGNOSIS — Z7189 Other specified counseling: Secondary | ICD-10-CM | POA: Diagnosis not present

## 2023-04-17 DIAGNOSIS — Z7901 Long term (current) use of anticoagulants: Secondary | ICD-10-CM | POA: Diagnosis not present

## 2023-04-17 DIAGNOSIS — D6869 Other thrombophilia: Secondary | ICD-10-CM | POA: Diagnosis not present

## 2023-04-17 NOTE — Progress Notes (Signed)
  Cardiology Office Note:  .   Date:  04/17/2023  ID:  Danielle Mcgee, DOB [birth year], MRN [id number] PCP: Pincus Sanes, MD  [facility] Providers Cardiologist:  Jodelle Red, MD {  History of Present Illness: .   Danielle Mcgee is a 89 y.o. female with a hx of permanent atrial fibrillation on apixaban who is seen for follow up today. I initially met her as a new consult on 01/18/21 at the request of Burns, Bobette Mo, MD for the evaluation and management of atrial fibrillation.   Pertinent history: Diagnosed with afib 01/2021. Denies symptoms. Attempted cardioversion, unable to convert. Is asymptomatic, opted for rate control.   Today: Got married recently, congratulated. Completely asymptomatic in her atrial fibrillation. Remains active, no limitations.   ROS: Denies chest pain, shortness of breath at rest or with normal exertion. No PND, orthopnea, LE edema or unexpected weight gain. No syncope or palpitations. ROS otherwise negative except as noted.   Studies Reviewed: Marland Kitchen    EKG:  atrial fibrillation at 77 bpm  Physical Exam:   VS:  BP 130/72 (BP Location: Left Arm, Patient Position: Sitting, Cuff Size: Normal)   Pulse 77   Ht 5\' 1"  (1.549 m)   Wt 104 lb 11.2 oz (47.5 kg)   BMI 19.78 kg/m    Wt Readings from Last 3 Encounters:  04/17/23 104 lb 11.2 oz (47.5 kg)  03/05/23 102 lb (46.3 kg)  10/04/22 102 lb (46.3 kg)    GEN: Well nourished, well developed in no acute distress HEENT: Normal, moist mucous membranes NECK: No JVD CARDIAC: irregularly irregular rhythm, normal S1 and S2, no rubs or gallops. No murmur. VASCULAR: Radial and DP pulses 2+ bilaterally. No carotid bruits RESPIRATORY:  Clear to auscultation without rales, wheezing or rhonchi  ABDOMEN: Soft, non-tender, non-distended MUSCULOSKELETAL:  Ambulates independently SKIN: Warm and dry, no edema NEUROLOGIC:  Alert and oriented x 3. No focal neuro deficits noted. PSYCHIATRIC:  Normal affect     ASSESSMENT AND PLAN: .   Atrial fibrillation, permanent -echo as above. Low normal EF, biatrial enlargement. -did not convert with cardioversion. She does not wish to attempt rhythm control, either with change in medications, reattempted cardioversion, or ablation. Thus, she is permanent atrial fibrillation -tolerating low dose long acting dilitazem.  -CHA2DS2/VAS Stroke Risk Points= 3 -tolerating apixaban, dose reduced for age and weight. No bleeding issues. -she will contact me if she develops symptoms/limitations.   Cardiac risk counseling and prevention recommendations: -recommend heart healthy/Mediterranean diet, with whole grains, fruits, vegetable, fish, lean meats, nuts, and olive oil. Limit salt. -recommend moderate walking, 3-5 times/week for 30-50 minutes each session. Aim for at least 150 minutes.week. Goal should be pace of 3 miles/hours, or walking 1.5 miles in 30 minutes -recommend avoidance of tobacco products. Avoid excess alcohol.   Plan for follow up: 1 year or sooner if needed  Signed, Jodelle Red, MD   Jodelle Red, MD, PhD, [facility]  [facility]  [place]  [facility] at [facility] at [facility] [address] [place], [place] [address] [phone]

## 2023-04-17 NOTE — Patient Instructions (Signed)
Medication Instructions:  Your physician recommends that you continue on your current medications as directed. Please refer to the Current Medication list given to you today.  *If you need a refill on your cardiac medications before your next appointment, please call your pharmacy*  Lab Work: NONE  Testing/Procedures: NONE  Follow-Up: At [facility], you and your health needs are our priority.  As part of our continuing mission to provide you with exceptional heart care, we have created designated Provider Care Teams.  These Care Teams include your primary Cardiologist (physician) and Advanced Practice Providers (APPs -  Physician Assistants and Nurse Practitioners) who all work together to provide you with the care you need, when you need it.  We recommend signing up for the patient portal called "MyChart".  Sign up information is provided on this After Visit Summary.  MyChart is used to connect with patients for Virtual Visits (Telemedicine).  Patients are able to view lab/test results, encounter notes, upcoming appointments, etc.  Non-urgent messages can be sent to your provider as well.   To learn more about what you can do with MyChart, go to [email or web address].    Your next appointment:   12 month(s)  The format for your next appointment:   In Person  Provider:   Bridgette Christopher, MD     

## 2023-05-23 DIAGNOSIS — L570 Actinic keratosis: Secondary | ICD-10-CM | POA: Diagnosis not present

## 2023-05-23 DIAGNOSIS — Z85828 Personal history of other malignant neoplasm of skin: Secondary | ICD-10-CM | POA: Diagnosis not present

## 2023-05-23 DIAGNOSIS — L820 Inflamed seborrheic keratosis: Secondary | ICD-10-CM | POA: Diagnosis not present

## 2023-06-29 ENCOUNTER — Other Ambulatory Visit: Payer: Self-pay | Admitting: Cardiology

## 2023-06-29 DIAGNOSIS — I4819 Other persistent atrial fibrillation: Secondary | ICD-10-CM

## 2023-06-29 NOTE — Telephone Encounter (Signed)
Patient was seen by Dr. Cristal Deer on 04/17/23

## 2023-06-29 NOTE — Telephone Encounter (Signed)
Rx request sent to pharmacy.  

## 2023-06-29 NOTE — Telephone Encounter (Signed)
Please call pt to schedule overdue 1 year follow-up appointment with Dr. Cristal Deer or APP for refills. Last OV 04/2022. Thank you!

## 2023-07-02 DIAGNOSIS — Z23 Encounter for immunization: Secondary | ICD-10-CM | POA: Diagnosis not present

## 2023-07-03 ENCOUNTER — Telehealth: Payer: Self-pay | Admitting: Cardiology

## 2023-07-03 DIAGNOSIS — I4821 Permanent atrial fibrillation: Secondary | ICD-10-CM

## 2023-07-03 MED ORDER — APIXABAN 2.5 MG PO TABS: 2.5000 mg | ORAL_TABLET | Freq: Two times a day (BID) | ORAL | 1 refills | Status: DC

## 2023-07-03 NOTE — Telephone Encounter (Signed)
Eliquis refill please °

## 2023-07-03 NOTE — Telephone Encounter (Signed)
Pt c/o medication issue:  1. Name of Medication:   ELIQUIS 2.5 MG TABS tablet    2. How are you currently taking this medication (dosage and times per day)? As prescribed   3. Are you having a reaction (difficulty breathing--STAT)?   4. What is your medication issue? Patient needs this medication sent to [facility] with 90 day fill.   [facility] - [place], [place] - [address] [place]  Call [phone]   Patient is requesting call back to verify information.

## 2023-07-03 NOTE — Telephone Encounter (Signed)
Prescription refill request for Eliquis received. Indication: Afib  Last office visit: 04/17/23 Cristal Deer)  Scr: 0.73 (03/05/23)  Age: 89 Weight: 47.5kg  Appropriate dose. Refill sent.

## 2023-07-06 DIAGNOSIS — Z23 Encounter for immunization: Secondary | ICD-10-CM | POA: Diagnosis not present

## 2023-07-12 ENCOUNTER — Telehealth: Payer: Self-pay | Admitting: Cardiology

## 2023-07-12 DIAGNOSIS — I4819 Other persistent atrial fibrillation: Secondary | ICD-10-CM

## 2023-07-12 NOTE — Telephone Encounter (Signed)
*  STAT* If patient is at the pharmacy, call can be transferred to refill team.   1. Which medications need to be refilled? (please list name of each medication and dose if known) apixaban (ELIQUIS) 2.5 MG TABS tablet  2. Which pharmacy/location (including street and city if local pharmacy) is medication to be sent to? [facility] [place], [place]  3. Do they need a 30 day or 90 day supply? [place]

## 2023-07-12 NOTE — Telephone Encounter (Signed)
Please review for refill. Thank you! 

## 2023-07-16 ENCOUNTER — Other Ambulatory Visit: Payer: Self-pay | Admitting: Internal Medicine

## 2023-07-16 DIAGNOSIS — Z1231 Encounter for screening mammogram for malignant neoplasm of breast: Secondary | ICD-10-CM

## 2023-08-01 MED ORDER — DILTIAZEM HCL ER COATED BEADS 120 MG PO CP24
120.0000 mg | ORAL_CAPSULE | Freq: Every day | ORAL | 3 refills | Status: DC
Start: 2023-08-01 — End: 2024-07-09

## 2023-08-01 NOTE — Telephone Encounter (Signed)
*  STAT* If patient is at the pharmacy, call can be transferred to refill team.   1. Which medications need to be refilled? (please list name of each medication and dose if known) new prescription for Diltiazem- changing pharmacy   2. Would you like to learn more about the convenience, safety, & potential cost savings by using the [facility]?    3. Are you open to using the [facility] (Type [facility].    4. Which pharmacy/location (including street and city if local pharmacy) is medication to be sent to?[facility]   5. Do they need a 30 day or 90 day supply? 90 days and refills- patient would like for the nurse to call her, once she called this in

## 2023-08-22 ENCOUNTER — Ambulatory Visit
Admission: RE | Admit: 2023-08-22 | Discharge: 2023-08-22 | Disposition: A | Discharge status: Home / Self Care | Payer: Medicare Other | Source: Ambulatory Visit | Attending: Internal Medicine | Admitting: Internal Medicine

## 2023-08-22 DIAGNOSIS — Z1231 Encounter for screening mammogram for malignant neoplasm of breast: Secondary | ICD-10-CM | POA: Diagnosis not present

## 2023-09-13 ENCOUNTER — Ambulatory Visit: Payer: Medicare Other

## 2023-09-13 VITALS — Ht 61.0 in | Wt 104.0 lb

## 2023-09-13 DIAGNOSIS — Z0000 Encounter for general adult medical examination without abnormal findings: Secondary | ICD-10-CM

## 2023-09-13 NOTE — Progress Notes (Addendum)
Subjective:   Danielle Mcgee is a 89 y.o. female who presents for Medicare Annual (Subsequent) preventive examination.  Visit Complete: Virtual I connected with  Danielle Mcgee on 09/17/23 by a audio enabled telemedicine application and verified that I am speaking with the correct person using two identifiers.  Patient Location: Home  Provider Location: Office/Clinic  I discussed the limitations of evaluation and management by telemedicine. The patient expressed understanding and agreed to proceed.  Vital Signs: Because this visit was a virtual/telehealth visit, some criteria may be missing or patient reported. Any vitals not documented were not able to be obtained and vitals that have been documented are patient reported.  Cardiac Risk Factors include: advanced age (>61men, >19 women);dyslipidemia;Other (see comment), Risk factor comments: Mild cardiomegaly, A-Fib     Objective:    Today's Vitals   09/13/23 1509  Weight: 104 lb (47.2 kg)  Height: 5\' 1"  (1.549 m)   Body mass index is 19.65 kg/m.     09/13/2023    3:25 PM 10/10/2022   11:06 AM 02/11/2021    7:59 AM 09/29/2015   11:16 AM 06/18/2012    3:34 PM 09/19/2011   10:35 AM  Advanced Directives  Does Patient Have a Medical Advance Directive? Yes Yes Yes Yes Patient has advance directive, copy not in chart Patient has advance directive, copy not in chart  Type of Advance Directive Healthcare Power of [place];Living will Healthcare Power of [place];Living will Healthcare Power of [place];Living will Healthcare Power of [place];Living will    Does patient want to make changes to medical advance directive?  No - Patient declined  No - Patient declined    Copy of Healthcare Power of Attorney in Chart? No - copy requested  No - copy requested No - copy requested      Current Medications (verified) Outpatient Encounter Medications as of 09/13/2023  Medication Sig   apixaban (ELIQUIS) 2.5 MG TABS tablet Take 1 tablet  (2.5 mg total) by mouth 2 (two) times daily.   Calcium-Vitamin D-Vitamin K (CALCIUM + D) [phone] MG-UNT-MCG CHEW Chew 1 tablet by mouth daily.   diltiazem (CARDIZEM CD) 120 MG 24 hr capsule Take 1 capsule (120 mg total) by mouth daily.   Multiple Vitamins-Minerals (CENTRUM SILVER) tablet Take 1 tablet by mouth daily.   No facility-administered encounter medications on file as of 09/13/2023.    Allergies (verified) Codeine   History: Past Medical History:  Diagnosis Date   Benign breast disease 2000   DJD (degenerative joint disease)    Generalized headaches    Heart murmur    Hyperlipidemia    Mitral click-murmur syndrome    nomitral  reguritation   Osteopenia    Temporal arteritis (HCC)    2013-14   Past Surgical History:  Procedure Laterality Date   APPENDECTOMY  1962   ARTERY BIOPSY  09/20/2011   Procedure: BIOPSY TEMPORAL ARTERY;  Surgeon: [name], MD;  Location: [facility] OR;  Service: General;  Laterality: Left;   BREAST BIOPSY  2000   Atypical Lobular Hyperplasia   CARDIOVERSION N/A 02/11/2021   Procedure: CARDIOVERSION;  Surgeon: Jodelle Red, MD;  Location: [facility] ENDOSCOPY;  Service: Cardiovascular;  Laterality: N/A;   CATARACT EXTRACTION     bilaterally   COLONOSCOPY  2002 & 2013   Dr Juanda Chance   ELBOW SURGERY  2009   lt fx   ENDOSCOPIC TURBINATE REDUCTION Bilateral 10/06/2015   Procedure: ENDOSCOPIC TURBINATE REDUCTION;  Surgeon: Osborn Coho, MD;  Location: [facility]  OR;  Service: ENT;  Laterality: Bilateral;   EYE SURGERY     FRACTURE SURGERY Bilateral    wrist   JOINT REPLACEMENT  2001   both-knees   SINUS ENDO W/FUSION Left 10/06/2015   Procedure: LEFT ENDOSCOPIC SINUS SURGERY WITH MAXILLARY ANTROSTOMY AND REMOVAL OF DISEASED TISSUE ;  Surgeon: Osborn Coho, MD;  Location: [facility] OR;  Service: ENT;  Laterality: Left;   TOTAL KNEE ARTHROPLASTY     bilaterally   Family History  Problem Relation Age of Onset   Breast cancer Mother    Heart attack  Mother        early 1s   Osteoporosis Mother    Diabetes Mother    Heart attack Father 54   Heart disease Sister        CABG in late 25s   Cancer Sister        CERVICAL   Diabetes Sister    Diabetes Maternal Aunt        X2   Diabetes Maternal Grandmother    Stroke Neg Hx    Social History   Socioeconomic History   Marital status: Married    Spouse name: Danielle Mcgee   Number of children: 4   Years of education: [employer] education level: Not on file  Occupational History   Occupation: Retired  Tobacco Use   Smoking status: Never   Smokeless tobacco: Never  Vaping Use   Vaping status: Never Used  Substance and Sexual Activity   Alcohol use: Yes    Alcohol/week: 7.0 standard drinks of alcohol    Types: 7 Glasses of wine per week    Comment: Wine at dinner   Drug use: No   Sexual activity: Not on file  Other Topics Concern   Not on file  Social History Narrative   Lives with husband   Right-handed   Caffeine: 1-3 Coke/tea   Exercise: walking regularly   Social Determinants of Health   Financial Resource Strain: Low Risk  (09/13/2023)   Overall Financial Resource Strain (CARDIA)    Difficulty of Paying Living Expenses: Not hard at all  Food Insecurity: No Food Insecurity (09/13/2023)   Hunger Vital Sign    Worried About Running Out of Food in the Last Year: Never true    Ran Out of Food in the Last Year: Never true  Transportation Needs: No Transportation Needs (09/13/2023)   PRAPARE - Administrator, Civil Service (Medical): No    Lack of Transportation (Non-Medical): No  Physical Activity: Inactive (09/13/2023)   Exercise Vital Sign    Days of Exercise per Week: 0 days    Minutes of Exercise per Session: 0 min  Stress: No Stress Concern Present (09/13/2023)   [employer] of Occupational Health - Occupational Stress Questionnaire    Feeling of Stress : Not at all  Social Connections: Socially Integrated (09/13/2023)   Social Connection  and Isolation Panel [NHANES]    Frequency of Communication with Friends and Family: More than three times a week    Frequency of Social Gatherings with Friends and Family: More than three times a week    Attends Religious Services: More than 4 times per year    Active Member of [employer] or Organizations: Yes    Attends Engineer, structural: More than 4 times per year    Marital Status: Married    Tobacco Counseling Counseling given: Not Answered   Clinical Intake:  Pre-visit preparation completed: Yes  Pain : No/denies pain     BMI - recorded: 19.65 Nutritional Status: BMI of 19-24  Normal Nutritional Risks: None Diabetes: No  How often do you need to have someone help you when you read instructions, pamphlets, or other written materials from your doctor or pharmacy?: 1 - Never  Interpreter Needed?: No  Information entered by :: Takota Cahalan, RMA   Activities of Daily Living    09/13/2023    3:22 PM  In your present state of health, do you have any difficulty performing the following activities:  Hearing? 0  Vision? 0  Difficulty concentrating or making decisions? 0  Walking or climbing stairs? 0  Dressing or bathing? 0  Doing errands, shopping? 0  Preparing Food and eating ? N  Using the Toilet? N  In the past six months, have you accidently leaked urine? N  Do you have problems with loss of bowel control? N  Managing your Medications? N  Managing your Finances? N  Housekeeping or managing your Housekeeping? N    Patient Care Team: Pincus Sanes, MD as PCP - General (Internal Medicine) Jodelle Red, MD as PCP - Cardiology (Cardiology)  Indicate any recent Medical Services you may have received from other than [facility] providers in the past year (date may be approximate).     Assessment:   This is a routine wellness examination for Velicia Walker.  Hearing/Vision screen Hearing Screening - Comments:: Denies hearing difficulties   Vision  Screening - Comments:: Wears eyeglasses   Goals Addressed               This Visit's Progress     Patient Stated (pt-stated)        Stay active      Depression Screen    09/13/2023    3:29 PM 03/05/2023    2:13 PM 09/14/2022   11:09 AM 02/08/2022   10:05 AM 01/12/2021    1:30 PM 07/28/2019   12:58 PM 06/16/2019    1:27 PM  PHQ 2/9 Scores  PHQ - 2 Score 0 0 0 0 0 0 0  PHQ- 9 Score 0 0         Fall Risk    09/13/2023    3:25 PM 03/05/2023    2:13 PM 10/04/2022    8:17 AM 09/14/2022   11:09 AM 02/08/2022   10:05 AM  Fall Risk   Falls in the past year? 0 0 0 0 0  Number falls in past yr: 0 0 0 0 0  Injury with Fall? 0 0 1 0 0  Risk for fall due to : No Fall Risks No Fall Risks No Fall Risks No Fall Risks No Fall Risks  Follow up Falls prevention discussed;Falls evaluation completed Falls evaluation completed Falls evaluation completed Falls evaluation completed Falls evaluation completed    MEDICARE RISK AT HOME: Medicare Risk at Home Any stairs in or around the home?: Yes If so, are there any without handrails?: Yes Home free of loose throw rugs in walkways, pet beds, electrical cords, etc?: Yes Adequate lighting in your home to reduce risk of falls?: Yes Life alert?: No Use of a cane, walker or w/c?: No Grab bars in the bathroom?: No Shower chair or bench in shower?: No Elevated toilet seat or a handicapped toilet?: Yes  TIMED UP AND GO:  Was the test performed?  No    Cognitive Function:        09/13/2023    4:16 PM  6CIT  Screen  What Year? 0 points  What month? 0 points  What time? 0 points  Count back from 20 0 points  Months in reverse 0 points  Repeat phrase 0 points  Total Score 0 points    Immunizations Immunization History  Administered Date(s) Administered   Fluad Quad(high Dose 65+) 07/21/2022   Influenza Split 08/19/2012   Influenza, High Dose Seasonal PF 08/24/2014, 09/09/2016, 07/02/2018, 06/16/2019, 07/02/2023   Influenza,inj,Quad PF,6+  Mos 08/13/2015   PFIZER Comirnaty(Gray Top)Covid-19 Tri-Sucrose Vaccine 02/03/2021   PFIZER(Purple Top)SARS-COV-2 Vaccination 11/26/2019, 12/17/2019   Pfizer Covid-19 Vaccine Bivalent Booster 5yrs & up 03/02/2022   Pfizer(Comirnaty)Fall Seasonal Vaccine 12 years and older 07/06/2023   Pneumococcal Conjugate-13 07/08/2015   Pneumococcal Polysaccharide-23 09/14/2006   Respiratory Syncytial Virus Vaccine,Recomb Aduvanted(Arexvy) 07/21/2022   Td 11/07/2003   Tdap 09/03/2015   Unspecified SARS-COV-2 Vaccination 08/17/2022   Zoster Recombinant(Shingrix) 06/16/2019   Zoster, Live 10/16/2011    TDAP status: Up to date  Flu Vaccine status: Up to date  Pneumococcal vaccine status: Up to date  Covid-19 vaccine status: Completed vaccines  Qualifies for Shingles Vaccine? Yes   Zostavax completed Yes   Shingrix Completed?: Yes  Screening Tests Health Maintenance  Topic Date Due   COVID-19 Vaccine (7 - 2023-24 season) 12/07/2023 (Originally 08/31/2023)   DEXA SCAN  03/04/2024 (Originally 07/20/2021)   Medicare Annual Wellness (AWV)  09/12/2024   DTaP/Tdap/Td (3 - Td or Tdap) 09/02/2025   Pneumonia Vaccine 80+ Years old  Completed   INFLUENZA VACCINE  Completed   HPV VACCINES  Aged Out   Zoster Vaccines- Shingrix  Discontinued    Health Maintenance  There are no preventive care reminders to display for this patient.   Colorectal cancer screening: No longer required.   Mammogram status: Completed 08/22/2023. Repeat every year  Bone Density status: Completed 07/21/2019. Results reflect: Bone density results: OSTEOPOROSIS. Repeat every 5 years.  Lung Cancer Screening: (Low Dose CT Chest recommended if Age 40-80 years, 20 pack-year currently smoking OR have quit w/in 15years.) does not qualify.   Lung Cancer Screening Referral: N/A  Additional Screening:  Hepatitis C Screening: does not qualify;   Vision Screening: Recommended annual ophthalmology exams for early detection of  glaucoma and other disorders of the eye. Is the patient up to date with their annual eye exam?  Yes  Who is the provider or what is the name of the office in which the patient attends annual eye exams? Dr. Emily Filbert If pt is not established with a provider, would they like to be referred to a provider to establish care? No .   Dental Screening: Recommended annual dental exams for proper oral hygiene   Community Resource Referral / Chronic Care Management: CRR required this visit?  No   CCM required this visit?  No     Plan:     I have personally reviewed and noted the following in the patient's chart:   Medical and social history Use of alcohol, tobacco or illicit drugs  Current medications and supplements including opioid prescriptions. Patient is not currently taking opioid prescriptions. Functional ability and status Nutritional status Physical activity Advanced directives List of other physicians Hospitalizations, surgeries, and ER visits in previous 12 months Vitals Screenings to include cognitive, depression, and falls Referrals and appointments  In addition, I have reviewed and discussed with patient certain preventive protocols, quality metrics, and best practice recommendations. A written personalized care plan for preventive services as well as general preventive health recommendations were provided  to patient.     Darwyn Ponzo L Aiva Miskell, CMA   09/13/2023   After Visit Summary: (MyChart) Due to this being a telephonic visit, the after visit summary with patients personalized plan was offered to patient via MyChart   Nurse Notes: Patient is up to date with all her health maintenance.  She had no concerns to address today.

## 2023-09-13 NOTE — Patient Instructions (Addendum)
Danielle Mcgee , Thank you for taking time to come for your Medicare Wellness Visit. I appreciate your ongoing commitment to your health goals. Please review the following plan we discussed and let me know if I can assist you in the future.   Referrals/Orders/Follow-Ups/Clinician Recommendations: Keep up the good work.  It was nice talking with you today.  This is a list of the screening recommended for you and due dates:  Health Maintenance  Topic Date Due   COVID-19 Vaccine (7 - 2023-24 season) 12/07/2023*   DEXA scan (bone density measurement)  03/04/2024*   Medicare Annual Wellness Visit  09/12/2024   DTaP/Tdap/Td vaccine (3 - Td or Tdap) 09/02/2025   Pneumonia Vaccine  Completed   Flu Shot  Completed   HPV Vaccine  Aged Out   Zoster (Shingles) Vaccine  Discontinued  *Topic was postponed. The date shown is not the original due date.    Advanced directives: (Copy Requested) Please bring a copy of your health care power of attorney and living will to the office to be added to your chart at your convenience.  Next Medicare Annual Wellness Visit scheduled for next year: Yes

## 2023-09-16 NOTE — Patient Instructions (Addendum)
      Blood work was ordered.   The lab is on the first floor.    Medications changes include :       A referral was ordered and someone will call you to schedule an appointment.     Return in about 1 year (around 09/16/2024) for follow up.

## 2023-09-16 NOTE — Progress Notes (Signed)
      Subjective:    Patient ID: Danielle Mcgee, female    DOB: 29-Jul-1930, 89 y.o.   MRN: [id number]     HPI Danielle Mcgee is here for follow up of her chronic medical problems.    Medications and allergies reviewed with patient and updated if appropriate.  Current Outpatient Medications on File Prior to Visit  Medication Sig Dispense Refill  . apixaban (ELIQUIS) 2.5 MG TABS tablet Take 1 tablet (2.5 mg total) by mouth 2 (two) times daily. 180 tablet 1  . Calcium-Vitamin D-Vitamin K (CALCIUM + D) [id number] MG-UNT-MCG CHEW Chew 1 tablet by mouth daily.    Marland Kitchen diltiazem (CARDIZEM CD) 120 MG 24 hr capsule Take 1 capsule (120 mg total) by mouth daily. 90 capsule 3  . Multiple Vitamins-Minerals (CENTRUM SILVER) tablet Take 1 tablet by mouth daily.     No current facility-administered medications on file prior to visit.     Review of Systems     Objective:  There were no vitals filed for this visit. BP Readings from Last 3 Encounters:  04/17/23 130/72  03/05/23 128/80  10/04/22 130/60   Wt Readings from Last 3 Encounters:  09/13/23 104 lb (47.2 kg)  04/17/23 104 lb 11.2 oz (47.5 kg)  03/05/23 102 lb (46.3 kg)   There is no height or weight on file to calculate BMI.    Physical Exam     Lab Results  Component Value Date   WBC 4.7 03/05/2023   HGB 14.4 03/05/2023   HCT 43.2 03/05/2023   PLT 196.0 03/05/2023   GLUCOSE 113 (H) 03/05/2023   CHOL 208 (H) 03/05/2023   TRIG 62.0 03/05/2023   HDL 86.30 03/05/2023   LDLDIRECT 113.4 06/28/2011   LDLCALC 109 (H) 03/05/2023   ALT 32 03/05/2023   AST 37 03/05/2023   NA 137 03/05/2023   K 4.7 03/05/2023   CL 101 03/05/2023   CREATININE 0.73 03/05/2023   BUN 33 (H) 03/05/2023   CO2 28 03/05/2023   TSH 4.10 03/05/2023   HGBA1C 5.5 03/05/2023     Assessment & Plan:    See Problem List for Assessment and Plan of chronic medical problems.    This encounter was created in error - please disregard.

## 2023-09-17 ENCOUNTER — Encounter: Payer: Medicare Other | Admitting: Internal Medicine

## 2023-09-17 DIAGNOSIS — R739 Hyperglycemia, unspecified: Secondary | ICD-10-CM

## 2023-09-17 DIAGNOSIS — I4819 Other persistent atrial fibrillation: Secondary | ICD-10-CM

## 2023-09-17 DIAGNOSIS — M810 Age-related osteoporosis without current pathological fracture: Secondary | ICD-10-CM

## 2023-09-17 DIAGNOSIS — E782 Mixed hyperlipidemia: Secondary | ICD-10-CM

## 2023-09-17 NOTE — Assessment & Plan Note (Signed)
Chronic Following with cardiology Asymptomatic In A-fib here today Diltiazem 120 mg daily, Eliquis 2.5 mg daily CBC, CMP, lipid panel

## 2023-09-17 NOTE — Assessment & Plan Note (Signed)
Chronic Filled fosamax but never took it --- has decided she does not want to take it Continue calcium and vitamin d daily Deferred DEXA states she does not want to take medication-can reconsider next year

## 2023-09-17 NOTE — Assessment & Plan Note (Signed)
Chronic Check a1c Low sugar / carb diet Stressed regular exercise  

## 2023-09-17 NOTE — Assessment & Plan Note (Signed)
Chronic Regular exercise and healthy diet encouraged Lipids have been well-controlled Continue lifestyle control

## 2023-09-21 ENCOUNTER — Telehealth: Payer: Self-pay | Admitting: Internal Medicine

## 2023-09-21 ENCOUNTER — Ambulatory Visit: Payer: Medicare Other | Admitting: Internal Medicine

## 2023-09-21 NOTE — Telephone Encounter (Signed)
Danielle Mcgee came in today with a billing issue. On 4.29.24 the patient came in for her annual check up, but it was coded as a physical. Medicare will not pay on this statement until it is re-coded as an OV annual check up.   Please let me know if there is anything I can do to help with this correction.

## 2023-12-28 ENCOUNTER — Other Ambulatory Visit: Payer: Self-pay | Admitting: Cardiology

## 2023-12-28 DIAGNOSIS — I4821 Permanent atrial fibrillation: Secondary | ICD-10-CM

## 2023-12-28 NOTE — Telephone Encounter (Signed)
Prescription refill request for Eliquis received. Indication:afib Last office visit:needs visit Scr:0.73  4/24 Age: 89 Weight:47.2  kg  Prescription refilled

## 2024-01-16 ENCOUNTER — Encounter: Payer: Self-pay | Admitting: Internal Medicine

## 2024-01-16 ENCOUNTER — Ambulatory Visit: Admitting: Internal Medicine

## 2024-01-16 VITALS — BP 124/70 | HR 60 | Temp 98.0°F | Ht 61.0 in | Wt 104.6 lb

## 2024-01-16 DIAGNOSIS — J0100 Acute maxillary sinusitis, unspecified: Secondary | ICD-10-CM | POA: Diagnosis not present

## 2024-01-16 MED ORDER — AMOXICILLIN-POT CLAVULANATE 875-125 MG PO TABS: 1.0000 | ORAL_TABLET | Freq: Two times a day (BID) | ORAL | 0 refills | Status: AC

## 2024-01-16 NOTE — Assessment & Plan Note (Addendum)
 Acute Likely bacterial Does not have seasonal allergies and the symptoms are different from anything she has had before-concerning for sinus infection Start Augmentin 875-125 mg BID x 7 day otc cold medications Rest, fluid Call if no improvement

## 2024-01-16 NOTE — Progress Notes (Signed)
 Subjective:    Patient ID: Danielle Mcgee, female    DOB: Jul 11, 1930, 89 y.o.   MRN: [id number]      HPI Deena Shaub is here for  Chief Complaint  Patient presents with   Allergies    Christmas time had cold - cough, congestion -some symptoms improved, but others have been persistent.  She has continued to experience redness in her eyes, itchy eyes, eyes tear, sneezing, nasal congestion, runny nose, PND, throat is scratchy, hoarseness and some sinus pressure- symptoms more on left side of nose/face.  She denies seasonal allergies.  She is concerned she may have an infection.  Has tried benadryl - helps a little  - but dries her up.  She is concerned because her symptoms have been lasting so long.   Medications and allergies reviewed with patient and updated if appropriate.  Current Outpatient Medications on File Prior to Visit  Medication Sig Dispense Refill   apixaban (ELIQUIS) 2.5 MG TABS tablet Take 1 tablet (2.5 mg total) by mouth 2 (two) times daily. Needs cardiology visit, please call office.  Thank you 60 tablet 0   Calcium-Vitamin D-Vitamin K (CALCIUM + D) [phone] MG-UNT-MCG CHEW Chew 1 tablet by mouth daily.     diltiazem (CARDIZEM CD) 120 MG 24 hr capsule Take 1 capsule (120 mg total) by mouth daily. 90 capsule 3   Multiple Vitamins-Minerals (CENTRUM SILVER) tablet Take 1 tablet by mouth daily.     No current facility-administered medications on file prior to visit.    Review of Systems  Constitutional:  Negative for fever.  HENT:  Positive for congestion, rhinorrhea, sinus pressure, sneezing and voice change. Negative for ear pain (clogged).   Eyes:  Positive for discharge and itching.  Respiratory:  Negative for cough and wheezing.   Neurological:  Negative for dizziness and headaches.       Objective:   Vitals:   01/16/24 1434  BP: 124/70  Pulse: 60  Temp: 98 F (36.7 C)  SpO2: 97%   BP Readings from Last 3 Encounters:  01/16/24 124/70   04/17/23 130/72  03/05/23 128/80   Wt Readings from Last 3 Encounters:  01/16/24 104 lb 9.6 oz (47.4 kg)  09/13/23 104 lb (47.2 kg)  04/17/23 104 lb 11.2 oz (47.5 kg)   Body mass index is 19.76 kg/m.    Physical Exam Constitutional:      General: She is not in acute distress.    Appearance: Normal appearance. She is not ill-appearing.  HENT:     Head: Normocephalic and atraumatic.     Right Ear: Tympanic membrane, ear canal and external ear normal.     Left Ear: Tympanic membrane, ear canal and external ear normal.     Mouth/Throat:     Mouth: Mucous membranes are moist.     Pharynx: No oropharyngeal exudate or posterior oropharyngeal erythema.  Eyes:     Conjunctiva/sclera: Conjunctivae normal.  Cardiovascular:     Rate and Rhythm: Normal rate and regular rhythm.  Pulmonary:     Effort: Pulmonary effort is normal. No respiratory distress.     Breath sounds: Normal breath sounds. No wheezing or rales.  Musculoskeletal:     Cervical back: Neck supple. No tenderness.  Lymphadenopathy:     Cervical: No cervical adenopathy.  Skin:    General: Skin is warm and dry.  Neurological:     Mental Status: She is alert.  Assessment & Plan:    See Problem List for Assessment and Plan of chronic medical problems.

## 2024-01-16 NOTE — Patient Instructions (Addendum)
       Medications changes include :   Augmentin twice daily for 7 days   You can try taking claritin and using a nasal saline spray   Return if symptoms worsen or fail to improve.

## 2024-01-23 ENCOUNTER — Other Ambulatory Visit: Payer: Self-pay | Admitting: Cardiology

## 2024-01-23 DIAGNOSIS — I4821 Permanent atrial fibrillation: Secondary | ICD-10-CM

## 2024-01-23 NOTE — Telephone Encounter (Signed)
 Prescription refill request for Eliquis received. Indication: Afib  Last office visit: 04/17/23 Cristal Deer)  Scr: 0.73 (03/05/23)  Age: 89 Weight: 47.4kg  Appropriate dose. Refill sent.

## 2024-02-29 DIAGNOSIS — Z23 Encounter for immunization: Secondary | ICD-10-CM | POA: Diagnosis not present

## 2024-03-05 NOTE — Progress Notes (Signed)
 Subjective:    Patient ID: Danielle Mcgee, female    DOB: 1930/04/30, 89 y.o.   MRN: [id number]      HPI Khamryn Overfelt is here for  Chief Complaint  Patient presents with   Cough    Cough x 4-5 days and productive cough; sore throat; husband tested positive for pneumonia; COVID test yesterday: Negative    She is here for an acute visit for cold symptoms.  Her husband just had PNA.   Her symptoms started 4-5 days ago  She is experiencing Decreased appetite, nasal congestion, PND, sinus pressure, sore throat, cough that is productive and some dizziness.  She denies any fevers, shortness of breath or wheezing.  She has tried taking benadryl, mucinex cough syrup   Home covid test yesterday negative   Medications and allergies reviewed with patient and updated if appropriate.  Current Outpatient Medications on File Prior to Visit  Medication Sig Dispense Refill   apixaban  (ELIQUIS ) 2.5 MG TABS tablet Take 1 tablet (2.5 mg total) by mouth 2 (two) times daily. 180 tablet 1   Calcium -Vitamin D -Vitamin K (CALCIUM  + D) [phone] MG-UNT-MCG CHEW Chew 1 tablet by mouth daily.     diltiazem  (CARDIZEM  CD) 120 MG 24 hr capsule Take 1 capsule (120 mg total) by mouth daily. 90 capsule 3   Multiple Vitamins-Minerals (CENTRUM SILVER ) tablet Take 1 tablet by mouth daily.     No current facility-administered medications on file prior to visit.    Review of Systems  Constitutional:  Positive for appetite change (dec) and fatigue. Negative for fever.  HENT:  Positive for congestion, postnasal drip, sinus pressure (yesterday) and sore throat. Negative for ear pain (ears feel clogged).   Respiratory:  Positive for cough (productive). Negative for shortness of breath and wheezing.   Neurological:  Positive for dizziness (if she bends over). Negative for headaches.       Objective:   Vitals:   03/06/24 1111  Pulse: 83  Temp: 97.9 F (36.6 C)  SpO2: 94%   BP Readings from Last 3  Encounters:  01/16/24 124/70  04/17/23 130/72  03/05/23 128/80   Wt Readings from Last 3 Encounters:  03/06/24 102 lb (46.3 kg)  01/16/24 104 lb 9.6 oz (47.4 kg)  09/13/23 104 lb (47.2 kg)   Body mass index is 19.27 kg/m.    Physical Exam Constitutional:      General: She is not in acute distress.    Appearance: Normal appearance. She is not ill-appearing.  HENT:     Head: Normocephalic and atraumatic.     Right Ear: Tympanic membrane, ear canal and external ear normal.     Left Ear: Tympanic membrane, ear canal and external ear normal.     Mouth/Throat:     Mouth: Mucous membranes are moist.     Pharynx: No oropharyngeal exudate or posterior oropharyngeal erythema.  Eyes:     Conjunctiva/sclera: Conjunctivae normal.  Cardiovascular:     Rate and Rhythm: Normal rate and regular rhythm.  Pulmonary:     Effort: Pulmonary effort is normal. No respiratory distress.     Breath sounds: Normal breath sounds. No wheezing or rales.  Musculoskeletal:     Cervical back: Neck supple. No tenderness.  Lymphadenopathy:     Cervical: No cervical adenopathy.  Skin:    General: Skin is warm and dry.  Neurological:     Mental Status: She is alert.  Assessment & Plan:    See Problem List for Assessment and Plan of chronic medical problems.

## 2024-03-05 NOTE — Patient Instructions (Addendum)
    Have a chest xray downstairs    Medications changes include :   Augmentin       Return if symptoms worsen or fail to improve.

## 2024-03-06 ENCOUNTER — Ambulatory Visit

## 2024-03-06 ENCOUNTER — Encounter: Payer: Self-pay | Admitting: Internal Medicine

## 2024-03-06 ENCOUNTER — Ambulatory Visit (INDEPENDENT_AMBULATORY_CARE_PROVIDER_SITE_OTHER): Admitting: Internal Medicine

## 2024-03-06 ENCOUNTER — Ambulatory Visit: Payer: Medicare Other | Admitting: Internal Medicine

## 2024-03-06 VITALS — BP 136/72 | HR 83 | Temp 97.9°F | Ht 61.0 in | Wt 102.0 lb

## 2024-03-06 DIAGNOSIS — J209 Acute bronchitis, unspecified: Secondary | ICD-10-CM

## 2024-03-06 DIAGNOSIS — I517 Cardiomegaly: Secondary | ICD-10-CM | POA: Diagnosis not present

## 2024-03-06 DIAGNOSIS — R059 Cough, unspecified: Secondary | ICD-10-CM | POA: Diagnosis not present

## 2024-03-06 MED ORDER — AMOXICILLIN-POT CLAVULANATE 875-125 MG PO TABS: 1.0000 | ORAL_TABLET | Freq: Two times a day (BID) | ORAL | 0 refills | Status: AC

## 2024-03-06 NOTE — Assessment & Plan Note (Signed)
 Acute Likely bacterial Her husband he was just getting over his sickness and he did have pneumonia Chest x-ray today to rule out pneumonia Start Augmentin  875-125 mg BID x 10 day otc cold medications Rest, fluid Call if no improvement

## 2024-03-07 ENCOUNTER — Ambulatory Visit: Admitting: Internal Medicine

## 2024-03-25 DIAGNOSIS — H26492 Other secondary cataract, left eye: Secondary | ICD-10-CM | POA: Diagnosis not present

## 2024-03-25 DIAGNOSIS — H5203 Hypermetropia, bilateral: Secondary | ICD-10-CM | POA: Diagnosis not present

## 2024-03-25 DIAGNOSIS — H43813 Vitreous degeneration, bilateral: Secondary | ICD-10-CM | POA: Diagnosis not present

## 2024-05-12 ENCOUNTER — Telehealth: Payer: Self-pay

## 2024-05-12 DIAGNOSIS — J310 Chronic rhinitis: Secondary | ICD-10-CM

## 2024-05-12 NOTE — Telephone Encounter (Signed)
 Copied from CRM [id number]. Topic: Referral - Request for Referral >> May 12, 2024 11:00 AM Shereese L wrote: Did the patient discuss referral with their provider in the last year? Yes (If Yes - send message)  Appointment offered? No  Type of order/referral and detailed reason for visit: chronic drain and drip problem   Preference of office, provider, location:  [facility] Frutoso Luz MD [address], [place], [place] [address] [phone]  If referral order, have you been seen by this specialty before? No (If Yes, this issue or another issue? When? Where?  Can we respond through MyChart? No Please call patient

## 2024-05-16 ENCOUNTER — Ambulatory Visit (HOSPITAL_BASED_OUTPATIENT_CLINIC_OR_DEPARTMENT_OTHER): Admitting: Family

## 2024-05-16 ENCOUNTER — Encounter (HOSPITAL_BASED_OUTPATIENT_CLINIC_OR_DEPARTMENT_OTHER): Payer: Self-pay | Admitting: Family

## 2024-05-16 ENCOUNTER — Other Ambulatory Visit: Payer: Self-pay

## 2024-05-16 VITALS — BP 103/70 | HR 83 | Ht 61.0 in | Wt 105.0 lb

## 2024-05-16 DIAGNOSIS — D6859 Other primary thrombophilia: Secondary | ICD-10-CM | POA: Diagnosis not present

## 2024-05-16 DIAGNOSIS — I4819 Other persistent atrial fibrillation: Secondary | ICD-10-CM

## 2024-05-16 DIAGNOSIS — J3489 Other specified disorders of nose and nasal sinuses: Secondary | ICD-10-CM

## 2024-05-16 DIAGNOSIS — J320 Chronic maxillary sinusitis: Secondary | ICD-10-CM

## 2024-05-16 NOTE — Progress Notes (Signed)
 Cardiology Office Note   Date:  05/16/2024  ID:  Danielle Mcgee, DOB 01/24/1930, MRN [id number] PCP: Geofm Glade PARAS, MD  [facility] Providers Cardiologist:  Shelda Bruckner, MD     History of Present Illness Danielle Mcgee is a 89 y.o. female with hx of permanent atrial fibrillation on Apixaban .   Diagnosed with atrial fibrillation 01/2021. Cardioversion was attempted, but unsuccessful. She elected for rate control.   Presents today for follow up independently. Enjoys reading nonfiction in her spare time and spending time with her husband. She remains very active. Reports no shortness of breath nor dyspnea on exertion. Reports no chest pain, pressure, or tightness. No edema, orthopnea, PND. Reports no palpitations.  Unaware of atrial fibrillation. No bleeding complications on Eliquis .   ROS: Please see the history of present illness.    All other systems reviewed and are negative.   Studies Reviewed EKG Interpretation Date/Time:  Friday May 16 2024 15:30:40 EDT Ventricular Rate:  83 PR Interval:    QRS Duration:  132 QT Interval:  420 QTC Calculation: 493 R Axis:   90  Text Interpretation: Atrial fibrillation Right bundle branch block Confirmed by Vannie Mora ([id number]) on 05/16/2024 3:54:59 PM    Cardiac Studies & Procedures   ______________________________________________________________________________________________     ECHOCARDIOGRAM  ECHOCARDIOGRAM COMPLETE 01/19/2021  Narrative ECHOCARDIOGRAM REPORT    Patient Name:   Danielle Mcgee Date of Exam: 01/19/2021 Medical Rec #:  [id number]        Height:       61.0 in Accession #:    [id number]       Weight:       103.0 lb Date of Birth:  [birth year]        BSA:          1.425 m Patient Age:    89 years         BP:           120/76 mmHg Patient Gender: F                HR:           105 bpm. Exam Location:  Outpatient  Procedure: 2D Echo, Cardiac Doppler and Color Doppler  Indications:     Atrial Fibrillation I48.91  History:        Patient has no prior history of Echocardiogram examinations. Signs/Symptoms:Murmur; Risk Factors:Dyslipidemia and Non-Smoker.  Sonographer:    Recardo Hedge RDCS Referring Phys: [id number] BRIDGETTE CHRISTOPHER  IMPRESSIONS   1. Left ventricular ejection fraction, by estimation, is 50 to 55%. The left ventricle has normal function. The left ventricle has no regional wall motion abnormalities. Diastolic function is indeterminant due to atrial fibrillation. 2. Right ventricular systolic function is normal. The right ventricular size is normal. There is normal pulmonary artery systolic pressure. The estimated right ventricular systolic pressure is 24.7 mmHg. 3. Left atrial size was moderately dilated. 4. Right atrial size was severely dilated. 5. The mitral valve is degenerative. Trivial mitral valve regurgitation. 6. There is moderate, functional tricuspid regurgitation. 7. The aortic valve is tricuspid. Aortic valve regurgitation is mild. 8. The inferior vena cava is normal in size with greater than 50% respiratory variability, suggesting right atrial pressure of 3 mmHg.  Comparison(s): No prior Echocardiogram.  FINDINGS Left Ventricle: Left ventricular ejection fraction, by estimation, is 50 to 55%. The left ventricle has normal function. The left ventricle has no regional wall motion abnormalities. The left ventricular internal cavity size was  normal in size. There is no left ventricular hypertrophy. Diastolic function is indeterminant due to atrial fibrillation.  Right Ventricle: The right ventricular size is normal. No increase in right ventricular wall thickness. Right ventricular systolic function is normal. There is normal pulmonary artery systolic pressure. The tricuspid regurgitant velocity is 2.33 m/s, and with an assumed right atrial pressure of 3 mmHg, the estimated right ventricular systolic pressure is 24.7 mmHg.  Left Atrium: Left  atrial size was moderately dilated.  Right Atrium: Right atrial size was severely dilated.  Pericardium: There is no evidence of pericardial effusion.  Mitral Valve: The mitral valve is degenerative in appearance. There is mild thickening of the mitral valve leaflet(s). There is mild calcification of the mitral valve leaflet(s). Mild mitral annular calcification. Trivial mitral valve regurgitation.  Tricuspid Valve: The tricuspid valve is normal in structure. Tricuspid valve regurgitation is moderate.  Aortic Valve: The aortic valve is tricuspid. Aortic valve regurgitation is mild. Aortic regurgitation PHT measures 455 msec.  Pulmonic Valve: The pulmonic valve was normal in structure. Pulmonic valve regurgitation is trivial.  Aorta: The aortic root and ascending aorta are structurally normal, with no evidence of dilitation.  Venous: The inferior vena cava is normal in size with greater than 50% respiratory variability, suggesting right atrial pressure of 3 mmHg.  IAS/Shunts: No atrial level shunt detected by color flow Doppler.   LEFT VENTRICLE PLAX 2D LVIDd:         4.70 cm LVIDs:         3.30 cm LV PW:         0.60 cm LV IVS:        0.50 cm LVOT diam:     1.90 cm LV SV:         37 LV SV Index:   26 LVOT Area:     2.84 cm  LV Volumes (MOD) LV vol d, MOD A2C: 69.7 ml LV vol d, MOD A4C: 76.2 ml LV vol s, MOD A2C: 39.2 ml LV vol s, MOD A4C: 48.4 ml LV SV MOD A2C:     30.5 ml LV SV MOD A4C:     76.2 ml LV SV MOD BP:      30.1 ml  RIGHT VENTRICLE TAPSE (M-mode): 1.1 cm  LEFT ATRIUM             Index       RIGHT ATRIUM           Index LA diam:        3.30 cm 2.32 cm/m  RA Area:     29.90 cm LA Vol (A2C):   56.0 ml 39.31 ml/m RA Volume:   112.00 ml 78.62 ml/m LA Vol (A4C):   47.2 ml 33.13 ml/m LA Biplane Vol: 55.6 ml 39.03 ml/m AORTIC VALVE LVOT Vmax:   76.90 cm/s LVOT Vmean:  56.900 cm/s LVOT VTI:    0.130 m AI PHT:      455 msec  AORTA Ao Root diam: 3.30  cm Ao Asc diam:  3.40 cm  TRICUSPID VALVE TR Peak grad:   21.7 mmHg TR Vmax:        233.00 cm/s  SHUNTS Systemic VTI:  0.13 m Systemic Diam: 1.90 cm  Powell Sorrow MD Electronically signed by Powell Sorrow MD Signature Date/Time: 01/19/2021/5:18:00 PM    Final          ______________________________________________________________________________________________      Risk Assessment/Calculations  CHA2DS2-VASc Score = 3   This indicates a 3.2%  annual risk of stroke. The patient's score is based upon: CHF History: 0 HTN History: 0 Diabetes History: 0 Stroke History: 0 Vascular Disease History: 0 Age Score: 2 Gender Score: 1            Physical Exam VS:  BP 103/70   Pulse 83   Ht 5' 1 (1.549 m)   Wt 105 lb (47.6 kg)   SpO2 96%   BMI 19.84 kg/m        Wt Readings from Last 3 Encounters:  05/16/24 105 lb (47.6 kg)  03/06/24 102 lb (46.3 kg)  01/16/24 104 lb 9.6 oz (47.4 kg)    GEN: Well nourished, well developed in no acute distress NECK: No JVD; No carotid bruits CARDIAC: IRIR, no murmurs, rubs, gallops RESPIRATORY:  Clear to auscultation without rales, wheezing or rhonchi  ABDOMEN: Soft, non-tender, non-distended EXTREMITIES:  No edema; No deformity   ASSESSMENT AND PLAN  Permanent atrial fibrillation / Hypercoagulable state - rate controlled today by EKG. Unaware of her atrial fibrillation with no palpitations, dyspnea. Has opted for rate control. Continue diltiazem  120mg  daily, refills provided. CHA2DS2-VASc Score = 3 [CHF History: 0, HTN History: 0, Diabetes History: 0, Stroke History: 0, Vascular Disease History: 0, Age Score: 2, Gender Score: 1].  Therefore, the patient's annual risk of stroke is 3.2 %.    Denies bleeding complications. Continue Eliquis  2.5mg  BID, reduced dose due to age and body habitus. Update BMET/CBC.       Dispo: follow up in 1 year  Signed, Reche GORMAN Finder, NP

## 2024-05-16 NOTE — Telephone Encounter (Signed)
 Message left for patient that Dr. Geofm is out until 7/21.

## 2024-05-16 NOTE — Patient Instructions (Signed)
 Medication Instructions:   Your physician recommends that you continue on your current medications as directed. Please refer to the Current Medication list given to you today.   *If you need a refill on your cardiac medications before your next appointment, please call your pharmacy*  Lab Work:  TODAY!!!!! CMET/CBC  If you have labs (blood work) drawn today and your tests are completely normal, you will receive your results only by: MyChart Message (if you have MyChart) OR A paper copy in the mail If you have any lab test that is abnormal or we need to change your treatment, we will call you to review the results.  Testing/Procedures:  None ordered.  Follow-Up: At [facility], you and your health needs are our priority.  As part of our continuing mission to provide you with exceptional heart care, our providers are all part of one team.  This team includes your primary Cardiologist (physician) and Advanced Practice Providers or APPs (Physician Assistants and Nurse Practitioners) who all work together to provide you with the care you need, when you need it.  Your next appointment:   1 year(s)  Provider:   Shelda Bruckner, MD    We recommend signing up for the patient portal called MyChart.  Sign up information is provided on this After Visit Summary.  MyChart is used to connect with patients for Virtual Visits (Telemedicine).  Patients are able to view lab/test results, encounter notes, upcoming appointments, etc.  Non-urgent messages can be sent to your provider as well.   To learn more about what you can do with MyChart, go to [email or web address].   Other Instructions  Your physician wants you to follow-up in: 1 year.  You will receive a reminder letter in the mail two months in advance. If you don't receive a letter, please call our office to schedule the follow-up appointment.

## 2024-05-17 ENCOUNTER — Ambulatory Visit (HOSPITAL_BASED_OUTPATIENT_CLINIC_OR_DEPARTMENT_OTHER): Payer: Self-pay | Admitting: Family

## 2024-05-17 LAB — CBC
Hematocrit: 43.6 % (ref 34.0–46.6)
Hemoglobin: 14.3 g/dL (ref 11.1–15.9)
MCH: 30.5 pg (ref 26.6–33.0)
MCHC: 32.8 g/dL (ref 31.5–35.7)
MCV: 93 fL (ref 79–97)
Platelets: 170 x10E3/uL (ref 150–450)
RBC: 4.69 x10E6/uL (ref 3.77–5.28)
RDW: 12 % (ref 11.7–15.4)
WBC: 5.9 x10E3/uL (ref 3.4–10.8)

## 2024-05-17 LAB — BASIC METABOLIC PANEL WITH GFR
BUN/Creatinine Ratio: 37 — ABNORMAL HIGH (ref 12–28)
BUN: 29 mg/dL (ref 10–36)
CO2: 22 mmol/L (ref 20–29)
Calcium: 9.3 mg/dL (ref 8.7–10.3)
Chloride: 104 mmol/L (ref 96–106)
Creatinine, Ser: 0.79 mg/dL (ref 0.57–1.00)
Glucose: 88 mg/dL (ref 70–99)
Potassium: 4.5 mmol/L (ref 3.5–5.2)
Sodium: 142 mmol/L (ref 134–144)
eGFR: 71 mL/min/1.73 (ref >59)

## 2024-05-24 NOTE — Telephone Encounter (Signed)
 Referral ordered

## 2024-05-24 NOTE — Addendum Note (Signed)
 Addended by: GEOFM GLADE PARAS on: 05/24/2024 03:35 PM   Modules accepted: Orders

## 2024-06-13 DIAGNOSIS — J300 Vasomotor rhinitis: Secondary | ICD-10-CM | POA: Diagnosis not present

## 2024-06-22 ENCOUNTER — Other Ambulatory Visit: Payer: Self-pay | Admitting: Cardiology

## 2024-06-22 DIAGNOSIS — I4821 Permanent atrial fibrillation: Secondary | ICD-10-CM

## 2024-06-23 NOTE — Telephone Encounter (Signed)
 Prescription refill request for Eliquis  received. Indication: AF Last office visit: 05/16/24  JAYSON Finder NP Scr: 0.79 on 05/16/24  Epic Age: 89 Weight: 47.6kg  Based on above findings Eliquis  2.5mg  twice daily is the appropriate dose.  Refill approved.

## 2024-07-08 ENCOUNTER — Other Ambulatory Visit: Payer: Self-pay | Admitting: Cardiology

## 2024-07-08 DIAGNOSIS — I4819 Other persistent atrial fibrillation: Secondary | ICD-10-CM

## 2024-07-09 DIAGNOSIS — H26493 Other secondary cataract, bilateral: Secondary | ICD-10-CM | POA: Diagnosis not present

## 2024-07-09 DIAGNOSIS — H43813 Vitreous degeneration, bilateral: Secondary | ICD-10-CM | POA: Diagnosis not present

## 2024-07-10 ENCOUNTER — Other Ambulatory Visit: Payer: Self-pay | Admitting: Internal Medicine

## 2024-07-10 DIAGNOSIS — Z1231 Encounter for screening mammogram for malignant neoplasm of breast: Secondary | ICD-10-CM

## 2024-07-14 ENCOUNTER — Telehealth: Payer: Self-pay

## 2024-07-14 NOTE — Telephone Encounter (Signed)
 Copied from CRM [id number]. Topic: Clinical - Medication Prior Auth >> Jul 14, 2024 12:59 PM Aleatha C wrote: Reason for CRM: Patient would like a covid test called in for [id number] [place], [place] - [address] [place] [address] Phone: [phone] Fax: [phone]

## 2024-07-15 DIAGNOSIS — C44729 Squamous cell carcinoma of skin of left lower limb, including hip: Secondary | ICD-10-CM | POA: Diagnosis not present

## 2024-07-15 DIAGNOSIS — Z85828 Personal history of other malignant neoplasm of skin: Secondary | ICD-10-CM | POA: Diagnosis not present

## 2024-07-15 DIAGNOSIS — C44799 Other specified malignant neoplasm of skin of left lower limb, including hip: Secondary | ICD-10-CM | POA: Diagnosis not present

## 2024-07-19 DIAGNOSIS — Z23 Encounter for immunization: Secondary | ICD-10-CM | POA: Diagnosis not present

## 2024-07-21 DIAGNOSIS — D0472 Carcinoma in situ of skin of left lower limb, including hip: Secondary | ICD-10-CM | POA: Diagnosis not present

## 2024-07-21 DIAGNOSIS — C44729 Squamous cell carcinoma of skin of left lower limb, including hip: Secondary | ICD-10-CM | POA: Diagnosis not present

## 2024-07-21 DIAGNOSIS — Z85828 Personal history of other malignant neoplasm of skin: Secondary | ICD-10-CM | POA: Diagnosis not present

## 2024-07-29 DIAGNOSIS — Z85828 Personal history of other malignant neoplasm of skin: Secondary | ICD-10-CM | POA: Diagnosis not present

## 2024-07-29 DIAGNOSIS — L821 Other seborrheic keratosis: Secondary | ICD-10-CM | POA: Diagnosis not present

## 2024-07-29 DIAGNOSIS — L570 Actinic keratosis: Secondary | ICD-10-CM | POA: Diagnosis not present

## 2024-07-29 DIAGNOSIS — C44722 Squamous cell carcinoma of skin of right lower limb, including hip: Secondary | ICD-10-CM | POA: Diagnosis not present

## 2024-07-29 DIAGNOSIS — L820 Inflamed seborrheic keratosis: Secondary | ICD-10-CM | POA: Diagnosis not present

## 2024-07-30 ENCOUNTER — Telehealth: Payer: Self-pay

## 2024-07-30 DIAGNOSIS — J329 Chronic sinusitis, unspecified: Secondary | ICD-10-CM

## 2024-07-30 NOTE — Telephone Encounter (Signed)
 Copied from CRM #[id number]. Topic: Referral - Request for Referral >> Jul 30, 2024  1:04 PM Paige D wrote: Did the patient discuss referral with their provider in the last year? Yes (If No - schedule appointment) (If Yes - send message)  Appointment offered? Yes  Type of order/referral and detailed reason for visit: ENT  Preference of office, provider, location: No preference   If referral order, have you been seen by this specialty before? Yes 10 years ago  (If Yes, this issue or another issue? When? Where?  Can we respond through MyChart? No, would prefer a phone call.

## 2024-07-31 NOTE — Telephone Encounter (Signed)
 ENT referral ordered.

## 2024-08-11 ENCOUNTER — Encounter (INDEPENDENT_AMBULATORY_CARE_PROVIDER_SITE_OTHER): Payer: Self-pay

## 2024-08-11 ENCOUNTER — Ambulatory Visit (INDEPENDENT_AMBULATORY_CARE_PROVIDER_SITE_OTHER)

## 2024-08-11 VITALS — BP 138/87 | HR 90 | Temp 98.0°F | Ht 61.0 in | Wt 103.0 lb

## 2024-08-11 DIAGNOSIS — J3489 Other specified disorders of nose and nasal sinuses: Secondary | ICD-10-CM | POA: Diagnosis not present

## 2024-08-11 DIAGNOSIS — J329 Chronic sinusitis, unspecified: Secondary | ICD-10-CM

## 2024-08-11 DIAGNOSIS — R0982 Postnasal drip: Secondary | ICD-10-CM

## 2024-08-11 DIAGNOSIS — J0101 Acute recurrent maxillary sinusitis: Secondary | ICD-10-CM

## 2024-08-11 DIAGNOSIS — J342 Deviated nasal septum: Secondary | ICD-10-CM | POA: Diagnosis not present

## 2024-08-11 MED ORDER — MOMETASONE FUROATE 50 MCG/ACT NA SUSP: 2.0000 | Freq: Every day | NASAL | 12 refills | Status: AC

## 2024-08-11 MED ORDER — AMOXICILLIN-POT CLAVULANATE 875-125 MG PO TABS: 1.0000 | ORAL_TABLET | Freq: Two times a day (BID) | ORAL | 0 refills | Status: AC

## 2024-08-11 NOTE — Progress Notes (Signed)
 HPI:   Danielle Mcgee is a 89 y.o. female who presents as a new patient being seen in consultation for chronic sinusitis. Patient states that she has had issues with her sinuses for as long as she can remember. She states that she has tried everything OTC without relief. She states she has facial pressure in her cheeks and her head in general and states it feels worse on the left than the right. States that her ears feel clogged due to the pressure. She did have sinus surgery in 2016 after which she did have relief of her symptoms for some time but states that it all came back. She has not used nasal saline rinses or any nasal sprays for several years as she states these did not work for her. She has tried Flonase  without relief. She has tried nasal saline rinses but states that it has been quite some time. She states she is currently having green mucoid drainage from her nose. She states that she has constant post-nasal drainage that feels like it is primarily coming from the left side of her nose. She denies any fever or chills. States this colored drainage has been present for more than 2 weeks. She has not been on any antibiotics for her current sinus infection. Patient states she did see and allergist and underwent allergic testing which revealed no response to any allergens.    PMH/Meds/All/SocHx/FamHx/ROS: Past Medical History:  Diagnosis Date   Benign breast disease 2000   DJD (degenerative joint disease)    Generalized headaches    Heart murmur    Hyperlipidemia    Mitral click-murmur syndrome    nomitral  reguritation   Osteopenia    Temporal arteritis (HCC)    2013-14   Past Surgical History:  Procedure Laterality Date   APPENDECTOMY  1962   ARTERY BIOPSY  09/20/2011   Procedure: BIOPSY TEMPORAL ARTERY;  Surgeon: Elon CHRISTELLA Pacini, MD;  Location: [facility] OR;  Service: General;  Laterality: Left;   BREAST BIOPSY  2000   Atypical Lobular Hyperplasia   CARDIOVERSION N/A 02/11/2021    Procedure: CARDIOVERSION;  Surgeon: Lonni Slain, MD;  Location: [facility] ENDOSCOPY;  Service: Cardiovascular;  Laterality: N/A;   CATARACT EXTRACTION     bilaterally   COLONOSCOPY  2002 & 2013   Dr Obie   ELBOW SURGERY  2009   lt fx   ENDOSCOPIC TURBINATE REDUCTION Bilateral 10/06/2015   Procedure: ENDOSCOPIC TURBINATE REDUCTION;  Surgeon: Alm Bouche, MD;  Location: [facility] OR;  Service: ENT;  Laterality: Bilateral;   EYE SURGERY     FRACTURE SURGERY Bilateral    wrist   JOINT REPLACEMENT  2001   both-knees   SINUS ENDO W/FUSION Left 10/06/2015   Procedure: LEFT ENDOSCOPIC SINUS SURGERY WITH MAXILLARY ANTROSTOMY AND REMOVAL OF DISEASED TISSUE ;  Surgeon: Alm Bouche, MD;  Location: [facility] OR;  Service: ENT;  Laterality: Left;   TOTAL KNEE ARTHROPLASTY     bilaterally   No family history of bleeding disorders, wound healing problems or difficulty with anesthesia.  Social Connections: Socially Integrated (09/13/2023)   Social Connection and Isolation Panel    Frequency of Communication with Friends and Family: More than three times a week    Frequency of Social Gatherings with Friends and Family: More than three times a week    Attends Religious Services: More than 4 times per year    Active Member of [employer] or Organizations: Yes    Attends Banker Meetings: More than  4 times per year    Marital Status: Married    Current Outpatient Medications:    apixaban  (ELIQUIS ) 2.5 MG TABS tablet, TAKE 1 TABLET TWICE A DAY, Disp: 180 tablet, Rfl: 1   Calcium -Vitamin D -Vitamin K (CALCIUM  + D) [phone] MG-UNT-MCG CHEW, Chew 1 tablet by mouth daily., Disp: , Rfl:    diltiazem  (CARDIZEM  CD) 120 MG 24 hr capsule, TAKE 1 CAPSULE DAILY, Disp: 90 capsule, Rfl: 3   Multiple Vitamins-Minerals (CENTRUM SILVER ) tablet, Take 1 tablet by mouth daily., Disp: , Rfl:  A complete ROS was performed with pertinent positives/negatives noted in the HPI. The remainder of the ROS are negative.    Physical Exam:  BP 138/87 (BP Location: Left Arm)   Pulse 90   Temp 98 F (36.7 C)   Ht 5' 1 (1.549 m)   Wt 103 lb (46.7 kg)   SpO2 95%   BMI 19.46 kg/m  General: Well developed, well nourished. No acute distress. Voice normal Head/Face: Normocephalic. No sinus tenderness. Facial nerve intact and equal bilaterally. No facial lacerations. Eyes: PERRL, no scleral icterus or conjunctival hemorrhage. EOMI. Ears: No gross deformity. Normal external canal. Tympanic membrane in tact bilaterally Hearing: Normal speech reception.  Nose: No gross deformity or lesions. Left purulent discharge. No turbinate hypertrophy. Significant left septal deviation Mouth/Oropharynx: Lips without any lesions. Dentition good. No mucosal lesions within the oropharynx. No tonsillar enlargement, exudate, or lesions. Pharyngeal walls symmetrical. Uvula midline. Tongue midline without lesions. Larynx: See TFL if applicable Nasopharynx: See TFL if applicable Neck: Trachea midline. No masses. No thyromegaly or nodules palpated. No crepitus. Lymphatic: No lymphadenopathy in the neck. Respiratory: No stridor or distress. Room air. Cardiovascular: Regular rate and rhythm. Extremities: No edema or cyanosis. Warm and well-perfused. Skin: No scars or lesions on face or neck. Neurologic: CN II-XII grossly intact. Moving all extremities without gross abnormality. Other:  Independent Review of Additional Tests or Records: 10/06/2015 Operative Report - Left endoscopic sinus surgery, maxillary antrostomy, anterior ethmoidectomy, bilateral inferior turbinate reduction  Procedures: Bilateral Diagnostic Nasal Endoscopy  Pre-procedure diagnosis: Concern for sinusitis Post-procedure diagnosis: same Indication: See pre-procedure diagnosis and physical exam above Complications: None apparent Anesthesia: Lidocaine  4% and topical decongestant was topically sprayed in each nasal cavity  Description of Procedure:  Patient was  identified. A flexible fiberoptic scope was utilized to evaluate the sinonasal cavities, mucosa, sinus ostia and turbinates and septum.  Overall, signs of mucosal inflammation are noted primarily in the left nasal cavity.  Also noted is a significant left septal deviation and bright green debris emanating from the left middle meatus, further evaluation of the left maxillary sinus is limited due to the significant debris within the meatus.  No polyps, or masses noted.    Right Middle meatus: no purulent drainage, no polyps Right SE Recess: no purulent drainage, no polyps Left MM: bright green purulent debris obstructing the middle meatus, large middle turbinate Left SE Recess: no purulent drainage, no polyps  CPT CODE -- 68768 - Mod 25    Impression & Plans: Danielle Mcgee is a 89 y.o. female with chronic sinusitis with recent acute exacerbation.   Unilateral acute bacterial sinusitis - Start Augmentin  BID x 10 days - Saline rinses daily - Nasonex  daily - CT sinus ordered  Chronic sinusitis - chronic nasal obstruction 2/2 nasal septal deviation - hx of endoscopic sinus surgery  Nasal septal deviation, left - likely contributing to recurrent sinusitis  Follow-up in 4-6 weeks, will repeat scope at that time  and discuss results of CT sinus.   Adah Malkin, DO [facility]

## 2024-08-22 ENCOUNTER — Ambulatory Visit
Admission: RE | Admit: 2024-08-22 | Discharge: 2024-08-22 | Disposition: A | Source: Ambulatory Visit | Attending: Internal Medicine

## 2024-08-22 DIAGNOSIS — Z1231 Encounter for screening mammogram for malignant neoplasm of breast: Secondary | ICD-10-CM

## 2024-09-15 ENCOUNTER — Ambulatory Visit (INDEPENDENT_AMBULATORY_CARE_PROVIDER_SITE_OTHER)

## 2024-09-15 ENCOUNTER — Encounter (INDEPENDENT_AMBULATORY_CARE_PROVIDER_SITE_OTHER): Payer: Self-pay

## 2024-09-15 VITALS — BP 137/69 | HR 82 | Temp 98.0°F | Wt 112.0 lb

## 2024-09-15 DIAGNOSIS — J342 Deviated nasal septum: Secondary | ICD-10-CM | POA: Diagnosis not present

## 2024-09-15 DIAGNOSIS — J329 Chronic sinusitis, unspecified: Secondary | ICD-10-CM

## 2024-09-15 DIAGNOSIS — R0982 Postnasal drip: Secondary | ICD-10-CM

## 2024-09-15 NOTE — Progress Notes (Signed)
 HPI:  08/11/2024  Danielle Mcgee is a 89 y.o. female who presents as a new patient being seen in consultation for chronic sinusitis. Patient states that she has had issues with her sinuses for as long as she can remember. She states that she has tried everything OTC without relief. She states she has facial pressure in her cheeks and her head in general and states it feels worse on the left than the right. States that her ears feel clogged due to the pressure. She did have sinus surgery in 2016 after which she did have relief of her symptoms for some time but states that it all came back. She has not used nasal saline rinses or any nasal sprays for several years as she states these did not work for her. She has tried Flonase  without relief. She has tried nasal saline rinses but states that it has been quite some time. She states she is currently having green mucoid drainage from her nose. She states that she has constant post-nasal drainage that feels like it is primarily coming from the left side of her nose. She denies any fever or chills. States this colored drainage has been present for more than 2 weeks. She has not been on any antibiotics for her current sinus infection. Patient states she did see and allergist and underwent allergic testing which revealed no response to any allergens.   Today (09/15/2024) Patient returns today regarding her symptoms of chronic sinusitis. She states that medical management has had minimal improvement. She has stopped doing sinus rinses as she does not feel they help. Patient states that she has considered surgical intervention and that she would like to defer surgery at this time given her age.   PMH/Meds/All/SocHx/FamHx/ROS: Past Medical History:  Diagnosis Date   Benign breast disease 2000   DJD (degenerative joint disease)    Generalized headaches    Heart murmur    Hyperlipidemia    Mitral click-murmur syndrome    nomitral  reguritation   Osteopenia     Temporal arteritis (HCC)    2013-14   Past Surgical History:  Procedure Laterality Date   APPENDECTOMY  1962   ARTERY BIOPSY  09/20/2011   Procedure: BIOPSY TEMPORAL ARTERY;  Surgeon: Elon CHRISTELLA Pacini, MD;  Location: [facility] OR;  Service: General;  Laterality: Left;   BREAST BIOPSY  2000   Atypical Lobular Hyperplasia   CARDIOVERSION N/A 02/11/2021   Procedure: CARDIOVERSION;  Surgeon: Lonni Slain, MD;  [facility] ENDOSCOPY;  Service: Cardiovascular;  Laterality: N/A;   CATARACT EXTRACTION     bilaterally   COLONOSCOPY  2002 & 2013   Dr Obie   ELBOW SURGERY  2009   lt fx   ENDOSCOPIC TURBINATE REDUCTION Bilateral 10/06/2015   Procedure: ENDOSCOPIC TURBINATE REDUCTION;  Surgeon: Alm Bouche, MD;  Location: [facility] OR;  Service: ENT;  Laterality: Bilateral;   EYE SURGERY     FRACTURE SURGERY Bilateral    wrist   JOINT REPLACEMENT  2001   both-knees   SINUS ENDO W/FUSION Left 10/06/2015   Procedure: LEFT ENDOSCOPIC SINUS SURGERY WITH MAXILLARY ANTROSTOMY AND REMOVAL OF DISEASED TISSUE ;  Surgeon: Alm Bouche, MD;  Location: [facility] OR;  Service: ENT;  Laterality: Left;   TOTAL KNEE ARTHROPLASTY     bilaterally   No family history of bleeding disorders, wound healing problems or difficulty with anesthesia.  Social Connections: Socially Integrated (09/13/2023)   Social Connection and Isolation Panel    Frequency of Communication with Friends  and Family: More than three times a week    Frequency of Social Gatherings with Friends and Family: More than three times a week    Attends Religious Services: More than 4 times per year    Active Member of Clubs or Organizations: Yes    Attends Engineer, Structural: More than 4 times per year    Marital Status: Married    Current Outpatient Medications:    apixaban  (ELIQUIS ) 2.5 MG TABS tablet, TAKE 1 TABLET TWICE A DAY, Disp: 180 tablet, Rfl: 1   Calcium -Vitamin D -Vitamin K (CALCIUM  + D) [phone] MG-UNT-MCG CHEW, Chew 1  tablet by mouth daily., Disp: , Rfl:    diltiazem  (CARDIZEM  CD) 120 MG 24 hr capsule, TAKE 1 CAPSULE DAILY, Disp: 90 capsule, Rfl: 3   mometasone  (NASONEX ) 50 MCG/ACT nasal spray, Place 2 sprays into the nose daily., Disp: 1 each, Rfl: 12   Multiple Vitamins-Minerals (CENTRUM SILVER ) tablet, Take 1 tablet by mouth daily., Disp: , Rfl:  A complete ROS was performed with pertinent positives/negatives noted in the HPI. The remainder of the ROS are negative.   Physical Exam:  BP 137/69 (BP Location: Right Arm, Patient Position: Sitting, Cuff Size: Normal)   Pulse 82   Temp 98 F (36.7 C)   Wt 112 lb (50.8 kg)   SpO2 96%   BMI 21.16 kg/m  General: Well developed, well nourished. No acute distress. Voice normal Head/Face: Normocephalic. No sinus tenderness. Facial nerve intact and equal bilaterally. No facial lacerations. Eyes: PERRL, no scleral icterus or conjunctival hemorrhage. EOMI. Ears: No gross deformity. Normal external canal. Tympanic membrane in tact bilaterally Hearing: Normal speech reception.  Nose: No gross deformity or lesions. Left mucoid discharge. Bilateral inferior turbinate hypertrophy. Significant left septal deviation with right septal spur noted Mouth/Oropharynx: Lips without any lesions. Dentition good. No mucosal lesions within the oropharynx. No tonsillar enlargement, exudate, or lesions. Pharyngeal walls symmetrical. Uvula midline. Tongue midline without lesions. Larynx: See TFL if applicable Nasopharynx: See TFL if applicable Neck: Trachea midline. No masses. No thyromegaly or nodules palpated. No crepitus. Lymphatic: No lymphadenopathy in the neck. Respiratory: No stridor or distress. Room air. Cardiovascular: Regular rate and rhythm. Extremities: No edema or cyanosis. Warm and well-perfused. Skin: No scars or lesions on face or neck. Neurologic: CN II-XII grossly intact. Moving all extremities without gross abnormality. Other:  Independent Review of Additional  Tests or Records: 10/06/2015 Operative Report - Left endoscopic sinus surgery, maxillary antrostomy, anterior ethmoidectomy, bilateral inferior turbinate reduction  Procedures: Bilateral Diagnostic Nasal Endoscopy  Pre-procedure diagnosis: Concern for sinusitis Post-procedure diagnosis: same Indication: See pre-procedure diagnosis and physical exam above Complications: None apparent Anesthesia: Lidocaine  4% and topical decongestant was topically sprayed in each nasal cavity  Description of Procedure:  Patient was identified. A flexible fiberoptic scope was utilized to evaluate the sinonasal cavities, mucosa, sinus ostia and turbinates and septum.  Overall, signs of mucosal inflammation are noted to have interval improvement compared to previous scope exam.  Also noted is a significant left septal deviation and right septal spur. There is mucoid drainage noted from the left middle meatus.  No polyps, or masses noted.    Right Middle meatus: no purulent drainage, no polyps Right SE Recess: no purulent drainage, no polyps Left MM: mucoid drainage from the middle meatus, large middle turbinate Left SE Recess: no purulent drainage, no polyps  CPT CODE -- 68768 - Mod 25    Impression & Plans: Danielle Mcgee is a 89 y.o. female  with chronic sinusitis with recurrent exacerbations.   Unilateral chronic sinusitis, left side - Discussed surgical management with patient, she does not want to undergo surgery at this time due to age and risk of complications. Discussed with patient the need to maintain medical management and continue nasal rinses and sprays in order to reduce chances of acute exacerbations - Saline rinses daily - Nasonex  daily - CT sinus ordered - patient states she is not interested in pursuing this further at this time  Chronic sinusitis - chronic nasal obstruction 2/2 nasal septal deviation - hx of endoscopic sinus surgery  Nasal septal deviation, left - likely  contributing to recurrent sinusitis  Follow-up as needed.  Adah Malkin, DO [facility]

## 2024-09-16 ENCOUNTER — Ambulatory Visit: Payer: Medicare Other

## 2024-09-16 VITALS — BP 140/68 | HR 64 | Ht 60.0 in | Wt 103.4 lb

## 2024-09-16 DIAGNOSIS — Z0000 Encounter for general adult medical examination without abnormal findings: Secondary | ICD-10-CM

## 2024-09-16 DIAGNOSIS — M810 Age-related osteoporosis without current pathological fracture: Secondary | ICD-10-CM | POA: Diagnosis not present

## 2024-09-16 NOTE — Patient Instructions (Addendum)
 Danielle Mcgee,  Thank you for taking the time for your Medicare Wellness Visit. I appreciate your continued commitment to your health goals. Please review the care plan we discussed, and feel free to reach out if I can assist you further.  Please note that Annual Wellness Visits do not include a physical exam. Some assessments may be limited, especially if the visit was conducted virtually. If needed, we may recommend an in-person follow-up with your provider.  Ongoing Care Seeing your primary care provider every 3 to 6 months helps us  monitor your health and provide consistent, personalized care.   Referrals If a referral was made during today's visit and you haven't received any updates within two weeks, please contact the referred provider directly to check on the status.  Recommended Screenings:  Health Maintenance  Topic Date Due   DEXA scan (bone density measurement)  07/20/2021   COVID-19 Vaccine (6 - 2025-26 season) 07/07/2024   DTaP/Tdap/Td vaccine (3 - Td or Tdap) 09/02/2025   Medicare Annual Wellness Visit  09/16/2025   Pneumococcal Vaccine for age over 81  Completed   Flu Shot  Completed   Meningitis B Vaccine  Aged Out   Zoster (Shingles) Vaccine  Discontinued       09/16/2024    1:58 PM  Advanced Directives  Does Patient Have a Medical Advance Directive? Yes  Type of Estate Agent of [place];Living will  Does patient want to make changes to medical advance directive? Yes (Inpatient - patient requests chaplain consult to change a medical advance directive)  Copy of Healthcare Power of Attorney in Chart? No - copy requested    Vision: Annual vision screenings are recommended for early detection of glaucoma, cataracts, and diabetic retinopathy. These exams can also reveal signs of chronic conditions such as diabetes and high blood pressure.  Dental: Annual dental screenings help detect early signs of oral cancer, gum disease, and other conditions  linked to overall health, including heart disease and diabetes.

## 2024-09-16 NOTE — Progress Notes (Signed)
 Subjective:   Danielle Mcgee is a 89 y.o. female who presents for a Medicare Annual Wellness Visit.  Allergies (verified) Codeine   History: Past Medical History:  Diagnosis Date   Benign breast disease 2000   DJD (degenerative joint disease)    Generalized headaches    Heart murmur    Hyperlipidemia    Mitral click-murmur syndrome    nomitral  reguritation   Osteopenia    Temporal arteritis (HCC)    2013-14   Past Surgical History:  Procedure Laterality Date   APPENDECTOMY  1962   ARTERY BIOPSY  09/20/2011   Procedure: BIOPSY TEMPORAL ARTERY;  Surgeon: Elon CHRISTELLA Pacini, MD;  Location: [facility] OR;  Service: General;  Laterality: Left;   BREAST BIOPSY  2000   Atypical Lobular Hyperplasia   CARDIOVERSION N/A 02/11/2021   Procedure: CARDIOVERSION;  Surgeon: Lonni Slain, MD;  Location: [facility] ENDOSCOPY;  Service: Cardiovascular;  Laterality: N/A;   CATARACT EXTRACTION     bilaterally   COLONOSCOPY  2002 & 2013   Dr Obie   ELBOW SURGERY  2009   lt fx   ENDOSCOPIC TURBINATE REDUCTION Bilateral 10/06/2015   Procedure: ENDOSCOPIC TURBINATE REDUCTION;  Surgeon: Alm Bouche, MD;  Location: [facility] OR;  Service: ENT;  Laterality: Bilateral;   EYE SURGERY     FRACTURE SURGERY Bilateral    wrist   JOINT REPLACEMENT  2001   both-knees   SINUS ENDO W/FUSION Left 10/06/2015   Procedure: LEFT ENDOSCOPIC SINUS SURGERY WITH MAXILLARY ANTROSTOMY AND REMOVAL OF DISEASED TISSUE ;  Surgeon: Alm Bouche, MD;  Location: [facility] OR;  Service: ENT;  Laterality: Left;   TOTAL KNEE ARTHROPLASTY     bilaterally   Family History  Problem Relation Age of Onset   Breast cancer Mother    Heart attack Mother        early 67s   Osteoporosis Mother    Diabetes Mother    Heart attack Father 59   Heart disease Sister        CABG in late 81s   Cancer Sister        CERVICAL   Diabetes Sister    Diabetes Maternal Aunt        X2   Diabetes Maternal Grandmother    Stroke Neg Hx    Social  History   Occupational History   Occupation: Retired  Tobacco Use   Smoking status: Never   Smokeless tobacco: Never  Vaping Use   Vaping status: Never Used  Substance and Sexual Activity   Alcohol use: Yes    Alcohol/week: 7.0 standard drinks of alcohol    Types: 7 Glasses of wine per week    Comment: Wine at dinner   Drug use: No   Sexual activity: Yes   Tobacco Counseling Counseling given: Not Answered  SDOH Screenings   Food Insecurity: No Food Insecurity (09/16/2024)  Housing: Unknown (09/16/2024)  Transportation Needs: No Transportation Needs (09/16/2024)  Utilities: Not At Risk (09/16/2024)  Alcohol Screen: Low Risk  (09/13/2023)  Depression (PHQ2-9): Low Risk  (09/16/2024)  Financial Resource Strain: Low Risk  (09/13/2023)  Physical Activity: Inactive (09/16/2024)  Social Connections: Socially Integrated (09/16/2024)  Stress: No Stress Concern Present (09/16/2024)  Tobacco Use: Low Risk  (09/16/2024)  Health Literacy: Adequate Health Literacy (09/16/2024)   Depression Screen    09/16/2024    2:05 PM 01/16/2024    2:45 PM 09/13/2023    3:29 PM 03/05/2023    2:13 PM 09/14/2022  11:09 AM 02/08/2022   10:05 AM 01/12/2021    1:30 PM  PHQ 2/9 Scores  PHQ - 2 Score 0 0 0 0 0 0 0  PHQ- 9 Score 0  0  0         Data saved with a previous flowsheet row definition     Goals Addressed               This Visit's Progress     Patient Stated (pt-stated)        Patient stated she plans to continue stay active       Visit info / Clinical Intake: Medicare Wellness Visit Type:: Subsequent Annual Wellness Visit Persons participating in visit:: patient Medicare Wellness Visit Mode:: In-person (required for WTM) Information given by:: patient Interpreter Needed?: No Pre-visit prep was completed: yes AWV questionnaire completed by patient prior to visit?: no Living arrangements:: lives with spouse/significant other Patient's Overall Health Status Rating: very  good Typical amount of pain: none Does pain affect daily life?: no Are you currently prescribed opioids?: no  Dietary Habits and Nutritional Risks How many meals a day?: 3 Eats fruit and vegetables daily?: yes Most meals are obtained by: preparing own meals In the last 2 weeks, have you had any of the following?: none Diabetic:: no  Functional Status Activities of Daily Living (to include ambulation/medication): Independent Ambulation: Independent with device- listed below Home Assistive Devices/Equipment: Eyeglasses Medication Administration: Independent Home Management: Independent Manage your own finances?: yes Primary transportation is: driving Concerns about vision?: no *vision screening is required for WTM* Concerns about hearing?: no  Fall Screening Falls in the past year?: 0 Number of falls in past year: 0 Was there an injury with Fall?: 0 Fall Risk Category Calculator: 0 Patient Fall Risk Level: Low Fall Risk  Fall Risk Patient at Risk for Falls Due to: No Fall Risks Fall risk Follow up: Falls evaluation completed; Falls prevention discussed  Home and Transportation Safety: All rugs have non-skid backing?: yes All stairs or steps have railings?: yes Grab bars in the bathtub or shower?: yes Have non-skid surface in bathtub or shower?: yes Good home lighting?: yes Regular seat belt use?: yes Hospital stays in the last year:: no  Cognitive Assessment Difficulty concentrating, remembering, or making decisions? : no Will 6CIT or Mini Cog be Completed: yes What year is it?: 0 points What month is it?: 0 points Give patient an address phrase to remember (5 components): [id number] [place], [place] About what time is it?: 0 points Count backwards from 20 to 1: 0 points Say the months of the year in reverse: 0 points Repeat the address phrase from earlier: 2 points (Drive) 6 CIT Score: 2 points  Advance Directives (For Healthcare) Does Patient Have a Medical  Advance Directive?: Yes Does patient want to make changes to medical advance directive?: Yes (Inpatient - patient requests chaplain consult to change a medical advance directive) Type of Advance Directive: Healthcare Power of [place]; Living will Copy of Healthcare Power of Attorney in Chart?: No - copy requested Copy of Living Will in Chart?: No - copy requested  Reviewed/Updated  Reviewed/Updated: Reviewed All (Medical, Surgical, Family, Medications, Allergies, Care Teams, Patient Goals)        Objective:    Today's Vitals   09/16/24 1357  BP: (!) 140/68  Pulse: 64  SpO2: 97%  Weight: 103 lb 6.4 oz (46.9 kg)  Height: 5' (1.524 m)   Body mass index is 20.19 kg/m.  Current  Medications (verified) Outpatient Encounter Medications as of 09/16/2024  Medication Sig   apixaban  (ELIQUIS ) 2.5 MG TABS tablet TAKE 1 TABLET TWICE A DAY   Calcium -Vitamin D -Vitamin K (CALCIUM  + D) [phone] MG-UNT-MCG CHEW Chew 1 tablet by mouth daily.   diltiazem  (CARDIZEM  CD) 120 MG 24 hr capsule TAKE 1 CAPSULE DAILY   mometasone  (NASONEX ) 50 MCG/ACT nasal spray Place 2 sprays into the nose daily.   Multiple Vitamins-Minerals (CENTRUM SILVER ) tablet Take 1 tablet by mouth daily.   No facility-administered encounter medications on file as of 09/16/2024.   Hearing/Vision screen Hearing Screening - Comments:: Denies hearing difficulties   Vision Screening - Comments:: Wears rx glasses - up to date with routine eye exams with Arlena Reusing Immunizations and Health Maintenance Health Maintenance  Topic Date Due   DEXA SCAN  07/20/2021   COVID-19 Vaccine (6 - 2025-26 season) 07/07/2024   DTaP/Tdap/Td (3 - Td or Tdap) 09/02/2025   Medicare Annual Wellness (AWV)  09/16/2025   Pneumococcal Vaccine: 50+ Years  Completed   Influenza Vaccine  Completed   Meningococcal B Vaccine  Aged Out   Zoster Vaccines- Shingrix  Discontinued        Assessment/Plan:  This is a routine wellness examination for  Danielle Mcgee.  Patient Care Team: Geofm Glade PARAS, MD as PCP - General (Internal Medicine) Lonni Slain, MD as PCP - Cardiology (Cardiology) Reusing Arlena, MD as Consulting Physician (Ophthalmology)  I have personally reviewed and noted the following in the patient's chart:   Medical and social history Use of alcohol, tobacco or illicit drugs  Current medications and supplements including opioid prescriptions. Functional ability and status Nutritional status Physical activity Advanced directives List of other physicians Hospitalizations, surgeries, and ER visits in previous 12 months Vitals Screenings to include cognitive, depression, and falls Referrals and appointments  Orders Placed This Encounter  Procedures   DG Bone Density    Standing Status:   Future    Expiration Date:   09/16/2025    Reason for Exam (SYMPTOM  OR DIAGNOSIS REQUIRED):   post menopausal estrogen deficient    Preferred imaging location?:   [address]   In addition, I have reviewed and discussed with patient certain preventive protocols, quality metrics, and best practice recommendations. A written personalized care plan for preventive services as well as general preventive health recommendations were provided to patient.   Danielle Mcgee, CMA   09/16/2024   Return in 1 year (on 09/16/2025).  After Visit Summary: (In Person-Declined) Patient declined AVS at this time.  Nurse Notes: Ordered DEXA Scan; Scheduled 2026 AWV/CPE appts.

## 2025-09-22 ENCOUNTER — Encounter: Admitting: Internal Medicine

## 2025-09-22 ENCOUNTER — Ambulatory Visit
